# Patient Record
Sex: Female | Born: 1950 | Race: White | Hispanic: No | Marital: Married | Smoking: Never smoker
Health system: Southern US, Community
[De-identification: ages and names within clinical notes are randomized; demographics above are authoritative.]

## PROBLEM LIST (undated history)

## (undated) DIAGNOSIS — E7800 Pure hypercholesterolemia, unspecified: Secondary | ICD-10-CM

## (undated) DIAGNOSIS — I10 Essential (primary) hypertension: Secondary | ICD-10-CM

## (undated) DIAGNOSIS — Z9289 Personal history of other medical treatment: Secondary | ICD-10-CM

## (undated) DIAGNOSIS — E785 Hyperlipidemia, unspecified: Secondary | ICD-10-CM

## (undated) DIAGNOSIS — C801 Malignant (primary) neoplasm, unspecified: Secondary | ICD-10-CM

## (undated) DIAGNOSIS — I739 Peripheral vascular disease, unspecified: Secondary | ICD-10-CM

## (undated) HISTORY — PX: DILATION AND CURETTAGE OF UTERUS: SHX78

## (undated) HISTORY — DX: Essential (primary) hypertension: I10

## (undated) HISTORY — PX: COLONOSCOPY: SHX174

## (undated) HISTORY — DX: Hyperlipidemia, unspecified: E78.5

## (undated) HISTORY — PX: CATARACT EXTRACTION: SUR2

## (undated) HISTORY — DX: Pure hypercholesterolemia, unspecified: E78.00

---

## 2009-10-10 ENCOUNTER — Other Ambulatory Visit: Admission: RE | Admit: 2009-10-10 | Discharge: 2009-10-10 | Payer: Self-pay | Admitting: Family Medicine

## 2010-11-19 ENCOUNTER — Encounter (INDEPENDENT_AMBULATORY_CARE_PROVIDER_SITE_OTHER): Payer: Self-pay | Admitting: Surgery

## 2010-11-19 ENCOUNTER — Ambulatory Visit (INDEPENDENT_AMBULATORY_CARE_PROVIDER_SITE_OTHER): Payer: Medicare HMO | Admitting: Surgery

## 2010-11-19 DIAGNOSIS — R599 Enlarged lymph nodes, unspecified: Secondary | ICD-10-CM

## 2010-11-19 NOTE — Progress Notes (Signed)
Subjective:     Patient ID: Katie Hawkins, female   DOB: 1951/04/05, 60 y.o.   MRN: [id number]    BP 178/117  Pulse 120  Temp 97 F (36.1 C)  Ht 5\' 3"  (1.6 m)  Wt 193 lb 12.8 oz (87.907 kg)  BMI 34.33 kg/m2  HPI Patient is a 60 year old white female. She presents at the request of Dr. Elige Radon and [name] for left axillary lymphadenopathy. Patient had undergone a routine screening mammogram on June 18. Abnormal lymph nodes were identified. Patient returned for mammogram and ultrasound confirming lymphadenopathy in the left axilla. Patient underwent fine-needle aspiration and core needle biopsy on June 20. Pathology results showed atypical lymphoproliferative process suspicious for lymphoma. Excisional biopsy of lymph node is requested.  Patient does give a history of cats and frequent bites and scratches from these cats. She is concerned about possible cat scratch disease.  Review of Systems Patient denies any recent weight changes or night sweats. She has had no other recent health concerns. She has not noted any areas of swollen lymph nodes or discomfort.    Objective:   Physical Exam 60 year old moderately obese white female, anxious  HEENT: Normocephalic. Sclerae clear. Conjunctiva clear. Dentition fair. Mucous membranes moist. It was normal. Neck: Symmetric. No anterior or posterior cervical lymphadenopathy. No palpable thyroid nodules. Chest clear to auscultation bilaterally without rales rhonchi or wheeze Cardiac: Regular rate and rhythm without murmur. Peripheral pulses full. Extremities: Palpation in the left axilla shows at least one or 2 enlarged lymph nodes measuring approximately 3 cm in diameter. These were normal. There are nontender. Palpation in the right axilla shows one to 2 cm lymph node which are mobile and nontender. Neurologic: No focal neurologic deficits. No tremor.    Assessment:     Left axillary lymphadenopathy of undetermined etiology, biopsy  showing atypical lymphoproliferative process suspicious for lymphoma    Plan:     I discussed left axillary lymph node excisional biopsy with the patient and her husband at length. We discussed risk and benefits of the procedure including the possibility of lymph leak and seroma formation. We discussed doing this as an outpatient procedure in the near future. They are in agreement and we will proceed with surgery as soon as the operating room schedule will allow.

## 2010-11-22 ENCOUNTER — Other Ambulatory Visit (INDEPENDENT_AMBULATORY_CARE_PROVIDER_SITE_OTHER): Payer: Self-pay | Admitting: Surgery

## 2010-11-22 ENCOUNTER — Ambulatory Visit (HOSPITAL_COMMUNITY)
Admission: RE | Admit: 2010-11-22 | Discharge: 2010-11-22 | Disposition: A | Discharge status: Home / Self Care | Payer: Managed Care, Other (non HMO) | Source: Ambulatory Visit | Attending: Surgery | Admitting: Surgery

## 2010-11-22 ENCOUNTER — Encounter (HOSPITAL_COMMUNITY): Payer: Managed Care, Other (non HMO)

## 2010-11-22 DIAGNOSIS — Z01818 Encounter for other preprocedural examination: Secondary | ICD-10-CM

## 2010-11-22 DIAGNOSIS — R599 Enlarged lymph nodes, unspecified: Secondary | ICD-10-CM | POA: Insufficient documentation

## 2010-11-22 DIAGNOSIS — Z01812 Encounter for preprocedural laboratory examination: Secondary | ICD-10-CM | POA: Insufficient documentation

## 2010-11-22 DIAGNOSIS — R9431 Abnormal electrocardiogram [ECG] [EKG]: Secondary | ICD-10-CM | POA: Insufficient documentation

## 2010-11-22 DIAGNOSIS — Z7982 Long term (current) use of aspirin: Secondary | ICD-10-CM | POA: Insufficient documentation

## 2010-11-22 DIAGNOSIS — E119 Type 2 diabetes mellitus without complications: Secondary | ICD-10-CM | POA: Insufficient documentation

## 2010-11-22 DIAGNOSIS — Z79899 Other long term (current) drug therapy: Secondary | ICD-10-CM | POA: Insufficient documentation

## 2010-11-22 DIAGNOSIS — I10 Essential (primary) hypertension: Secondary | ICD-10-CM | POA: Insufficient documentation

## 2010-11-22 LAB — CBC
Hemoglobin: 14.2 g/dL (ref 12.0–15.0)
MCHC: 33.7 g/dL (ref 30.0–36.0)
RBC: 4.79 MIL/uL (ref 3.87–5.11)

## 2010-11-22 LAB — COMPREHENSIVE METABOLIC PANEL
ALT: 21 U/L (ref 0–35)
Alkaline Phosphatase: 78 U/L (ref 39–117)
GFR calc Af Amer: >60 mL/min (ref >60)
Glucose, Bld: 187 mg/dL — ABNORMAL HIGH (ref 70–99)
Potassium: 4.1 mEq/L (ref 3.5–5.1)
Sodium: 136 mEq/L (ref 135–145)
Total Protein: 7.3 g/dL (ref 6.0–8.3)

## 2010-11-26 ENCOUNTER — Ambulatory Visit (HOSPITAL_COMMUNITY)
Admission: RE | Admit: 2010-11-26 | Discharge: 2010-11-26 | Disposition: A | Discharge status: Home / Self Care | Payer: Managed Care, Other (non HMO) | Source: Ambulatory Visit | Attending: Surgery | Admitting: Surgery

## 2010-11-26 ENCOUNTER — Other Ambulatory Visit (INDEPENDENT_AMBULATORY_CARE_PROVIDER_SITE_OTHER): Payer: Self-pay | Admitting: Surgery

## 2010-11-26 DIAGNOSIS — R599 Enlarged lymph nodes, unspecified: Secondary | ICD-10-CM | POA: Insufficient documentation

## 2010-11-26 DIAGNOSIS — Z01810 Encounter for preprocedural cardiovascular examination: Secondary | ICD-10-CM | POA: Insufficient documentation

## 2010-11-26 DIAGNOSIS — C8190 Hodgkin lymphoma, unspecified, unspecified site: Secondary | ICD-10-CM | POA: Insufficient documentation

## 2010-11-26 DIAGNOSIS — Z01818 Encounter for other preprocedural examination: Secondary | ICD-10-CM | POA: Insufficient documentation

## 2010-11-26 DIAGNOSIS — C8194 Hodgkin lymphoma, unspecified, lymph nodes of axilla and upper limb: Secondary | ICD-10-CM

## 2010-11-26 DIAGNOSIS — C8144 Lymphocyte-rich classical Hodgkin lymphoma, lymph nodes of axilla and upper limb: Secondary | ICD-10-CM | POA: Insufficient documentation

## 2010-11-26 DIAGNOSIS — E669 Obesity, unspecified: Secondary | ICD-10-CM | POA: Insufficient documentation

## 2010-11-26 DIAGNOSIS — E119 Type 2 diabetes mellitus without complications: Secondary | ICD-10-CM | POA: Insufficient documentation

## 2010-11-26 DIAGNOSIS — Z01812 Encounter for preprocedural laboratory examination: Secondary | ICD-10-CM | POA: Insufficient documentation

## 2010-11-26 LAB — GLUCOSE, CAPILLARY
Glucose-Capillary: 209 mg/dL — ABNORMAL HIGH (ref 70–99)
Glucose-Capillary: 161 mg/dL — ABNORMAL HIGH (ref 70–99)

## 2010-12-02 ENCOUNTER — Ambulatory Visit (INDEPENDENT_AMBULATORY_CARE_PROVIDER_SITE_OTHER): Payer: Self-pay | Admitting: Surgery

## 2010-12-03 ENCOUNTER — Encounter (INDEPENDENT_AMBULATORY_CARE_PROVIDER_SITE_OTHER): Payer: Self-pay | Admitting: Surgery

## 2010-12-03 ENCOUNTER — Ambulatory Visit (INDEPENDENT_AMBULATORY_CARE_PROVIDER_SITE_OTHER): Payer: Managed Care, Other (non HMO) | Admitting: Surgery

## 2010-12-03 VITALS — Temp 96.7°F | Ht 63.0 in | Wt 191.4 lb

## 2010-12-03 DIAGNOSIS — E118 Type 2 diabetes mellitus with unspecified complications: Secondary | ICD-10-CM | POA: Insufficient documentation

## 2010-12-03 DIAGNOSIS — E1169 Type 2 diabetes mellitus with other specified complication: Secondary | ICD-10-CM | POA: Insufficient documentation

## 2010-12-03 DIAGNOSIS — R599 Enlarged lymph nodes, unspecified: Secondary | ICD-10-CM

## 2010-12-03 DIAGNOSIS — E119 Type 2 diabetes mellitus without complications: Secondary | ICD-10-CM | POA: Insufficient documentation

## 2010-12-03 DIAGNOSIS — E7800 Pure hypercholesterolemia, unspecified: Secondary | ICD-10-CM

## 2010-12-03 NOTE — Progress Notes (Signed)
HISTORY: Patient returns for followup having undergone left axillary lymph node excision for diagnosis. Final pathology results are still pending as of today. Slides have been sent to the [facility] for evaluation.   PERTINENT REVIEW OF SYSTEMS: Patient denies any significant pain. She's had no drainage. She's had no fever.   EXAM: Left axilla has a 7 cm surgical incision which is healing uneventfully. No sign of cellulitis. Palpation shows no significant seroma. There is minimal tenderness.   IMPRESSION: Left axillary lymphadenopathy, final pathologic results pending   PLAN: I will contact the patient as soon as the pathologic results are available for review. She will begin applying topical creams her incision. She will return for a final wound check in 3 weeks.

## 2010-12-08 NOTE — Op Note (Signed)
  Katie Hawkins, Katie Hawkins              ACCOUNT NO.:  [id number]  MEDICAL RECORD NO.:  [id number]  LOCATION:  DAYL                         FACILITY:  [facility]  PHYSICIAN:  Earnstine Regal, MD      DATE OF BIRTH:  1950-08-22  DATE OF PROCEDURE:  11/26/2010                               OPERATIVE REPORT   PREOPERATIVE DIAGNOSIS:  Left axillary lymphadenopathy.  POSTOPERATIVE DIAGNOSIS:  Left axillary lymphadenopathy.  PROCEDURE:  Left axillary lymph node excisional biopsy.  SURGEON:  Earnstine Regal, MD, FACS  ANESTHESIA:  General.  ESTIMATED BLOOD LOSS:  Minimal.  PREPARATION:  ChloraPrep.  COMPLICATIONS:  None.  INDICATIONS:  The patient is a 60 year old white female referred by Dr. Elige Radon and [name] with newly diagnosed left axillary lymphadenopathy.  The patient had a routine screening mammogram on June 18th.  Abnormal lymph nodes were identified.  Fine-needle aspiration and core needle biopsy on June 20th showed atypical lymphoproliferative process, suspicious for lymphoma.  The patient now comes to surgery for excisional lymph node biopsy for definitive diagnosis.  BODY OF REPORT:  Procedure was done in OR #11 at the [facility].  The patient was brought to the operating room, placed in supine position on the operating room table.  Following administration of general anesthesia, the patient was positioned and then prepped and draped in the usual strict aseptic fashion.  After ascertaining that an adequate level of anesthesia had been achieved, a high left axillary incision was made with a #15 blade.  Dissection was carried through subcutaneous tissues and hemostasis obtained with the electrocautery.  Gelpi retractor was placed for exposure.  The clavipectoral fascia was incised at the lateral edge of the pectoralis minor muscle.  Axilla was entered.  There were multiple large lymph nodes.  A low situated 3 cm lymph node was isolated.  It was  gently dissected out.  Vascular pedicle was divided between hemostats and the entire lymph node mass was excised.  It was submitted in its entirety in saline fresh to Pathology for review.  Vascular pedicle was ligated with a 3-0 Vicryl ligature.  Good hemostasis was noted.  Subcutaneous tissues were closed with interrupted 3-0 Vicryl sutures in layers.  Skin was anesthetized with local anesthetic.  Skin edges were closed with running 4-0 Vicryl subcuticular suture.  Wound was washed and dried and benzoin and Steri-Strips were applied.  Sterile dressings were applied.  The patient was awakened from anesthesia and brought to the recovery room.  The patient tolerated the procedure well.   Earnstine Regal, MD, FACS     TMG/MEDQ  D:  11/26/2010  T:  11/27/2010  Job:  [id number]  cc:   Dr. Elige Radon [facility], M.D. [id number]  Electronically Signed by Armandina Gemma MD on 12/08/2010 10:26:10 AM

## 2010-12-11 ENCOUNTER — Telehealth (INDEPENDENT_AMBULATORY_CARE_PROVIDER_SITE_OTHER): Payer: Self-pay | Admitting: Surgery

## 2010-12-11 NOTE — Telephone Encounter (Signed)
Informed patient that the path has not been released.  Katie Hawkins in pathology stated it still has not returned from [place] .

## 2010-12-12 ENCOUNTER — Telehealth (INDEPENDENT_AMBULATORY_CARE_PROVIDER_SITE_OTHER): Payer: Self-pay | Admitting: Surgery

## 2010-12-13 ENCOUNTER — Telehealth (INDEPENDENT_AMBULATORY_CARE_PROVIDER_SITE_OTHER): Payer: Self-pay | Admitting: Surgery

## 2010-12-16 ENCOUNTER — Telehealth (INDEPENDENT_AMBULATORY_CARE_PROVIDER_SITE_OTHER): Payer: Self-pay | Admitting: General Surgery

## 2010-12-16 NOTE — Telephone Encounter (Signed)
PT CALLED SEVERAL TIMES TODAY RE PATHOLOGY RESULTS/ ALSO RECEIVED VOICE MESSAGE/ SLIDES WERE SENT TO [place] FOR REVIEW/ DR. SMIR IS WAITING FOR PHONE CALL FROM PATHOLOGIST IN [place]/ NO PATHOLOGY HAS BEEN RECEIVED AS OF YET PER [facility]. I HAVE CONVEYED THIS TO PATIENT. I HAVE SPOKEN TO [facility] 4 TIMES TODAY.

## 2010-12-17 ENCOUNTER — Telehealth (INDEPENDENT_AMBULATORY_CARE_PROVIDER_SITE_OTHER): Payer: Self-pay | Admitting: General Surgery

## 2010-12-17 NOTE — Telephone Encounter (Signed)
Patient calling for path results. I did not see in system, but saw that they were sent to [place]. Please advise when able.

## 2010-12-18 ENCOUNTER — Telehealth (INDEPENDENT_AMBULATORY_CARE_PROVIDER_SITE_OTHER): Payer: Self-pay | Admitting: General Surgery

## 2010-12-18 NOTE — Telephone Encounter (Signed)
I HAVE RECEIVED SEVERAL CALLS FROM [name] TODAY, LAST CALL AT 12:15PM RE HER PATHOLOGY REPORT. [facility] HAS YET TO RECEIVE FAXED RESULTS FROM PATHOLOGY AT [facility] [place]. I INFORMED PATIENT THAT I WOULD CONTINUE TO KEEP IN TOUCH WITH [facility] TODAY.

## 2010-12-18 NOTE — Telephone Encounter (Signed)
PATHOLOGY REPORT FAXED OVER FROM [facility]/ REPORT REVIEWED WITH DR. Clent Demark PER TELEPHONE CONVERSATION.  PATIENT NOTIFIED  OF RESULTS PER OK DR. GERKIN. DR. Harlow Asa REQUESTED REFERRAL TO DR. Annye Rusk. GY

## 2010-12-19 ENCOUNTER — Telehealth (INDEPENDENT_AMBULATORY_CARE_PROVIDER_SITE_OTHER): Payer: Self-pay

## 2010-12-19 ENCOUNTER — Other Ambulatory Visit (INDEPENDENT_AMBULATORY_CARE_PROVIDER_SITE_OTHER): Payer: Self-pay | Admitting: Surgery

## 2010-12-19 DIAGNOSIS — C8190 Hodgkin lymphoma, unspecified, unspecified site: Secondary | ICD-10-CM

## 2010-12-19 NOTE — Telephone Encounter (Signed)
Pt informed of appointment information faxed this morning to Dr. Azucena Freed  office.

## 2010-12-23 ENCOUNTER — Encounter (INDEPENDENT_AMBULATORY_CARE_PROVIDER_SITE_OTHER): Payer: Self-pay | Admitting: Surgery

## 2010-12-23 ENCOUNTER — Ambulatory Visit (INDEPENDENT_AMBULATORY_CARE_PROVIDER_SITE_OTHER): Payer: Managed Care, Other (non HMO) | Admitting: Surgery

## 2010-12-23 ENCOUNTER — Other Ambulatory Visit: Payer: Self-pay | Admitting: Oncology

## 2010-12-23 DIAGNOSIS — C8190 Hodgkin lymphoma, unspecified, unspecified site: Secondary | ICD-10-CM

## 2010-12-23 DIAGNOSIS — R599 Enlarged lymph nodes, unspecified: Secondary | ICD-10-CM

## 2010-12-23 NOTE — Progress Notes (Signed)
HISTORY: Returns for wound check.  Final path shows Hodgkin's lymphoma.    PERTINENT REVIEW OF SYSTEMS: No issues with wound.   EXAM: Left axillary wound well healed with minimal soft tissue swelling.     IMPRESSION: Hodgkin's lymphoma    PLAN: Consult with Dr. Beryle Beams next week.  Return as needed.

## 2010-12-26 ENCOUNTER — Other Ambulatory Visit (HOSPITAL_COMMUNITY): Payer: Managed Care, Other (non HMO)

## 2010-12-26 ENCOUNTER — Other Ambulatory Visit: Payer: Self-pay | Admitting: Oncology

## 2010-12-26 ENCOUNTER — Inpatient Hospital Stay (HOSPITAL_COMMUNITY): Admission: RE | Admit: 2010-12-26 | Payer: Managed Care, Other (non HMO) | Source: Ambulatory Visit

## 2010-12-26 ENCOUNTER — Encounter (HOSPITAL_BASED_OUTPATIENT_CLINIC_OR_DEPARTMENT_OTHER): Payer: Managed Care, Other (non HMO) | Admitting: Oncology

## 2010-12-26 DIAGNOSIS — C8190 Hodgkin lymphoma, unspecified, unspecified site: Secondary | ICD-10-CM

## 2010-12-26 LAB — CBC & DIFF AND RETIC
Basophils Absolute: 0 10*3/uL (ref 0.0–0.1)
Eosinophils Absolute: 0.1 10*3/uL (ref 0.0–0.5)
HCT: 40.9 % (ref 34.8–46.6)
HGB: 13.7 g/dL (ref 11.6–15.9)
Immature Retic Fract: 8.5 % (ref 0.00–10.70)
MONO#: 0.7 10*3/uL (ref 0.1–0.9)
NEUT#: 6 10*3/uL (ref 1.5–6.5)
NEUT%: 65.7 % (ref 38.4–76.8)
RDW: 13 % (ref 11.2–14.5)
Retic %: 1.63 % — ABNORMAL HIGH (ref 0.50–1.50)
Retic Ct Abs: 75.47 10*3/uL — ABNORMAL HIGH (ref 18.30–72.70)
WBC: 9.2 10*3/uL (ref 3.9–10.3)
lymph#: 2.3 10*3/uL (ref 0.9–3.3)

## 2010-12-26 LAB — CMP (CANCER CENTER ONLY)
AST: 22 U/L (ref 11–38)
Albumin: 3.9 g/dL (ref 3.3–5.5)
Alkaline Phosphatase: 58 U/L (ref 26–84)
BUN, Bld: 12 mg/dL (ref 7–22)
Calcium: 9.3 mg/dL (ref 8.0–10.3)
Creat: 0.5 mg/dl — ABNORMAL LOW (ref 0.6–1.2)
Glucose, Bld: 189 mg/dL — ABNORMAL HIGH (ref 73–118)

## 2010-12-26 LAB — MORPHOLOGY: PLT EST: ADEQUATE

## 2010-12-27 ENCOUNTER — Encounter (HOSPITAL_COMMUNITY)
Admission: RE | Admit: 2010-12-27 | Discharge: 2010-12-27 | Disposition: A | Discharge status: Home / Self Care | Payer: Managed Care, Other (non HMO) | Source: Ambulatory Visit | Attending: Oncology | Admitting: Oncology

## 2010-12-27 ENCOUNTER — Encounter (HOSPITAL_COMMUNITY): Payer: Self-pay

## 2010-12-27 DIAGNOSIS — C8190 Hodgkin lymphoma, unspecified, unspecified site: Secondary | ICD-10-CM | POA: Insufficient documentation

## 2010-12-27 DIAGNOSIS — K8020 Calculus of gallbladder without cholecystitis without obstruction: Secondary | ICD-10-CM | POA: Insufficient documentation

## 2010-12-27 DIAGNOSIS — K449 Diaphragmatic hernia without obstruction or gangrene: Secondary | ICD-10-CM | POA: Insufficient documentation

## 2010-12-27 HISTORY — DX: Malignant (primary) neoplasm, unspecified: C80.1

## 2010-12-27 MED ORDER — IOHEXOL 300 MG/ML  SOLN: 125.0000 mL | Freq: Once | INTRAMUSCULAR | Status: AC | PRN

## 2010-12-27 MED ORDER — IOHEXOL 300 MG/ML  SOLN
125.0000 mL | Freq: Once | INTRAMUSCULAR | Status: AC | PRN
  Administered 2010-12-27: 125 mL via INTRAVENOUS

## 2010-12-27 MED ORDER — FLUDEOXYGLUCOSE F - 18 (FDG) INJECTION
16.3000 | Freq: Once | INTRAVENOUS | Status: AC | PRN
  Administered 2010-12-27: 16.3 via INTRAVENOUS

## 2010-12-30 ENCOUNTER — Encounter (HOSPITAL_BASED_OUTPATIENT_CLINIC_OR_DEPARTMENT_OTHER): Payer: Managed Care, Other (non HMO) | Admitting: Oncology

## 2011-01-01 LAB — CYTOMEGALOVIRUS ANTIBODY, IGG: Cytomegalovirus Ab-IgG: 0 (ref <0.90)

## 2011-01-01 LAB — BETA 2 MICROGLOBULIN, SERUM: Beta-2 Microglobulin: 1.68 mg/L (ref 1.01–1.73)

## 2011-01-01 LAB — URIC ACID: Uric Acid, Serum: 6.3 mg/dL (ref 2.4–7.0)

## 2011-01-01 LAB — LACTATE DEHYDROGENASE: LDH: 121 U/L (ref 94–250)

## 2011-01-01 LAB — SEDIMENTATION RATE: Sed Rate: 8 mm/hr (ref 0–22)

## 2011-01-01 LAB — HEPATITIS PANEL, ACUTE
Hep A IgM: NEGATIVE
Hep B C IgM: NEGATIVE
Hepatitis B Surface Ag: NEGATIVE

## 2011-01-01 LAB — EPSTEIN-BARR VIRUS EARLY D ANTIGEN ANTIBODY, IGG: EBV EA IgG: 0.34 {ISR}

## 2011-01-01 LAB — EPSTEIN-BARR VIRUS VCA, IGM: EBV VCA IgM: 0.05 {ISR}

## 2011-01-02 ENCOUNTER — Encounter (HOSPITAL_COMMUNITY): Payer: Managed Care, Other (non HMO)

## 2011-01-02 ENCOUNTER — Encounter (INDEPENDENT_AMBULATORY_CARE_PROVIDER_SITE_OTHER): Payer: Self-pay | Admitting: Surgery

## 2011-01-02 ENCOUNTER — Inpatient Hospital Stay (HOSPITAL_COMMUNITY): Admission: RE | Admit: 2011-01-02 | Payer: Managed Care, Other (non HMO) | Source: Ambulatory Visit

## 2011-01-08 ENCOUNTER — Ambulatory Visit
Admission: RE | Admit: 2011-01-08 | Discharge: 2011-01-08 | Disposition: A | Discharge status: Home / Self Care | Payer: Managed Care, Other (non HMO) | Source: Ambulatory Visit | Attending: Radiation Oncology | Admitting: Radiation Oncology

## 2011-01-08 ENCOUNTER — Encounter: Payer: Managed Care, Other (non HMO) | Admitting: Oncology

## 2011-01-08 DIAGNOSIS — I10 Essential (primary) hypertension: Secondary | ICD-10-CM | POA: Insufficient documentation

## 2011-01-08 DIAGNOSIS — Z8051 Family history of malignant neoplasm of kidney: Secondary | ICD-10-CM | POA: Insufficient documentation

## 2011-01-08 DIAGNOSIS — Z806 Family history of leukemia: Secondary | ICD-10-CM | POA: Insufficient documentation

## 2011-01-08 DIAGNOSIS — C8194 Hodgkin lymphoma, unspecified, lymph nodes of axilla and upper limb: Secondary | ICD-10-CM | POA: Insufficient documentation

## 2011-01-08 DIAGNOSIS — E119 Type 2 diabetes mellitus without complications: Secondary | ICD-10-CM | POA: Insufficient documentation

## 2011-01-08 DIAGNOSIS — Z8052 Family history of malignant neoplasm of bladder: Secondary | ICD-10-CM | POA: Insufficient documentation

## 2011-01-08 DIAGNOSIS — Z510 Encounter for antineoplastic radiation therapy: Secondary | ICD-10-CM | POA: Insufficient documentation

## 2011-01-08 DIAGNOSIS — K219 Gastro-esophageal reflux disease without esophagitis: Secondary | ICD-10-CM | POA: Insufficient documentation

## 2011-01-08 DIAGNOSIS — Z801 Family history of malignant neoplasm of trachea, bronchus and lung: Secondary | ICD-10-CM | POA: Insufficient documentation

## 2011-01-08 DIAGNOSIS — Z79899 Other long term (current) drug therapy: Secondary | ICD-10-CM | POA: Insufficient documentation

## 2011-01-08 DIAGNOSIS — Z803 Family history of malignant neoplasm of breast: Secondary | ICD-10-CM | POA: Insufficient documentation

## 2011-02-10 ENCOUNTER — Encounter (HOSPITAL_BASED_OUTPATIENT_CLINIC_OR_DEPARTMENT_OTHER): Payer: Managed Care, Other (non HMO) | Admitting: Oncology

## 2011-02-10 ENCOUNTER — Other Ambulatory Visit: Payer: Self-pay | Admitting: Oncology

## 2011-02-10 DIAGNOSIS — C8190 Hodgkin lymphoma, unspecified, unspecified site: Secondary | ICD-10-CM

## 2011-02-10 DIAGNOSIS — C773 Secondary and unspecified malignant neoplasm of axilla and upper limb lymph nodes: Secondary | ICD-10-CM

## 2011-02-10 DIAGNOSIS — C8590 Non-Hodgkin lymphoma, unspecified, unspecified site: Secondary | ICD-10-CM

## 2011-03-27 ENCOUNTER — Ambulatory Visit
Admission: RE | Admit: 2011-03-27 | Discharge: 2011-03-27 | Disposition: A | Discharge status: Home / Self Care | Payer: Managed Care, Other (non HMO) | Source: Ambulatory Visit | Attending: Radiation Oncology | Admitting: Radiation Oncology

## 2011-04-04 ENCOUNTER — Telehealth: Payer: Self-pay | Admitting: Oncology

## 2011-04-07 ENCOUNTER — Other Ambulatory Visit: Payer: Self-pay | Admitting: Oncology

## 2011-04-07 ENCOUNTER — Ambulatory Visit (HOSPITAL_COMMUNITY)
Admission: RE | Admit: 2011-04-07 | Discharge: 2011-04-07 | Disposition: A | Discharge status: Home / Self Care | Payer: Managed Care, Other (non HMO) | Source: Ambulatory Visit | Attending: Oncology | Admitting: Oncology

## 2011-04-07 ENCOUNTER — Other Ambulatory Visit (HOSPITAL_COMMUNITY): Payer: Managed Care, Other (non HMO)

## 2011-04-07 ENCOUNTER — Encounter (HOSPITAL_COMMUNITY): Payer: Self-pay

## 2011-04-07 ENCOUNTER — Other Ambulatory Visit (HOSPITAL_BASED_OUTPATIENT_CLINIC_OR_DEPARTMENT_OTHER): Payer: Managed Care, Other (non HMO) | Admitting: Lab

## 2011-04-07 ENCOUNTER — Telehealth: Payer: Self-pay | Admitting: Oncology

## 2011-04-07 DIAGNOSIS — C8190 Hodgkin lymphoma, unspecified, unspecified site: Secondary | ICD-10-CM

## 2011-04-07 DIAGNOSIS — I658 Occlusion and stenosis of other precerebral arteries: Secondary | ICD-10-CM | POA: Insufficient documentation

## 2011-04-07 DIAGNOSIS — I2510 Atherosclerotic heart disease of native coronary artery without angina pectoris: Secondary | ICD-10-CM | POA: Insufficient documentation

## 2011-04-07 DIAGNOSIS — I6529 Occlusion and stenosis of unspecified carotid artery: Secondary | ICD-10-CM | POA: Insufficient documentation

## 2011-04-07 DIAGNOSIS — K8020 Calculus of gallbladder without cholecystitis without obstruction: Secondary | ICD-10-CM | POA: Insufficient documentation

## 2011-04-07 DIAGNOSIS — C8590 Non-Hodgkin lymphoma, unspecified, unspecified site: Secondary | ICD-10-CM

## 2011-04-07 DIAGNOSIS — Z923 Personal history of irradiation: Secondary | ICD-10-CM | POA: Insufficient documentation

## 2011-04-07 LAB — CMP (CANCER CENTER ONLY)
AST: 19 U/L (ref 11–38)
Albumin: 3.7 g/dL (ref 3.3–5.5)
Alkaline Phosphatase: 54 U/L (ref 26–84)
BUN, Bld: 12 mg/dL (ref 7–22)
Calcium: 9.3 mg/dL (ref 8.0–10.3)
Chloride: 93 mEq/L — ABNORMAL LOW (ref 98–108)
Glucose, Bld: 218 mg/dL — ABNORMAL HIGH (ref 73–118)
Potassium: 3.9 mEq/L (ref 3.3–4.7)
Sodium: 142 mEq/L (ref 128–145)
Total Protein: 6.9 g/dL (ref 6.4–8.1)

## 2011-04-07 LAB — CBC WITH DIFFERENTIAL/PLATELET
Basophils Absolute: 0 10*3/uL (ref 0.0–0.1)
EOS%: 1.7 % (ref 0.0–7.0)
Eosinophils Absolute: 0.1 10*3/uL (ref 0.0–0.5)
HGB: 13.4 g/dL (ref 11.6–15.9)
MONO%: 9.8 % (ref 0.0–14.0)
NEUT#: 4.5 10*3/uL (ref 1.5–6.5)
RBC: 4.31 10*6/uL (ref 3.70–5.45)
RDW: 12.7 % (ref 11.2–14.5)
lymph#: 0.9 10*3/uL (ref 0.9–3.3)

## 2011-04-07 LAB — SEDIMENTATION RATE: Sed Rate: 11 mm/hr (ref 0–22)

## 2011-04-07 MED ORDER — IOHEXOL 300 MG/ML  SOLN
80.0000 mL | Freq: Once | INTRAMUSCULAR | Status: AC | PRN
  Administered 2011-04-07: 80 mL via INTRAVENOUS

## 2011-04-07 NOTE — Telephone Encounter (Signed)
Called pt to r/s appt cancelled due to [facility]

## 2011-04-08 ENCOUNTER — Telehealth: Payer: Self-pay

## 2011-04-08 NOTE — Telephone Encounter (Signed)
Message left on personalized VM for pt to contact our office for lab/CT results.    Per Dr Beryle Beams - "lab & CT OK, except glucose 218."  dph

## 2011-04-08 NOTE — Telephone Encounter (Signed)
Pt returned call for lab & CT results.  Pt verbalizes understanding & appreciation.  Results forwarded to Dr Carol Ada per Pt request. dph

## 2011-05-06 ENCOUNTER — Other Ambulatory Visit: Payer: Self-pay | Admitting: Dermatology

## 2011-06-03 ENCOUNTER — Telehealth: Payer: Self-pay | Admitting: Oncology

## 2011-06-03 ENCOUNTER — Ambulatory Visit (HOSPITAL_BASED_OUTPATIENT_CLINIC_OR_DEPARTMENT_OTHER): Payer: Managed Care, Other (non HMO) | Admitting: Oncology

## 2011-06-03 VITALS — BP 130/91 | HR 103 | Temp 97.5°F | Ht 63.0 in | Wt 192.7 lb

## 2011-06-03 DIAGNOSIS — C8190 Hodgkin lymphoma, unspecified, unspecified site: Secondary | ICD-10-CM

## 2011-06-03 DIAGNOSIS — D649 Anemia, unspecified: Secondary | ICD-10-CM

## 2011-06-03 NOTE — Progress Notes (Signed)
Hematology and Oncology Follow Up Visit  Katie Hawkins [id number] [birth year] 61 y.o. 06/03/2011 8:35 PM   Principle Diagnosis: Encounter Diagnosis  Name Primary?  . Hodgkin's disease, stage IA Yes     Interim History:   Followup visit for this 61 year old woman incidentally found to have asymptomatic Lymphadenopathy in the left axilla on a routine mammogram done in August 2012. She had a number of nondiagnostic needle biopsy and ultimately an incisional biopsy on 11/26/10. 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 Staging evaluation was unremarkable. PET scan showed some minimal activity in the left axilla consistent with postsurgical inflammation. I reviewed the literature and it appears that for such limited disease with his histologic subtype, long-term results are seen with radiation alone. She was referred to radiation oncology and received 36 gray in 20 fractions between 8/29 and 02/21/11 by Dr. Thea Silversmith. She tolerated treatment very well. She's had no interim medical problems. She never had any constitutional symptoms and has none at this time either.  Medications: reviewed  Allergies: No Known Allergies  Review of Systems: Constitutional:   None Respiratory: No dyspnea or cough Cardiovascular:  No chest pain chest pressure or palpitations Gastrointestinal: No abdominal pain or change in bowel habit Genito-Urinary: Not questioned Musculoskeletal: No bone Neurologic: No headache or change in vision Skin: Remaining ROS negative.  Physical Exam: Blood pressure 130/91, pulse 103, temperature 97.5 F (36.4 C), temperature source Oral, height 5\' 3"  (1.6 m), weight 192 lb 11.2 oz (87.408 kg). Wt Readings from Last 3 Encounters:  06/03/11 192 lb 11.2 oz (87.408 kg)  12/03/10 191 lb 6.4 oz (86.818 kg)  11/19/10 193 lb 12.8 oz (87.907 kg)     General  appearance: Overweight Caucasian woman Head: Normal Neck: Normal Lymph nodes: No cervical supraclavicular or axillary adenopathy Breasts: Not examined Lungs: Clear to auscultation resonant to percussion Heart: Your cardiac rhythm no murmur Abdomen: Soft obese nontender no mass no obvious organomegaly Extremities: No edema Vascular: No cyanosis Neurologic: Grossly normal Skin: No rash or ecchymosis  Lab Results: Lab Results  Component Value Date   WBC 6.2 04/07/2011   HGB 13.4 04/07/2011   HCT 39.5 04/07/2011   MCV 91.5 04/07/2011   PLT 254 04/07/2011     Chemistry      Component Value Date/Time   NA 142 04/07/2011 0835   NA 136 11/22/2010 1515   K 3.9 04/07/2011 0835   K 4.1 11/22/2010 1515   CL 93* 04/07/2011 0835   CL 99 11/22/2010 1515   CO2 29 04/07/2011 0835   CO2 27 11/22/2010 1515   BUN 12 04/07/2011 0835   BUN 11 11/22/2010 1515   CREATININE 0.5* 04/07/2011 0835   CREATININE 0.66 11/22/2010 1515      Component Value Date/Time   CALCIUM 9.3 04/07/2011 0835   CALCIUM 9.9 11/22/2010 1515   ALKPHOS 54 04/07/2011 0835   ALKPHOS 78 11/22/2010 1515   AST 19 04/07/2011 0835   AST 20 11/22/2010 1515   ALT 21 11/22/2010 1515   BILITOT 0.50 04/07/2011 0835   BILITOT 0.2* 11/22/2010 1515       Radiological Studies: CT scan of the chest which I personally reviewed with her today show persistent changes in the soft tissues of the left axilla is stable compared with the postsurgical CT and PET of August 2012 "favored to represent postoperative or biopsy related scarring. No underlying adenopathy".  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 Treated with primary radiation as outlined above. No evidence for new disease at this time. Plan: I will get a followup CT scan at a six-month interval. If this is stable then I think we can just follow her with the clinical exam and chest radiographs. She is encouraged to stay on schedule for her  mammogram in this June.   Cc Dr Carol Ada; Lance Morin; Katie Bounds, MD 1/8/20138:35 PM

## 2011-06-03 NOTE — Telephone Encounter (Signed)
Lab and ct made for 5/8 and f/u with lisa on 5/15,printed for pt   aom

## 2011-08-05 ENCOUNTER — Telehealth: Payer: Self-pay | Admitting: Oncology

## 2011-08-05 NOTE — Telephone Encounter (Signed)
S/w the pt regarding her r/s appt from 10/08/2011 to 10/13/2011 due to the pa is out of the office that week

## 2011-10-01 ENCOUNTER — Ambulatory Visit (HOSPITAL_COMMUNITY)
Admission: RE | Admit: 2011-10-01 | Discharge: 2011-10-01 | Disposition: A | Discharge status: Home / Self Care | Payer: Managed Care, Other (non HMO) | Source: Ambulatory Visit | Attending: Oncology | Admitting: Oncology

## 2011-10-01 ENCOUNTER — Telehealth: Payer: Self-pay | Admitting: *Deleted

## 2011-10-01 ENCOUNTER — Inpatient Hospital Stay (HOSPITAL_COMMUNITY): Admission: RE | Admit: 2011-10-01 | Payer: Managed Care, Other (non HMO) | Source: Ambulatory Visit

## 2011-10-01 ENCOUNTER — Other Ambulatory Visit: Payer: Self-pay | Admitting: Oncology

## 2011-10-01 ENCOUNTER — Encounter (HOSPITAL_COMMUNITY): Payer: Self-pay

## 2011-10-01 ENCOUNTER — Other Ambulatory Visit: Payer: Self-pay | Admitting: *Deleted

## 2011-10-01 ENCOUNTER — Other Ambulatory Visit (HOSPITAL_BASED_OUTPATIENT_CLINIC_OR_DEPARTMENT_OTHER): Payer: Managed Care, Other (non HMO)

## 2011-10-01 DIAGNOSIS — C8190 Hodgkin lymphoma, unspecified, unspecified site: Secondary | ICD-10-CM | POA: Insufficient documentation

## 2011-10-01 DIAGNOSIS — Z923 Personal history of irradiation: Secondary | ICD-10-CM | POA: Insufficient documentation

## 2011-10-01 DIAGNOSIS — K8020 Calculus of gallbladder without cholecystitis without obstruction: Secondary | ICD-10-CM | POA: Insufficient documentation

## 2011-10-01 DIAGNOSIS — I2510 Atherosclerotic heart disease of native coronary artery without angina pectoris: Secondary | ICD-10-CM | POA: Insufficient documentation

## 2011-10-01 LAB — CBC WITH DIFFERENTIAL/PLATELET
BASO%: 0.6 % (ref 0.0–2.0)
Basophils Absolute: 0 10*3/uL (ref 0.0–0.1)
EOS%: 1.1 % (ref 0.0–7.0)
HCT: 38.9 % (ref 34.8–46.6)
LYMPH%: 16.7 % (ref 14.0–49.7)
MCH: 30.4 pg (ref 25.1–34.0)
MCHC: 34.4 g/dL (ref 31.5–36.0)
MCV: 88.2 fL (ref 79.5–101.0)
MONO%: 11 % (ref 0.0–14.0)
NEUT%: 70.6 % (ref 38.4–76.8)
lymph#: 1.2 10*3/uL (ref 0.9–3.3)

## 2011-10-01 LAB — BASIC METABOLIC PANEL - CANCER CENTER ONLY
BUN, Bld: 16 mg/dL (ref 7–22)
CO2: 28 mEq/L (ref 18–33)
Chloride: 95 mEq/L — ABNORMAL LOW (ref 98–108)
Potassium: 4.1 mEq/L (ref 3.3–4.7)

## 2011-10-01 LAB — MORPHOLOGY: PLT EST: ADEQUATE

## 2011-10-01 LAB — SEDIMENTATION RATE: Sed Rate: 6 mm/hr (ref 0–22)

## 2011-10-01 MED ORDER — IOHEXOL 300 MG/ML  SOLN
100.0000 mL | Freq: Once | INTRAMUSCULAR | Status: AC | PRN
  Administered 2011-10-01: 100 mL via INTRAVENOUS

## 2011-10-01 NOTE — Telephone Encounter (Signed)
Message copied by Jesse Fall on Wed Oct 01, 2011  2:37 PM ------      Message from: Annia Belt      Created: Wed Oct 01, 2011  2:16 PM       Call pt CT negative

## 2011-10-01 NOTE — Telephone Encounter (Signed)
Message left on pt's vm regarding CT results per Dr. Beryle Beams.

## 2011-10-08 ENCOUNTER — Ambulatory Visit: Payer: Managed Care, Other (non HMO) | Admitting: Nurse Practitioner

## 2011-10-13 ENCOUNTER — Ambulatory Visit (HOSPITAL_BASED_OUTPATIENT_CLINIC_OR_DEPARTMENT_OTHER): Payer: Managed Care, Other (non HMO) | Admitting: Nurse Practitioner

## 2011-10-13 ENCOUNTER — Telehealth: Payer: Self-pay | Admitting: Oncology

## 2011-10-13 VITALS — BP 160/92 | HR 107 | Temp 97.0°F | Ht 63.0 in | Wt 198.1 lb

## 2011-10-13 DIAGNOSIS — C8190 Hodgkin lymphoma, unspecified, unspecified site: Secondary | ICD-10-CM

## 2011-10-13 NOTE — Telephone Encounter (Signed)
Gave pt appt for September 2013 , lab before MD

## 2011-10-13 NOTE — Progress Notes (Signed)
OFFICE PROGRESS NOTE  Interval history:  Katie Hawkins is a 61 year old woman diagnosed with nodular lymphocyte predominant Hodgkin's lymphoma July 2012 after an incidental finding of lymphadenopathy on a routine mammogram. Staging evaluation was unremarkable. PET scan showed minimal activity in the left axilla consistent with postsurgical inflammation. She completed radiation between 01/22/2011 and 02/21/2011.  Restaging CT scans of the neck and chest 10/01/2011 showed no evidence for residual or recurrent disease.  Katie Hawkins reports that she feels well. No fevers or sweats. She has a good appetite. No weight loss. She denies pain. No shortness of breath or cough. No bowel or bladder problems.  Objective: Blood pressure 160/92, pulse 107, temperature 97 F (36.1 C), temperature source Oral, height 5\' 3"  (1.6 m), weight 198 lb 1.6 oz (89.858 kg).  Oropharynx is without thrush or ulceration. No palpable cervical, supra clavicular, axillary or inguinal lymph nodes. Lungs are clear. No wheezes or rales. Regular cardiac rhythm. Abdomen is soft and nontender. Obese. No obvious organomegaly. Extremities are without edema. Calves are soft and nontender.  Lab Results: Lab Results  Component Value Date   WBC 7.1 10/01/2011   HGB 13.4 10/01/2011   HCT 38.9 10/01/2011   MCV 88.2 10/01/2011   PLT 267 10/01/2011    Chemistry:    Chemistry      Component Value Date/Time   NA 139 10/01/2011 0934   NA 136 11/22/2010 1515   K 4.1 10/01/2011 0934   K 4.1 11/22/2010 1515   CL 95* 10/01/2011 0934   CL 99 11/22/2010 1515   CO2 28 10/01/2011 0934   CO2 27 11/22/2010 1515   BUN 16 10/01/2011 0934   BUN 11 11/22/2010 1515   CREATININE 0.6 10/01/2011 0934   CREATININE 0.66 11/22/2010 1515      Component Value Date/Time   CALCIUM 9.2 10/01/2011 0934   CALCIUM 9.9 11/22/2010 1515   ALKPHOS 54 04/07/2011 0835   ALKPHOS 78 11/22/2010 1515   AST 19 04/07/2011 0835   AST 20 11/22/2010 1515   ALT 21 11/22/2010 1515   BILITOT 0.50  04/07/2011 0835   BILITOT 0.2* 11/22/2010 1515     Sedimentation rate 6 on 10/01/2011.  Studies/Results: Ct Soft Tissue Neck W Contrast  10/01/2011  *RADIOLOGY REPORT*  Clinical Data: Non-Hodgkins lymphoma.  Status post radiation therapy.  CT NECK WITH CONTRAST  Technique:  Multidetector CT imaging of the neck was performed with intravenous contrast.  Contrast: 158mL OMNIPAQUE IOHEXOL 300 MG/ML  SOLN  Comparison: CT of the neck 12/27/2010.  Findings: Small level II lymph nodes immediately posterior to the parotid gland bilaterally are stable relative to the prior exam. No new or enlarging lymph nodes are present. No focal mucosal or submucosal lesions are present.  The vocal cords are midline.  The thyroid is unremarkable.  The salivary glands are symmetric in size and density.  Mild narrowing of the left common carotid artery is evident.  No other definite stenosis is seen.  The bone windows demonstrate minimal endplate degenerative changes at C4-5, C5-6, and C6-7.  IMPRESSION:  1.  No evidence for residual or recurrent disease. 2.  Bilateral level II lymph nodes, immediately posterior to the parotid, are stable. 3.  Left common carotid artery atherosclerotic changes without significant stenosis. 4.  Borderline bilateral jugulodigastric lymph nodes.  Original Report Authenticated By: Resa Miner. MATTERN, M.D.   Ct Chest W Contrast  10/01/2011  *RADIOLOGY REPORT*  Clinical Data: Hodgkin's lymphoma.  Prior radiation.  Followup.  CT CHEST WITH  CONTRAST  Technique:  Multidetector CT imaging of the chest was performed following the standard protocol during bolus administration of intravenous contrast.  Contrast: 115mL OMNIPAQUE IOHEXOL 300 MG/ML  SOLN  Comparison: 04/07/2011  Findings: Lungs are clear.  No focal airspace opacities or suspicious nodules.  No effusions. Heart is normal size. Aorta is normal caliber. Age advanced coronary artery and aortic calcifications. No mediastinal, hilar, or axillary  adenopathy. Visualized thyroid and chest wall soft tissues unremarkable. Imaging into the upper abdomen shows no acute findings.  Gallstones are visualized within the gallbladder.  No acute bony abnormality.  IMPRESSION: No evidence of adenopathy within the chest.  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  Cholelithiasis.  Original Report Authenticated By: Raelyn Number, M.D.    Medications: I have reviewed the patient's current medications.  Assessment/Plan:  1. 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 2. Diabetes mellitus.  Disposition-Katie Hawkins appears stable. She remains in remission. She will return for a followup visit in 4 months. She is aware that she is due for her annual mammogram in June of this year and will contact the facility to schedule. She will contact our office prior to her next visit with any problems.  Ned Card ANP/GNP-BC

## 2011-10-16 ENCOUNTER — Ambulatory Visit: Payer: Managed Care, Other (non HMO) | Admitting: Nurse Practitioner

## 2012-02-11 ENCOUNTER — Other Ambulatory Visit (HOSPITAL_BASED_OUTPATIENT_CLINIC_OR_DEPARTMENT_OTHER): Payer: Managed Care, Other (non HMO) | Admitting: Lab

## 2012-02-11 DIAGNOSIS — C8190 Hodgkin lymphoma, unspecified, unspecified site: Secondary | ICD-10-CM

## 2012-02-11 LAB — SEDIMENTATION RATE: Sed Rate: 1 mm/hr (ref 0–22)

## 2012-02-11 LAB — COMPREHENSIVE METABOLIC PANEL (CC13)
AST: 17 U/L (ref 5–34)
Albumin: 4.1 g/dL (ref 3.5–5.0)
BUN: 13 mg/dL (ref 7.0–26.0)
Calcium: 9.9 mg/dL (ref 8.4–10.4)
Chloride: 103 mEq/L (ref 98–107)
Glucose: 157 mg/dl — ABNORMAL HIGH (ref 70–99)
Potassium: 4.1 mEq/L (ref 3.5–5.1)

## 2012-02-11 LAB — CBC WITH DIFFERENTIAL/PLATELET
Eosinophils Absolute: 0.1 10*3/uL (ref 0.0–0.5)
MONO#: 0.7 10*3/uL (ref 0.1–0.9)
MONO%: 10.9 % (ref 0.0–14.0)
NEUT#: 4.7 10*3/uL (ref 1.5–6.5)
RBC: 4.36 10*6/uL (ref 3.70–5.45)
RDW: 13.3 % (ref 11.2–14.5)
WBC: 6.8 10*3/uL (ref 3.9–10.3)
lymph#: 1.2 10*3/uL (ref 0.9–3.3)

## 2012-02-13 ENCOUNTER — Telehealth: Payer: Self-pay | Admitting: Oncology

## 2012-02-13 ENCOUNTER — Encounter: Payer: Self-pay | Admitting: Oncology

## 2012-02-13 ENCOUNTER — Ambulatory Visit (HOSPITAL_BASED_OUTPATIENT_CLINIC_OR_DEPARTMENT_OTHER): Payer: Managed Care, Other (non HMO) | Admitting: Oncology

## 2012-02-13 VITALS — BP 157/94 | HR 114 | Temp 99.1°F | Resp 20 | Ht 63.0 in | Wt 196.5 lb

## 2012-02-13 DIAGNOSIS — I10 Essential (primary) hypertension: Secondary | ICD-10-CM

## 2012-02-13 DIAGNOSIS — C8100 Nodular lymphocyte predominant Hodgkin lymphoma, unspecified site: Secondary | ICD-10-CM

## 2012-02-13 DIAGNOSIS — C8144 Lymphocyte-rich classical Hodgkin lymphoma, lymph nodes of axilla and upper limb: Secondary | ICD-10-CM

## 2012-02-13 DIAGNOSIS — I152 Hypertension secondary to endocrine disorders: Secondary | ICD-10-CM | POA: Insufficient documentation

## 2012-02-13 HISTORY — DX: Essential (primary) hypertension: I10

## 2012-02-13 NOTE — Telephone Encounter (Signed)
Gave pt appt for March 2014 lab, Ct then MD visit

## 2012-02-13 NOTE — Patient Instructions (Signed)
Continue routine follow up with Dr Darnell Level  Next CT scan 1 week before visit in March, 2014

## 2012-02-13 NOTE — Progress Notes (Signed)
Hematology and Oncology Follow Up Visit  Katie Hawkins [id number] 1950/12/29 61 y.o. 02/13/2012 6:02 PM   Principle Diagnosis: Encounter Diagnosis  Name Primary?  . Hodgkin's lymphoma, lymphocyte-predominant, stage IA Yes     Interim History:   Followup visit for this 61 year old woman referred here in August.  She was incidentally found to have left axillary adenopathy on a routine mammogram.  This led to a number of biopsies and ultimately a surgical excisional biopsy done by Dr. Armandina Gemma on 11/26/2010.  Pathology did not appear straightforward.  Second opinion was obtained at the [facility], and this was felt to be an area of nodular lymphocyte-predominant Hodgkin disease.  She underwent a staging evaluation including a PET scan on 12/27/2010 which was unremarkable except for some residual inflammatory change in the left axilla.  She was totally asymptomatic and therefore stage IA disease.  Bone marrow biopsy not indicated.   I reviewed the literature and found that nodular lymphocyte-predominant Hodgkin's only comprises 5% of all Hodgkin's.  It expresses CD20.  It behaves more like a low-grade B-cell non-Hodgkin lymphoma in that it is very indolent.  It can be treated with a local therapy with good outcome.  I referred her to Dr. Thea Silversmith, Radiation Oncology. She received involved field radiation with 36 gray in 1.8 grays fractions for 20 fractions to the left axilla and supraclavicular area between August 29 and 02/21/2011. She had a complete radiographic response.  She is doing well at this time. She has had no interim medical problems. She has not had any infections.  Medications: reviewed  Allergies: No Known Allergies  Review of Systems: Constitutional:   No constitutional symptoms Respiratory: No cough or dyspnea Cardiovascular:  No chest pain or palpitations Gastrointestinal: No abdominal pain or change in bowel habit Genito-Urinary: No  urinary tract symptoms Musculoskeletal: No muscle or bone pain Neurologic: No headache or change in vision Skin: No rash Remaining ROS negative.  Physical Exam: Blood pressure 157/94, pulse 114, temperature 99.1 F (37.3 C), temperature source Oral, resp. rate 20, height 5\' 3"  (1.6 m), weight 196 lb 8 oz (89.132 kg). Wt Readings from Last 3 Encounters:  02/13/12 196 lb 8 oz (89.132 kg)  10/13/11 198 lb 1.6 oz (89.858 kg)  06/03/11 192 lb 11.2 oz (87.408 kg)     General appearance: Well-nourished Caucasian woman HENNT: Pharynx no erythema or exudate Lymph nodes: No cervical, supraclavicular, axillary, or inguinal adenopathy Breasts: Not examined Lungs: Clear to auscultation resonant to percussion Heart: Regular rhythm no murmur Abdomen: Soft nontender no mass no organomegaly Extremities: No edema no calf tenderness Vascular: No cyanosis Neurologic: No focal deficit Skin: No rash or ecchymosis  Lab Results: Lab Results  Component Value Date   WBC 6.8 02/11/2012   HGB 13.6 02/11/2012   HCT 39.7 02/11/2012   MCV 91.2 02/11/2012   PLT 248 02/11/2012     Chemistry      Component Value Date/Time   NA 140 02/11/2012 0827   NA 139 10/01/2011 0934   NA 136 11/22/2010 1515   K 4.1 02/11/2012 0827   K 4.1 10/01/2011 0934   K 4.1 11/22/2010 1515   CL 103 02/11/2012 0827   CL 95* 10/01/2011 0934   CL 99 11/22/2010 1515   CO2 23 02/11/2012 0827   CO2 28 10/01/2011 0934   CO2 27 11/22/2010 1515   BUN 13.0 02/11/2012 0827   BUN 16 10/01/2011 0934   BUN 11 11/22/2010 1515  CREATININE 0.7 02/11/2012 0827   CREATININE 0.6 10/01/2011 0934   CREATININE 0.66 11/22/2010 1515      Component Value Date/Time   CALCIUM 9.9 02/11/2012 0827   CALCIUM 9.2 10/01/2011 0934   CALCIUM 9.9 11/22/2010 1515   ALKPHOS 64 02/11/2012 0827   ALKPHOS 54 04/07/2011 0835   ALKPHOS 78 11/22/2010 1515   AST 17 02/11/2012 0827   AST 19 04/07/2011 0835   AST 20 11/22/2010 1515   ALT 21 02/11/2012 0827   ALT 21 11/22/2010 1515    BILITOT 0.50 02/11/2012 0827   BILITOT 0.50 04/07/2011 0835   BILITOT 0.2* 11/22/2010 1515       Radiological Studies: Most recent CT scan of neck and chest on 10/01/2011 showed no active disease  Impression and Plan: #1. Stage IA lymphocyte predominant Hodgkin's lymphoma treated with primary radiation. No evidence for recurrent disease now one year from completion of radiation. I'll see her again in 6 months and we will get a followup CT at that time. I think a PET scan looks good that we can just follow her clinically.  #2. Essential hypertension  #3. Type 2 diabetes   CC:. [name]; Dr. Armandina Gemma; Dr. Lance Morin   Annia Belt, MD 9/20/20136:02 PM

## 2012-03-08 IMAGING — CR DG CHEST 2V
2 series · 2 of 2 positions shown · non-contrast
Comparison: None.

CLINICAL DATA: Preop for left axillary lymph node biopsy.  No chest
complaints. Hypertension.  Diabetic.  Nonsmoker.

CHEST - 2 VIEW

[w chest pa]
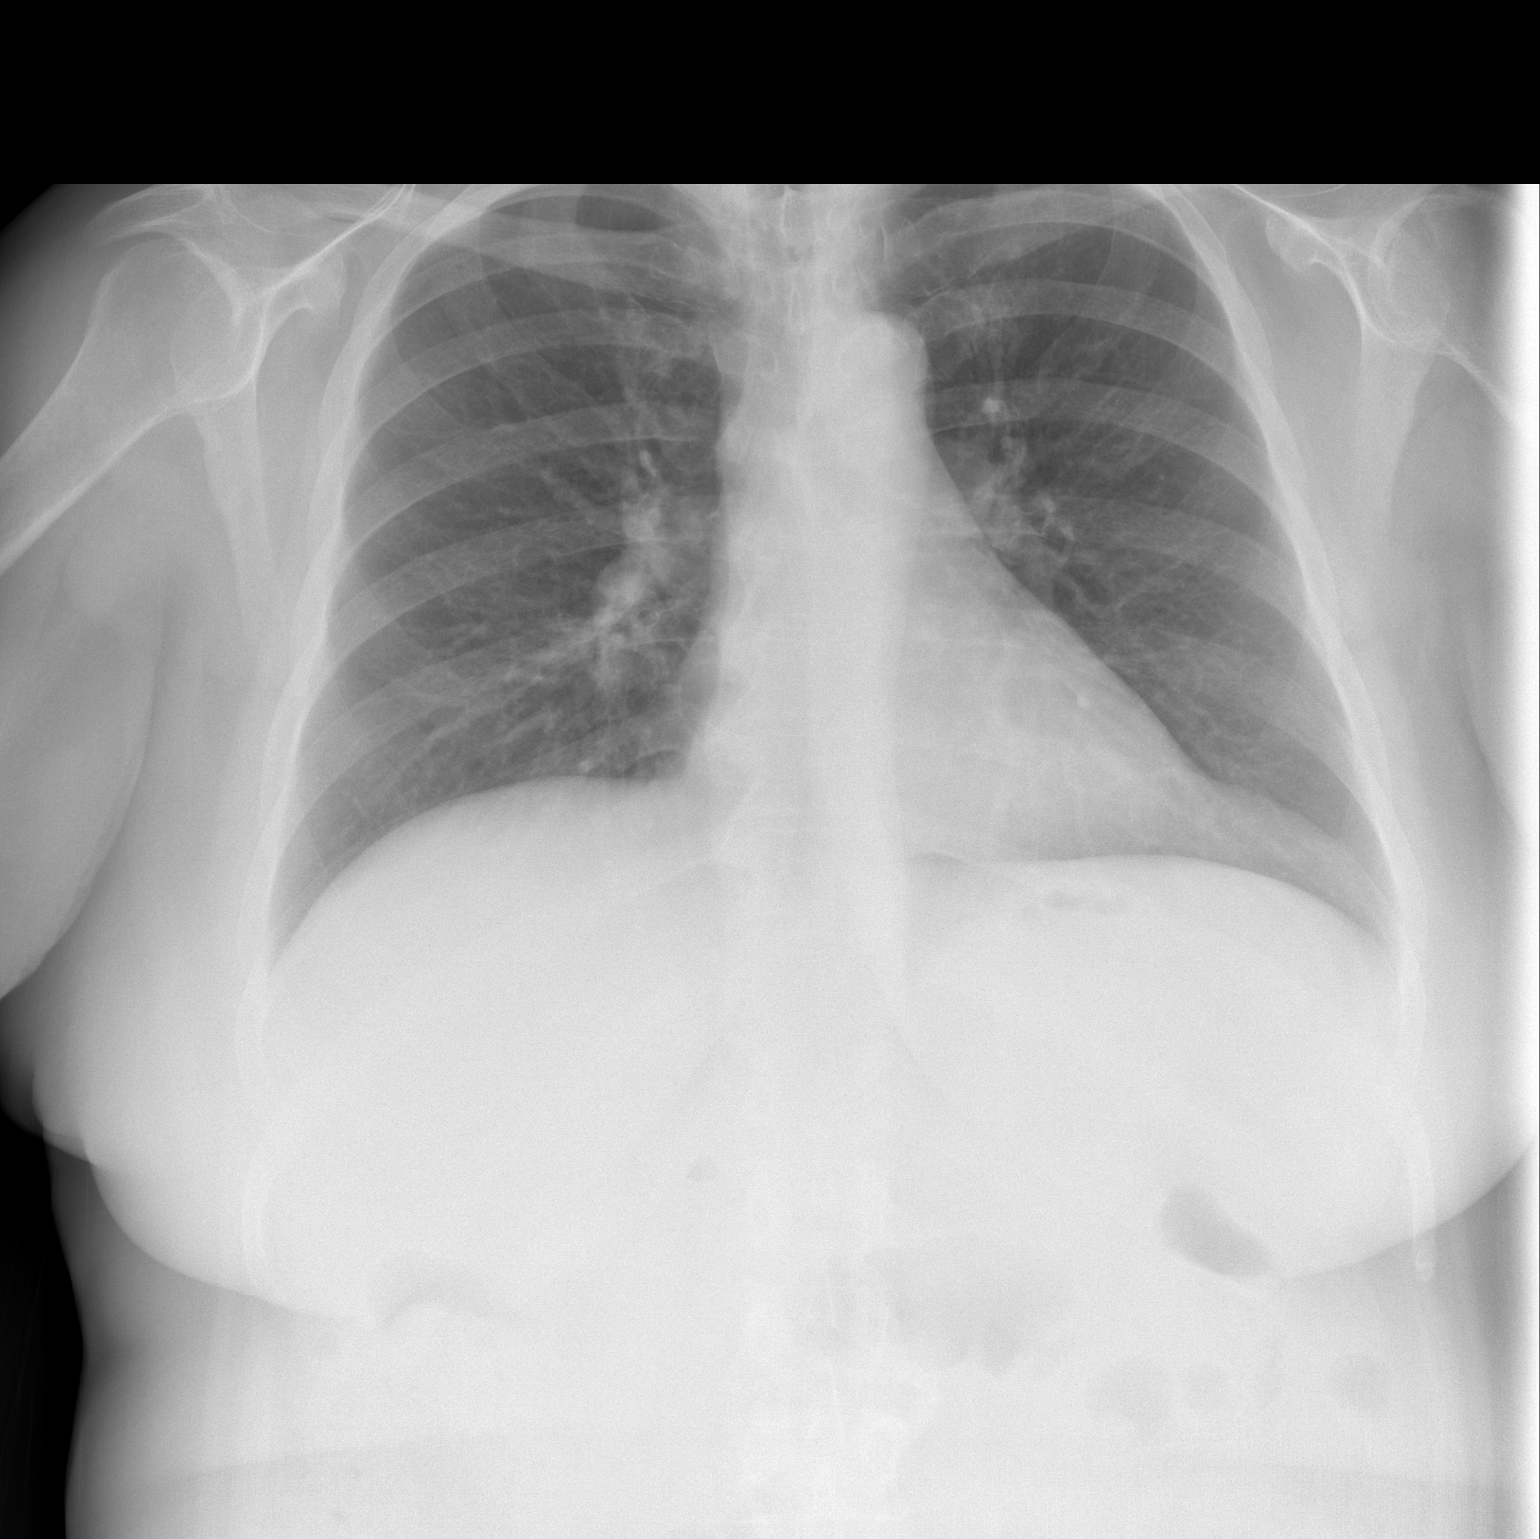

[w chest lat]
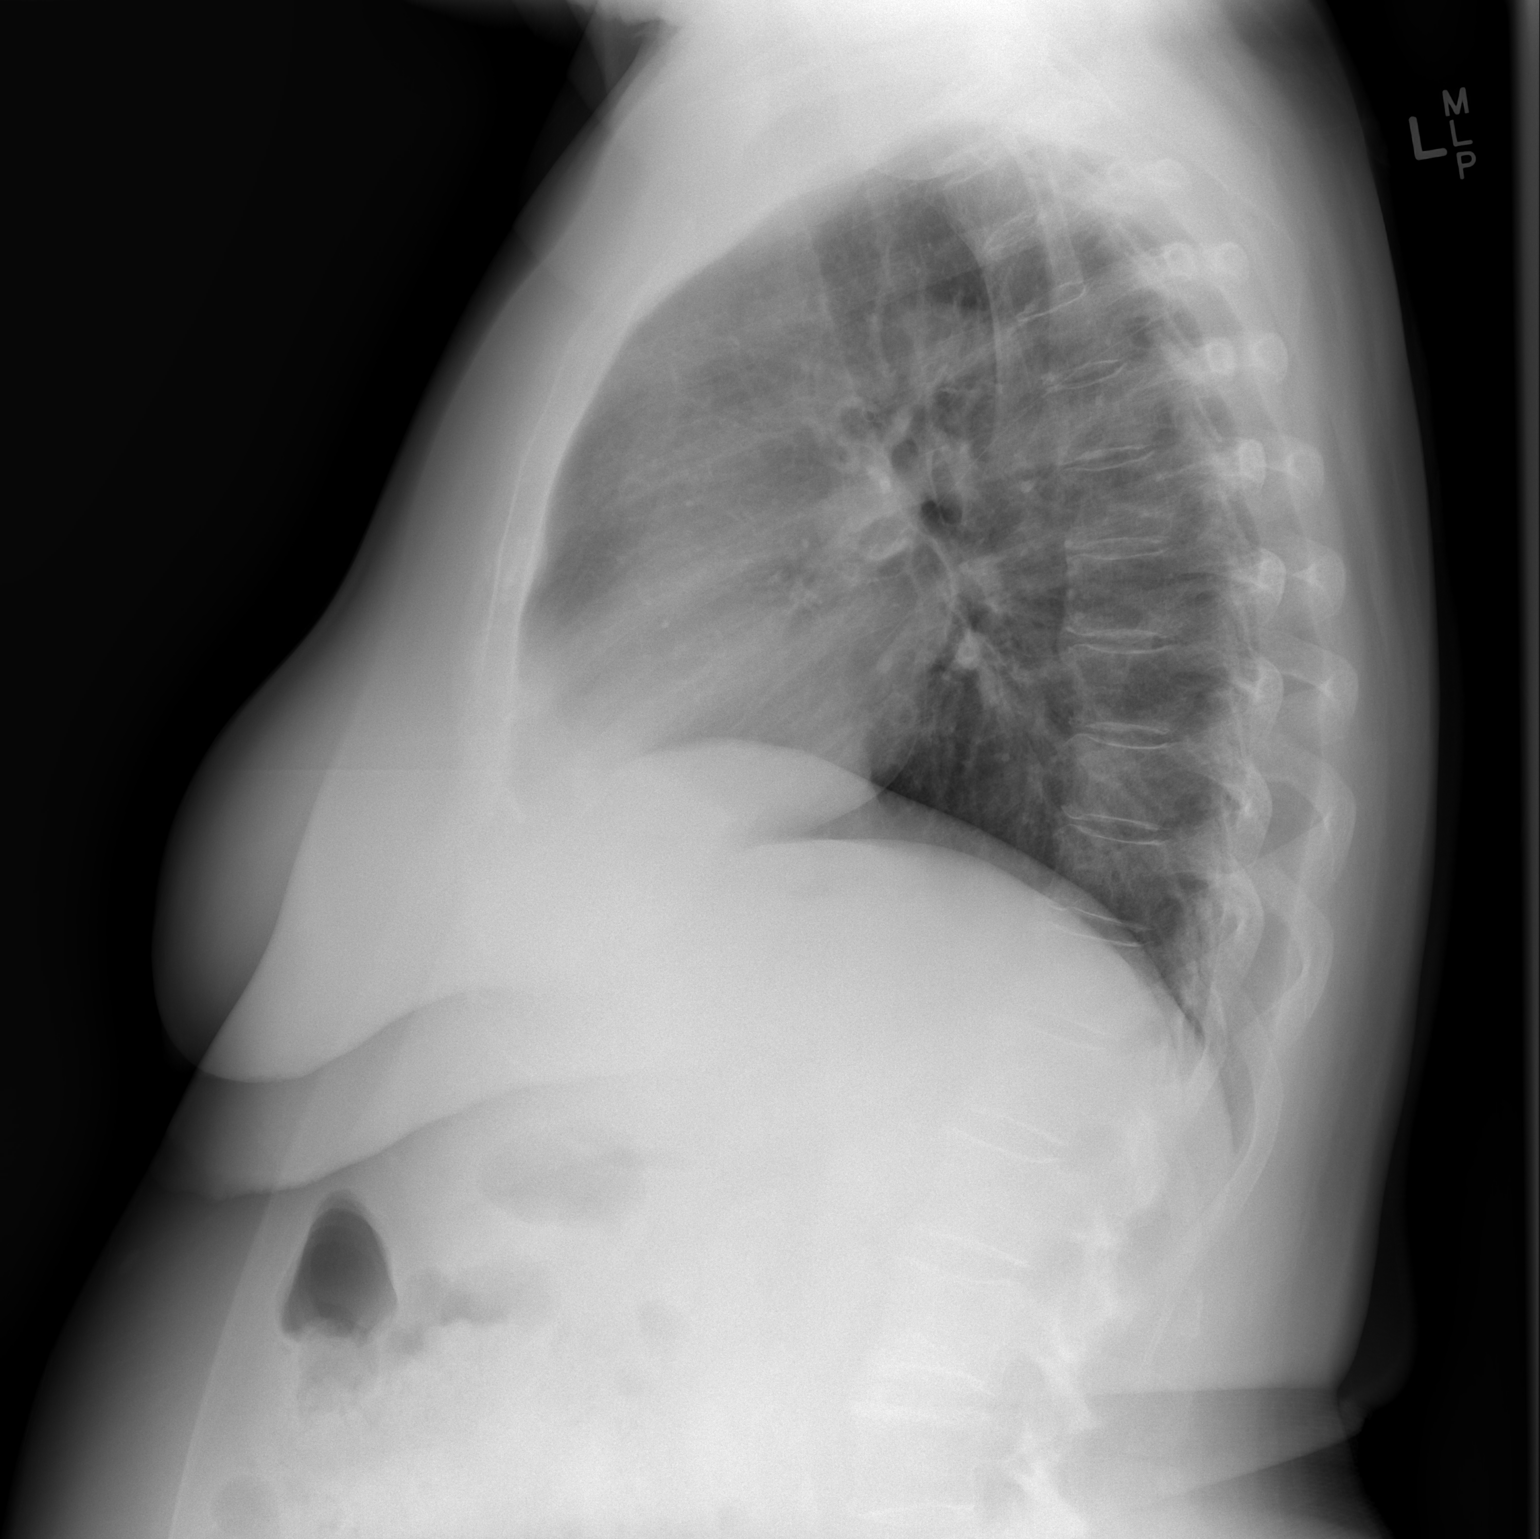

[2 of 2 positions shown; findings below may reference images not displayed]

FINDINGS: Midline trachea.  Normal heart size and mediastinal
contours for age.  No pleural effusion or pneumothorax.  Clear
lungs.
IMPRESSION: No acute cardiopulmonary disease.

## 2012-07-10 ENCOUNTER — Other Ambulatory Visit: Payer: Self-pay

## 2012-08-11 ENCOUNTER — Other Ambulatory Visit (HOSPITAL_BASED_OUTPATIENT_CLINIC_OR_DEPARTMENT_OTHER): Payer: Managed Care, Other (non HMO) | Admitting: Lab

## 2012-08-11 ENCOUNTER — Ambulatory Visit (HOSPITAL_COMMUNITY)
Admission: RE | Admit: 2012-08-11 | Discharge: 2012-08-11 | Disposition: A | Discharge status: Home / Self Care | Payer: Managed Care, Other (non HMO) | Source: Ambulatory Visit | Attending: Oncology | Admitting: Oncology

## 2012-08-11 ENCOUNTER — Ambulatory Visit (HOSPITAL_COMMUNITY): Payer: Managed Care, Other (non HMO)

## 2012-08-11 ENCOUNTER — Other Ambulatory Visit: Payer: Self-pay | Admitting: Oncology

## 2012-08-11 DIAGNOSIS — C8149 Lymphocyte-rich classical Hodgkin lymphoma, extranodal and solid organ sites: Secondary | ICD-10-CM | POA: Insufficient documentation

## 2012-08-11 DIAGNOSIS — I700 Atherosclerosis of aorta: Secondary | ICD-10-CM | POA: Insufficient documentation

## 2012-08-11 DIAGNOSIS — C8100 Nodular lymphocyte predominant Hodgkin lymphoma, unspecified site: Secondary | ICD-10-CM

## 2012-08-11 DIAGNOSIS — K8020 Calculus of gallbladder without cholecystitis without obstruction: Secondary | ICD-10-CM | POA: Insufficient documentation

## 2012-08-11 LAB — CBC WITH DIFFERENTIAL/PLATELET
BASO%: 0.8 % (ref 0.0–2.0)
EOS%: 1.6 % (ref 0.0–7.0)
Eosinophils Absolute: 0.1 10*3/uL (ref 0.0–0.5)
MCHC: 34 g/dL (ref 31.5–36.0)
MCV: 88.7 fL (ref 79.5–101.0)
MONO%: 8.6 % (ref 0.0–14.0)
NEUT#: 5.2 10*3/uL (ref 1.5–6.5)
RBC: 4.41 10*6/uL (ref 3.70–5.45)
RDW: 13.6 % (ref 11.2–14.5)

## 2012-08-11 LAB — BASIC METABOLIC PANEL (CC13)
Calcium: 10 mg/dL (ref 8.4–10.4)
Sodium: 142 mEq/L (ref 136–145)

## 2012-08-11 LAB — SEDIMENTATION RATE: Sed Rate: 11 mm/hr (ref 0–22)

## 2012-08-11 MED ORDER — IOHEXOL 300 MG/ML  SOLN
100.0000 mL | Freq: Once | INTRAMUSCULAR | Status: AC | PRN
  Administered 2012-08-11: 100 mL via INTRAVENOUS

## 2012-08-12 ENCOUNTER — Telehealth: Payer: Self-pay | Admitting: *Deleted

## 2012-08-12 NOTE — Telephone Encounter (Signed)
Message copied by Ignacia Felling on Thu Aug 12, 2012  2:44 PM ------      Message from: Annia Belt      Created: Wed Aug 11, 2012  5:47 PM       Please call patient's CT negative for recurrent lymphoma ------

## 2012-08-12 NOTE — Telephone Encounter (Signed)
Spoke with patient and let her know CT was negative for recurrent lymphoma.  Also reviewed her labs with her and let her know they will be available on My Chart in 4 days.  She requested a copy of the lab be sent to [name] - which I did.

## 2012-08-18 ENCOUNTER — Ambulatory Visit (HOSPITAL_BASED_OUTPATIENT_CLINIC_OR_DEPARTMENT_OTHER): Payer: Managed Care, Other (non HMO) | Admitting: Nurse Practitioner

## 2012-08-18 ENCOUNTER — Telehealth: Payer: Self-pay | Admitting: Oncology

## 2012-08-18 ENCOUNTER — Other Ambulatory Visit: Payer: Self-pay | Admitting: Dermatology

## 2012-08-18 VITALS — BP 184/89 | HR 107 | Temp 97.0°F | Resp 18 | Ht 63.0 in | Wt 198.1 lb

## 2012-08-18 DIAGNOSIS — E119 Type 2 diabetes mellitus without complications: Secondary | ICD-10-CM

## 2012-08-18 DIAGNOSIS — C8190 Hodgkin lymphoma, unspecified, unspecified site: Secondary | ICD-10-CM

## 2012-08-18 DIAGNOSIS — I10 Essential (primary) hypertension: Secondary | ICD-10-CM

## 2012-08-18 DIAGNOSIS — C8144 Lymphocyte-rich classical Hodgkin lymphoma, lymph nodes of axilla and upper limb: Secondary | ICD-10-CM

## 2012-08-18 NOTE — Progress Notes (Signed)
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 Staging evaluation was unremarkable. PET scan showed minimal activity in the left axilla consistent with postsurgical inflammation. She completed radiation between 01/22/2011 and 02/21/2011.  Most recent restaging CT scans of the neck and chest 08/11/2012 showed scattered non-pathologically enlarged lymph nodes in the neck bilaterally which were stable; a stable small triangular subpleural nodule along the right major fissure; no suspicious mediastinal, hilar or axillary lymphadenopathy.  She feels well. She underwent surgery on the left Achilles tendon in February of this year. She is wearing a boot. No fevers or sweats. She has a good appetite. No weight loss. She has not noticed any enlarged lymph nodes. She denies pain. No shortness of breath or cough. No bowel or bladder problems. She recently received the shingles vaccine.   Objective: Blood pressure 184/89, pulse 107, temperature 97 F (36.1 C), temperature source Oral, resp. rate 18, height 5\' 3"  (1.6 m), weight 198 lb 1.6 oz (89.858 kg).  Oropharynx is without thrush or ulceration. No palpable cervical, supraclavicular, axillary or inguinal lymph nodes. Lungs are clear. No wheezes or rales. Regular cardiac rhythm. Abdomen is soft and nontender. No organomegaly. Right leg is without edema. She is wearing a boot on the left leg that extends to just below the knee.  Lab Results: Lab Results  Component Value Date   WBC 7.3 08/11/2012   HGB 13.3 08/11/2012   HCT 39.2 08/11/2012   MCV 88.7 08/11/2012   PLT 276 08/11/2012    Chemistry:    Chemistry      Component Value Date/Time   NA 142 08/11/2012 0838   NA 139 10/01/2011 0934   NA 136 11/22/2010 1515   K 3.9 08/11/2012 0838   K 4.1 10/01/2011 0934   K 4.1 11/22/2010 1515    CL 103 08/11/2012 0838   CL 95* 10/01/2011 0934   CL 99 11/22/2010 1515   CO2 28 08/11/2012 0838   CO2 28 10/01/2011 0934   CO2 27 11/22/2010 1515   BUN 13.5 08/11/2012 0838   BUN 16 10/01/2011 0934   BUN 11 11/22/2010 1515   CREATININE 0.7 08/11/2012 0838   CREATININE 0.6 10/01/2011 0934   CREATININE 0.66 11/22/2010 1515      Component Value Date/Time   CALCIUM 10.0 08/11/2012 0838   CALCIUM 9.2 10/01/2011 0934   CALCIUM 9.9 11/22/2010 1515   ALKPHOS 64 02/11/2012 0827   ALKPHOS 54 04/07/2011 0835   ALKPHOS 78 11/22/2010 1515   AST 17 02/11/2012 0827   AST 19 04/07/2011 0835   AST 20 11/22/2010 1515   ALT 21 02/11/2012 0827   ALT 21 11/22/2010 1515   BILITOT 0.50 02/11/2012 0827   BILITOT 0.50 04/07/2011 0835   BILITOT 0.2* 11/22/2010 1515       Studies/Results: Ct Soft Tissue Neck W Contrast  08/11/2012  *RADIOLOGY REPORT*  Clinical Data: Follow-up Hodgkin's lymphoma.  CT NECK WITH CONTRAST  Technique:  Multidetector CT imaging of the neck was performed with intravenous contrast.  Contrast: 119mL OMNIPAQUE IOHEXOL 300 MG/ML  SOLN  Comparison: CT neck 10/01/2011  Findings: Negative for pathologic adenopathy in the neck. Scattered nonpathologically enlarged lymph nodes in the submandibular and level II stations.  These are unchanged.  Negative for mass lesion.  Visualized intracranial contents are negative.  There is arterial calcification in the right vertebral  artery.  Paranasal sinuses are clear.  Para pharyngeal soft tissues are normal.  There is calcification in the tonsil bilaterally due to chronic infection.  Submandibular and parotid glands are normal bilaterally.  Thyroid is normal.  Atherosclerotic disease with calcific plaque at the right carotid bifurcation.  There is moderate to severe stenosis of the right external carotid artery.  There is atherosclerotic plaque of the left carotid bifurcation.  There is a lenticular low density anterior to the left common carotid artery lumen.  The lumen is  narrowed by 50% diameter stenosis.  This extends over a 3 cm segment and has the appearance of a chronic dissection.  This is slightly larger compared with the study of 10/01/2011.  This has enlarged since the study of 12/27/2010 when it was also present.  IMPRESSION: Negative for cervical adenopathy.  Scattered shotty lymph nodes in the neck bilaterally are stable.  Bilateral carotid atherosclerotic disease.  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   Original Report Authenticated By: Carl Best, M.D.    Ct Chest W Contrast  08/11/2012  *RADIOLOGY REPORT*  Clinical Data: Follow up Hodgkin's lymphoma  CT CHEST WITH CONTRAST  Technique:  Multidetector CT imaging of the chest was performed following the standard protocol during bolus administration of intravenous contrast.  Contrast: 162mL OMNIPAQUE IOHEXOL 300 MG/ML  SOLN  Comparison: 10/01/2011  Findings: Stable small triangular subpleural node along the right major fissure (series 5/image 23).  No suspicious pulmonary nodules.  No pleural effusion or pneumothorax.  Visualized thyroid is unremarkable.  The heart is normal in size.  No pericardial effusion.  Coronary atherosclerosis.  Atherosclerotic calcifications of the aortic arch.  No suspicious mediastinal, hilar, or axillary lymphadenopathy.  Visualized upper abdomen is notable for cholelithiasis.  Degenerative changes of the visualized thoracolumbar spine.  IMPRESSION: No evidence of lymphomatous involvement in the chest.  Cholelithiasis.   Original Report Authenticated By: Julian Hy, M.D.     Medications: I have reviewed the patient's current medications.  Assessment/Plan:  1. 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 2. Diabetes mellitus. 3. Hypertension. 4. Status post surgery left Achilles tendon February  2014.  Disposition-Ms. Katie Hawkins remains in remission from Hodgkin's lymphoma. She will return for a followup visit in 6 months. She will contact the office in the interim with any problems.  Ned Card ANP/GNP-BC    CC [name], Dr. Armandina Gemma, Dr. Thea Silversmith.

## 2012-08-18 NOTE — Telephone Encounter (Signed)
Gave pt appt for lab before MD on September 2014

## 2013-02-18 ENCOUNTER — Other Ambulatory Visit (HOSPITAL_BASED_OUTPATIENT_CLINIC_OR_DEPARTMENT_OTHER): Payer: Managed Care, Other (non HMO) | Admitting: Lab

## 2013-02-18 DIAGNOSIS — C8190 Hodgkin lymphoma, unspecified, unspecified site: Secondary | ICD-10-CM

## 2013-02-18 DIAGNOSIS — C8144 Lymphocyte-rich classical Hodgkin lymphoma, lymph nodes of axilla and upper limb: Secondary | ICD-10-CM

## 2013-02-18 LAB — BASIC METABOLIC PANEL (CC13)
BUN: 14.1 mg/dL (ref 7.0–26.0)
Calcium: 9.9 mg/dL (ref 8.4–10.4)
Potassium: 4.2 mEq/L (ref 3.5–5.1)

## 2013-02-18 LAB — CBC WITH DIFFERENTIAL/PLATELET
BASO%: 0.5 % (ref 0.0–2.0)
EOS%: 1.6 % (ref 0.0–7.0)
MCH: 29.5 pg (ref 25.1–34.0)
MCHC: 32.8 g/dL (ref 31.5–36.0)
NEUT%: 64.8 % (ref 38.4–76.8)
RDW: 13 % (ref 11.2–14.5)
lymph#: 1.7 10*3/uL (ref 0.9–3.3)

## 2013-02-21 ENCOUNTER — Telehealth: Payer: Self-pay | Admitting: Oncology

## 2013-02-21 ENCOUNTER — Ambulatory Visit (HOSPITAL_BASED_OUTPATIENT_CLINIC_OR_DEPARTMENT_OTHER): Payer: Managed Care, Other (non HMO) | Admitting: Oncology

## 2013-02-21 VITALS — BP 146/92 | HR 103 | Temp 98.2°F | Resp 20 | Ht 63.0 in | Wt 196.5 lb

## 2013-02-21 DIAGNOSIS — R599 Enlarged lymph nodes, unspecified: Secondary | ICD-10-CM

## 2013-02-21 DIAGNOSIS — C8190 Hodgkin lymphoma, unspecified, unspecified site: Secondary | ICD-10-CM

## 2013-02-21 DIAGNOSIS — C8144 Lymphocyte-rich classical Hodgkin lymphoma, lymph nodes of axilla and upper limb: Secondary | ICD-10-CM

## 2013-02-21 DIAGNOSIS — Z23 Encounter for immunization: Secondary | ICD-10-CM

## 2013-02-21 MED ORDER — INFLUENZA VAC SPLIT QUAD 0.5 ML IM SUSP
0.5000 mL | Freq: Once | INTRAMUSCULAR | Status: AC
  Administered 2013-02-21: 0.5 mL via INTRAMUSCULAR
  Filled 2013-02-21: qty 0.5

## 2013-02-21 NOTE — Telephone Encounter (Signed)
gv adn printed appt sched and avs for pt for Sept 2015

## 2013-02-21 NOTE — Progress Notes (Signed)
Hematology and Oncology Follow Up Visit  Katie Hawkins [id number] 10/28/1950 62 y.o. 02/21/2013 1:29 PM   Principle Diagnosis: Encounter Diagnoses  Name Primary?  . Hodgkin's disease, stage IA Yes  . left axillary ymph node enlargement      Interim History:   Followup visit for this 62 year old woman referred here in August, 2012. She was incidentally found to have left axillary adenopathy on a routine mammogram. This led to a number of biopsies and ultimately a surgical excisional biopsy done by Dr. Armandina Gemma on 11/26/2010. Pathology did not appear straightforward. Second opinion was obtained at the [facility], and this was felt to be an area of nodular lymphocyte-predominant Hodgkin disease. She underwent a staging evaluation including a PET scan on 12/27/2010 which was unremarkable except for some residual inflammatory change in the left axilla. She was asymptomatic and therefore stage IA disease. Bone marrow biopsy not indicated.  I reviewed the literature and found that nodular lymphocyte-predominant Hodgkin's only comprises 5% of all Hodgkin's. It expresses CD20. It behaves more like a low-grade B-cell non-Hodgkin lymphoma in that it is very indolent. It can be treated with a local therapy with good outcome. I referred her to Dr. Thea Silversmith, Radiation Oncology. She received involved field radiation with 36 gray in 1.8 grays fractions for 20 fractions to the left axilla and supraclavicular area between August 29 and 02/21/2011. She had a complete radiographic response. She has remained in remission clinically and on followup CT scans. She had a PET scan at the conclusion of her radiation and also had a complete response.  She's had no interim problems.   Medications: reviewed  Allergies: No Known Allergies  Review of Systems: Constitutional:   No constitutional symptoms HEENT no sore throat Respiratory: No cough or dyspnea Cardiovascular:  No chest  pain or palpitations Gastrointestinal: No change in bowel habit Genito-Urinary: Not questioned Musculoskeletal: Still recovering from Achilles tendon surgery Neurologic: No headache or change in vision Skin: No rash or ecchymosis  Remaining ROS negative.     Physical Exam: Blood pressure 146/92, pulse 103, temperature 98.2 F (36.8 C), temperature source Oral, resp. rate 20, height 5\' 3"  (1.6 m), weight 196 lb 8 oz (89.132 kg). Wt Readings from Last 3 Encounters:  02/21/13 196 lb 8 oz (89.132 kg)  08/18/12 198 lb 1.6 oz (89.858 kg)  02/13/12 196 lb 8 oz (89.132 kg)     General appearance: Well-nourished Caucasian woman HENNT: Pharynx no erythema exudate or ulcer Lymph nodes: No cervical, supraclavicular, or axillary adenopathy Breasts: Lungs: Clear to auscultation resonant to percussion Heart: Regular rhythm no murmur Abdomen: Soft, nontender, no mass, no organomegaly Extremities: No edema, no calf tenderness Musculoskeletal: No joint deformities GU: Vascular: No carotid bruits. No cyanosis Neurologic: Grossly normal Skin: No rash or ecchymosis  Lab Results: Lab Results  Component Value Date   WBC 7.5 02/18/2013   HGB 13.0 02/18/2013   HCT 39.6 02/18/2013   MCV 90.0 02/18/2013   PLT 282 02/18/2013     Chemistry      Component Value Date/Time   NA 142 02/18/2013 0807   NA 139 10/01/2011 0934   NA 136 11/22/2010 1515   K 4.2 02/18/2013 0807   K 4.1 10/01/2011 0934   K 4.1 11/22/2010 1515   CL 103 08/11/2012 0838   CL 95* 10/01/2011 0934   CL 99 11/22/2010 1515   CO2 27 02/18/2013 0807   CO2 28 10/01/2011 0934   CO2 27 11/22/2010 1515  BUN 14.1 02/18/2013 0807   BUN 16 10/01/2011 0934   BUN 11 11/22/2010 1515   CREATININE 0.7 02/18/2013 0807   CREATININE 0.6 10/01/2011 0934   CREATININE 0.66 11/22/2010 1515      Component Value Date/Time   CALCIUM 9.9 02/18/2013 0807   CALCIUM 9.2 10/01/2011 0934   CALCIUM 9.9 11/22/2010 1515   ALKPHOS 64 02/11/2012 0827   ALKPHOS 54 04/07/2011  0835   ALKPHOS 78 11/22/2010 1515   AST 17 02/11/2012 0827   AST 19 04/07/2011 0835   AST 20 11/22/2010 1515   ALT 21 02/11/2012 0827   ALT 26 04/07/2011 0835   ALT 21 11/22/2010 1515   BILITOT 0.50 02/11/2012 0827   BILITOT 0.50 04/07/2011 0835   BILITOT 0.2* 11/22/2010 1515        Impression: 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 No evidence for recurrence now 2 years from diagnosis. Plan: He'll repeat CT scans at this time. They're normal and we do not need to get any subsequent CT scans unless otherwise indicated by change in physical findings or symptoms.  We will go ahead and give her a flu shot today.   Annia Belt, MD 9/29/20141:29 PM

## 2013-02-23 ENCOUNTER — Ambulatory Visit (HOSPITAL_COMMUNITY)
Admission: RE | Admit: 2013-02-23 | Discharge: 2013-02-23 | Disposition: A | Discharge status: Home / Self Care | Payer: Managed Care, Other (non HMO) | Source: Ambulatory Visit | Attending: Oncology | Admitting: Oncology

## 2013-02-23 ENCOUNTER — Encounter (HOSPITAL_COMMUNITY): Payer: Self-pay

## 2013-02-23 DIAGNOSIS — Z87898 Personal history of other specified conditions: Secondary | ICD-10-CM | POA: Insufficient documentation

## 2013-02-23 DIAGNOSIS — I2510 Atherosclerotic heart disease of native coronary artery without angina pectoris: Secondary | ICD-10-CM | POA: Insufficient documentation

## 2013-02-23 DIAGNOSIS — K8020 Calculus of gallbladder without cholecystitis without obstruction: Secondary | ICD-10-CM | POA: Insufficient documentation

## 2013-02-23 DIAGNOSIS — E119 Type 2 diabetes mellitus without complications: Secondary | ICD-10-CM | POA: Insufficient documentation

## 2013-02-23 DIAGNOSIS — I10 Essential (primary) hypertension: Secondary | ICD-10-CM | POA: Insufficient documentation

## 2013-02-23 DIAGNOSIS — Z09 Encounter for follow-up examination after completed treatment for conditions other than malignant neoplasm: Secondary | ICD-10-CM | POA: Insufficient documentation

## 2013-02-23 DIAGNOSIS — Z8571 Personal history of Hodgkin lymphoma: Secondary | ICD-10-CM | POA: Insufficient documentation

## 2013-02-23 DIAGNOSIS — C8190 Hodgkin lymphoma, unspecified, unspecified site: Secondary | ICD-10-CM

## 2013-02-23 DIAGNOSIS — E785 Hyperlipidemia, unspecified: Secondary | ICD-10-CM | POA: Insufficient documentation

## 2013-02-23 MED ORDER — IOHEXOL 300 MG/ML  SOLN
100.0000 mL | Freq: Once | INTRAMUSCULAR | Status: AC | PRN
  Administered 2013-02-23: 100 mL via INTRAVENOUS

## 2013-02-24 ENCOUNTER — Telehealth: Payer: Self-pay | Admitting: *Deleted

## 2013-02-24 NOTE — Telephone Encounter (Signed)
Message copied by Jesse Fall on Thu Feb 24, 2013  2:52 PM ------      Message from: Annia Belt      Created: Wed Feb 23, 2013  7:52 PM       call pt CT negative for recurrent Hodgkin's ------

## 2013-02-24 NOTE — Telephone Encounter (Signed)
Pt called for results of CT.  Informed per Dr Beryle Beams that CT negative for recurrent hodgkin's.  She expressed appreciation.

## 2013-07-25 ENCOUNTER — Other Ambulatory Visit (HOSPITAL_COMMUNITY)
Admission: RE | Admit: 2013-07-25 | Discharge: 2013-07-25 | Disposition: A | Discharge status: Home / Self Care | Payer: Managed Care, Other (non HMO) | Source: Ambulatory Visit | Attending: Family Medicine | Admitting: Family Medicine

## 2013-07-25 ENCOUNTER — Other Ambulatory Visit: Payer: Self-pay | Admitting: Family Medicine

## 2013-07-25 DIAGNOSIS — Z124 Encounter for screening for malignant neoplasm of cervix: Secondary | ICD-10-CM | POA: Insufficient documentation

## 2013-11-22 ENCOUNTER — Other Ambulatory Visit: Payer: Self-pay | Admitting: Radiology

## 2014-02-20 ENCOUNTER — Other Ambulatory Visit (HOSPITAL_BASED_OUTPATIENT_CLINIC_OR_DEPARTMENT_OTHER): Payer: Managed Care, Other (non HMO)

## 2014-02-20 ENCOUNTER — Ambulatory Visit (HOSPITAL_BASED_OUTPATIENT_CLINIC_OR_DEPARTMENT_OTHER): Payer: Managed Care, Other (non HMO) | Admitting: Nurse Practitioner

## 2014-02-20 VITALS — BP 137/88 | HR 101 | Temp 99.3°F | Resp 18 | Ht 63.0 in | Wt 194.5 lb

## 2014-02-20 DIAGNOSIS — C8190 Hodgkin lymphoma, unspecified, unspecified site: Secondary | ICD-10-CM

## 2014-02-20 DIAGNOSIS — C8144 Lymphocyte-rich classical Hodgkin lymphoma, lymph nodes of axilla and upper limb: Secondary | ICD-10-CM

## 2014-02-20 DIAGNOSIS — Z23 Encounter for immunization: Secondary | ICD-10-CM

## 2014-02-20 LAB — COMPREHENSIVE METABOLIC PANEL (CC13)
ALK PHOS: 94 U/L (ref 40–150)
ALT: 14 U/L (ref 0–55)
AST: 17 U/L (ref 5–34)
Albumin: 4.1 g/dL (ref 3.5–5.0)
Anion Gap: 10 mEq/L (ref 3–11)
BILIRUBIN TOTAL: 0.4 mg/dL (ref 0.20–1.20)
BUN: 16.6 mg/dL (ref 7.0–26.0)
CO2: 27 mEq/L (ref 22–29)
CREATININE: 0.7 mg/dL (ref 0.6–1.1)
Calcium: 10 mg/dL (ref 8.4–10.4)
Chloride: 103 mEq/L (ref 98–109)
Glucose: 176 mg/dl — ABNORMAL HIGH (ref 70–140)
Potassium: 3.7 mEq/L (ref 3.5–5.1)
Sodium: 140 mEq/L (ref 136–145)
Total Protein: 7.6 g/dL (ref 6.4–8.3)

## 2014-02-20 LAB — CBC WITH DIFFERENTIAL/PLATELET
BASO%: 0.8 % (ref 0.0–2.0)
BASOS ABS: 0.1 10*3/uL (ref 0.0–0.1)
EOS%: 1.5 % (ref 0.0–7.0)
Eosinophils Absolute: 0.1 10*3/uL (ref 0.0–0.5)
HEMATOCRIT: 41 % (ref 34.8–46.6)
HEMOGLOBIN: 13.4 g/dL (ref 11.6–15.9)
LYMPH%: 21.2 % (ref 14.0–49.7)
MCH: 29.2 pg (ref 25.1–34.0)
MCHC: 32.6 g/dL (ref 31.5–36.0)
MCV: 89.8 fL (ref 79.5–101.0)
MONO#: 0.8 10*3/uL (ref 0.1–0.9)
MONO%: 10.1 % (ref 0.0–14.0)
NEUT#: 5.5 10*3/uL (ref 1.5–6.5)
NEUT%: 66.4 % (ref 38.4–76.8)
Platelets: 318 10*3/uL (ref 145–400)
RBC: 4.57 10*6/uL (ref 3.70–5.45)
RDW: 13.6 % (ref 11.2–14.5)
WBC: 8.2 10*3/uL (ref 3.9–10.3)
lymph#: 1.7 10*3/uL (ref 0.9–3.3)

## 2014-02-20 LAB — LACTATE DEHYDROGENASE (CC13): LDH: 168 U/L (ref 125–245)

## 2014-02-20 MED ORDER — INFLUENZA VAC SPLIT QUAD 0.5 ML IM SUSY
0.5000 mL | PREFILLED_SYRINGE | Freq: Once | INTRAMUSCULAR | Status: AC
  Administered 2014-02-20: 0.5 mL via INTRAMUSCULAR
  Filled 2014-02-20: qty 0.5

## 2014-02-20 NOTE — Progress Notes (Signed)
  [place] OFFICE PROGRESS NOTE   Diagnosis:  Hodgkin's lymphoma  INTERVAL HISTORY:   Ms. Katie Hawkins returns as scheduled. To review, she is a 63 year old woman diagnosed stage IA nodular lymphocyte predominant Hodgkin's lymphoma involving left axillary adenopathy August 2012, initially identified on a routine mammogram. She completed involved field radiation.  She feels well. No fevers or sweats. She has a good appetite. Her weight is stable. She denies shortness of breath. No cough. She denies chest pain. No bowel or bladder problems.   Objective:  Vital signs in last 24 hours:  Blood pressure 137/88, pulse 101, temperature 99.3 F (37.4 C), temperature source Oral, resp. rate 18, height 5\' 3"  (1.6 m), weight 194 lb 8 oz (88.225 kg), SpO2 100.00%.    HEENT: No thrush or ulcers. Lymphatics: No palpable cervical, supraclavicular, axillary or inguinal lymph nodes. Resp: Lungs clear bilaterally. Cardio: Regular rate and rhythm. GI: Abdomen soft and nontender. No organomegaly. Vascular: No leg edema.   Breasts: No mass palpated in either breast.  Lab Results:  Lab Results  Component Value Date   WBC 8.2 02/20/2014   HGB 13.4 02/20/2014   HCT 41.0 02/20/2014   MCV 89.8 02/20/2014   PLT 318 02/20/2014   NEUTROABS 5.5 02/20/2014    Imaging:  No results found.  Medications: I have reviewed the patient's current medications.  Assessment/Plan: 1. 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 2. Abnormal mammogram 11/17/2013 with a 3 mm indeterminate density in the left breast. Followup ultrasound showed a 4 mm irregular nodule in the left breast. Needle biopsy on 11/22/2013 showed benign breast tissue. Negative for atypia or malignancy.   Disposition: She remains in clinical remission from Hodgkin's lymphoma. We scheduled a return visit in one year. She will contact the office in the interim with any  problems.  Plan reviewed with Dr. Benay Spice.    Ned Card ANP/GNP-BC   02/20/2014  12:35 PM

## 2014-02-21 ENCOUNTER — Telehealth: Payer: Self-pay | Admitting: *Deleted

## 2014-02-21 LAB — SEDIMENTATION RATE: Sed Rate: 8 mm/hr (ref 0–22)

## 2014-02-21 NOTE — Telephone Encounter (Signed)
Call from pt requesting lab results. Labs are normal, will be available on MyChart in 4 days. She voiced understanding.

## 2014-02-25 ENCOUNTER — Telehealth: Payer: Self-pay | Admitting: Oncology

## 2014-02-25 NOTE — Telephone Encounter (Signed)
returned pt call and confrimed that she did not have a MRI and she recieved the letter by mistake.Marland KitchenMarland Kitchen

## 2014-05-11 ENCOUNTER — Other Ambulatory Visit: Payer: Self-pay | Admitting: Gastroenterology

## 2014-06-15 ENCOUNTER — Other Ambulatory Visit: Payer: Self-pay | Admitting: Dermatology

## 2014-07-19 DIAGNOSIS — L905 Scar conditions and fibrosis of skin: Secondary | ICD-10-CM | POA: Diagnosis not present

## 2014-07-19 DIAGNOSIS — D485 Neoplasm of uncertain behavior of skin: Secondary | ICD-10-CM | POA: Diagnosis not present

## 2014-08-31 DIAGNOSIS — Z23 Encounter for immunization: Secondary | ICD-10-CM | POA: Diagnosis not present

## 2014-08-31 DIAGNOSIS — L089 Local infection of the skin and subcutaneous tissue, unspecified: Secondary | ICD-10-CM | POA: Diagnosis not present

## 2014-08-31 DIAGNOSIS — E1165 Type 2 diabetes mellitus with hyperglycemia: Secondary | ICD-10-CM | POA: Diagnosis not present

## 2014-12-14 DIAGNOSIS — Z1231 Encounter for screening mammogram for malignant neoplasm of breast: Secondary | ICD-10-CM | POA: Diagnosis not present

## 2015-01-10 DIAGNOSIS — E780 Pure hypercholesterolemia: Secondary | ICD-10-CM | POA: Diagnosis not present

## 2015-01-10 DIAGNOSIS — Z23 Encounter for immunization: Secondary | ICD-10-CM | POA: Diagnosis not present

## 2015-01-10 DIAGNOSIS — E1165 Type 2 diabetes mellitus with hyperglycemia: Secondary | ICD-10-CM | POA: Diagnosis not present

## 2015-01-10 DIAGNOSIS — R9431 Abnormal electrocardiogram [ECG] [EKG]: Secondary | ICD-10-CM | POA: Diagnosis not present

## 2015-01-10 DIAGNOSIS — Z1389 Encounter for screening for other disorder: Secondary | ICD-10-CM | POA: Diagnosis not present

## 2015-01-10 DIAGNOSIS — I10 Essential (primary) hypertension: Secondary | ICD-10-CM | POA: Diagnosis not present

## 2015-01-10 DIAGNOSIS — Z0001 Encounter for general adult medical examination with abnormal findings: Secondary | ICD-10-CM | POA: Diagnosis not present

## 2015-02-22 ENCOUNTER — Ambulatory Visit (HOSPITAL_BASED_OUTPATIENT_CLINIC_OR_DEPARTMENT_OTHER): Payer: Commercial Managed Care - HMO | Admitting: Oncology

## 2015-02-22 VITALS — BP 141/91 | HR 108 | Temp 98.6°F | Resp 18 | Ht 63.0 in | Wt 179.1 lb

## 2015-02-22 DIAGNOSIS — C8190 Hodgkin lymphoma, unspecified, unspecified site: Secondary | ICD-10-CM

## 2015-02-22 NOTE — Progress Notes (Signed)
  [place] OFFICE PROGRESS NOTE   Diagnosis: Hodgkin's lymphoma  INTERVAL HISTORY:   Katie Hawkins returns as scheduled. She feels well. No fever, night sweats, anorexia, or palpable lymph nodes. She reports intentional weight loss with dieting.  Objective:  Vital signs in last 24 hours:  Blood pressure 141/91, pulse 108, temperature 98.6 F (37 C), temperature source Oral, resp. rate 18, height 5\' 3"  (1.6 m), weight 179 lb 1.6 oz (81.239 kg), SpO2 99 %.    HEENT: Neck without mass Lymphatics: No cervical, supraclavicular, axillary, or inguinal nodes. Left axillary scar without evidence of recurrent tumor. Resp: Lungs clear bilaterally Cardio: Regular rate and rhythm GI: No hepatosplenomegaly, no mass Vascular: No leg edema a  Lab Results:  Lab Results  Component Value Date   WBC 8.2 02/20/2014   HGB 13.4 02/20/2014   HCT 41.0 02/20/2014   MCV 89.8 02/20/2014   PLT 318 02/20/2014   NEUTROABS 5.5 02/20/2014     Medications: I have reviewed the patient's current medications.  Assessment/Plan: 1. 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 Abnormal mammogram 11/17/2013 with a 3 mm indeterminate density in the left breast. Followup ultrasound showed a 4 mm irregular nodule in the left breast. Needle biopsy on 11/22/2013 showed benign breast tissue. Negative for atypia or malignancy.     Disposition:  Katie Hawkins remains in clinical remission from Hodgkin's lymphoma. She has a good prognosis for a long-term disease-free survival. She plans to continue clinical follow-up with Dr. Tamala Julian. She is not scheduled for a follow-up appointment at the [facility] We are available see her in the future as needed. She will continue yearly mammography.  Betsy Coder, MD  02/22/2015  11:31 AM

## 2015-03-08 ENCOUNTER — Ambulatory Visit (INDEPENDENT_AMBULATORY_CARE_PROVIDER_SITE_OTHER): Payer: Commercial Managed Care - HMO | Admitting: Interventional Cardiology

## 2015-03-08 ENCOUNTER — Encounter: Payer: Self-pay | Admitting: Interventional Cardiology

## 2015-03-08 VITALS — BP 176/90 | HR 86 | Ht 63.0 in | Wt 183.8 lb

## 2015-03-08 DIAGNOSIS — I10 Essential (primary) hypertension: Secondary | ICD-10-CM | POA: Diagnosis not present

## 2015-03-08 DIAGNOSIS — L539 Erythematous condition, unspecified: Secondary | ICD-10-CM

## 2015-03-08 DIAGNOSIS — R9431 Abnormal electrocardiogram [ECG] [EKG]: Secondary | ICD-10-CM

## 2015-03-08 DIAGNOSIS — E119 Type 2 diabetes mellitus without complications: Secondary | ICD-10-CM

## 2015-03-08 NOTE — Progress Notes (Signed)
Patient ID: Katie Hawkins, female   DOB: March 13, 1951, 64 y.o.   MRN: [id number]     Cardiology Office Note   Date:  03/08/2015   ID:  Katie Hawkins, DOB [birth year], MRN [id number]  PCP:  Katie Naas, MD    Chief Complaint  Patient presents with  . NEW PATIENT    ABNORMAL EKG AT PRIMARY  abnormal ECG   Wt Readings from Last 3 Encounters:  03/08/15 183 lb 12.8 oz (83.371 kg)  02/22/15 179 lb 1.6 oz (81.239 kg)  02/20/14 194 lb 8 oz (88.225 kg)       History of Present Illness: Katie Hawkins is a 64 y.o. female  Who was at her primary care MD.  A routine ECG was done for a medicare visit.  It was abnormal and she is sent here for further evaluation.  SHe had some redness of the toes of her left foot. She denied any cramping in her calves. No ulcers on the tips of her toes or on her feet anywhere. After applying lotion to left foot, the redness has improved. She does note that it is worse if she has her leg hanging down. It resolves quickly when she lifts her leg up.  Of note, she had an ECG in 2012 and 2014. She denies ever having any chest discomfort or dyspnea on exertion. She walks 20-30 minutes 3-4 times a week. She watches her 68-month-old grandson 2 days a week and does a lot of walking with him. She denies any cardiac symptoms with this activity. She has never had any prolonged chest discomfort for which she did not seek medical attention. She has never had any cardiac testing that she recalls other than an ECG.  She was treated for Hodgkin's disease in 2012. Over the past 10 years, she has lost about 70 pounds intentionally due to her diagnosis of diabetes.   BP at home is better controlled.  Yesterday was 108/70.      Past Medical History  Diagnosis Date  . Diabetes mellitus   . Hypertension   . Hyperlipidemia   . Hypercholesterolemia   . nodular lymphocyte predominate hodgkins lymphoma dx'd 10/2010    xrt comp 02/20/11  . HTN (hypertension), benign 02/13/2012      Past Surgical History  Procedure Laterality Date  . Cesarean section      2  . Dilation and curettage of uterus       Current Outpatient Prescriptions  Medication Sig Dispense Refill  . glucose blood test strip 1 each by Other route as needed. Use as instructed. One Touch Delica Lancets used also.     . lisinopril (PRINIVIL,ZESTRIL) 10 MG tablet Take 10 mg by mouth daily.      . metFORMIN (GLUMETZA) 500 MG (MOD) 24 hr tablet Take 500 mg by mouth daily with breakfast.      . metoprolol succinate (TOPROL-XL) 25 MG 24 hr tablet Take 25 mg by mouth daily.  0  . Multiple Vitamins-Minerals (MULTIVITAMIN GUMMIES ADULT PO) Take 2 tablets by mouth daily.    . pravastatin (PRAVACHOL) 40 MG tablet Take 40 mg by mouth Daily.      No current facility-administered medications for this visit.    Allergies:   Review of patient's allergies indicates no known allergies.    Social History:  The patient  reports that she has never smoked. She has never used smokeless tobacco. She reports that she drinks alcohol. She reports that she does not use illicit drugs.  Family History:  The patient's family history includes Cancer in her maternal grandfather and paternal grandfather; Diabetes in her father.    ROS:  Please see the history of present illness.   Otherwise, review of systems are positive for foot redness as noted above.   Blood pressure typically elevated in the doctor's office but controlled at home. All other systems are reviewed and negative.    PHYSICAL EXAM: VS:  BP 176/90 mmHg  Pulse 86  Ht 5\' 3"  (1.6 m)  Wt 183 lb 12.8 oz (83.371 kg)  BMI 32.57 kg/m2 , BMI Body mass index is 32.57 kg/(m^2). GEN: Well nourished, well developed, in no acute distress HEENT: normal Neck: no JVD, carotid bruits, or masses Cardiac: RRR; no murmurs, rubs, or gallops,no edema  Respiratory:  clear to auscultation bilaterally, normal work of breathing GI: soft, nontender, nondistended, + BS MS: no  deformity or atrophy Skin: warm and dry, no rash, palpable PT pulses bilaterally, minimal toe redness on the left toes Neuro:  Strength and sensation are intact Psych: euthymic mood, full affect   EKG:   The ekg ordered today demonstrates NSR, septal Q waves   Recent Labs: No results found for requested labs within last 365 days.   Lipid Panel No results found for: CHOL, TRIG, HDL, CHOLHDL, VLDL, LDLCALC, LDLDIRECT   Other studies Reviewed: Additional studies/ records that were reviewed today with results demonstrating: 2012 ECG personally reviewed. Normal sinus rhythm with septal Q waves. There is also a Q wave in lead 3 at that time.   ASSESSMENT AND PLAN:  1. Abnormal ECG: Septal Q waves. No significant change compared to 2012 ECG. We discussed echocardiogram to evaluate the structure of her heart. At this point, since she is asymptomatic, she would prefer to hold off on this test. I think this is reasonable. She will let us know if she has any cardiac symptoms. Given her diabetes, if she did have symptoms, would plan stress test with imaging.  2. Diabetes: This is managed by her primary care doctor. Continue risk factor modification including weight loss. 3. Foot redness: Has resolved. She may have some degree of mild venous insufficiency. Elevate legs when possible. 4. HTN:  Controlled at home.  Continue current meds.    Current medicines are reviewed at length with the patient today.  The patient concerns regarding her medicines were addressed.  The following changes have been made:  No change  Labs/ tests ordered today include:   Orders Placed This Encounter  Procedures  . EKG 12-Lead    Recommend 150 minutes/week of aerobic exercise Low fat, low carb, high fiber diet recommended  Disposition:   FU in prn   Teresita Madura., MD  03/08/2015 9:01 AM    [facility] [address], [place], [place]  [address] Phone: [phone];  Fax: [phone]

## 2015-03-08 NOTE — Patient Instructions (Signed)
Medication Instructions:  Same-no changes  Labwork: None  Testing/Procedures: None  Follow-Up: Your physician recommends that you schedule a follow-up appointment in: as needed

## 2015-07-12 DIAGNOSIS — E7800 Pure hypercholesterolemia, unspecified: Secondary | ICD-10-CM | POA: Diagnosis not present

## 2015-07-12 DIAGNOSIS — Z7984 Long term (current) use of oral hypoglycemic drugs: Secondary | ICD-10-CM | POA: Diagnosis not present

## 2015-07-12 DIAGNOSIS — I10 Essential (primary) hypertension: Secondary | ICD-10-CM | POA: Diagnosis not present

## 2015-07-12 DIAGNOSIS — E119 Type 2 diabetes mellitus without complications: Secondary | ICD-10-CM | POA: Diagnosis not present

## 2015-08-01 DIAGNOSIS — H5203 Hypermetropia, bilateral: Secondary | ICD-10-CM | POA: Diagnosis not present

## 2015-08-01 DIAGNOSIS — E119 Type 2 diabetes mellitus without complications: Secondary | ICD-10-CM | POA: Diagnosis not present

## 2015-08-01 DIAGNOSIS — H5210 Myopia, unspecified eye: Secondary | ICD-10-CM | POA: Diagnosis not present

## 2015-09-06 DIAGNOSIS — B351 Tinea unguium: Secondary | ICD-10-CM | POA: Diagnosis not present

## 2015-09-06 DIAGNOSIS — L600 Ingrowing nail: Secondary | ICD-10-CM | POA: Diagnosis not present

## 2015-09-26 DIAGNOSIS — L600 Ingrowing nail: Secondary | ICD-10-CM | POA: Diagnosis not present

## 2015-09-26 DIAGNOSIS — B351 Tinea unguium: Secondary | ICD-10-CM | POA: Diagnosis not present

## 2015-09-26 DIAGNOSIS — E119 Type 2 diabetes mellitus without complications: Secondary | ICD-10-CM | POA: Diagnosis not present

## 2015-10-05 DIAGNOSIS — L03032 Cellulitis of left toe: Secondary | ICD-10-CM | POA: Diagnosis not present

## 2015-10-11 DIAGNOSIS — E1165 Type 2 diabetes mellitus with hyperglycemia: Secondary | ICD-10-CM | POA: Diagnosis not present

## 2015-10-24 DIAGNOSIS — L03032 Cellulitis of left toe: Secondary | ICD-10-CM | POA: Diagnosis not present

## 2015-12-19 DIAGNOSIS — Z1231 Encounter for screening mammogram for malignant neoplasm of breast: Secondary | ICD-10-CM | POA: Diagnosis not present

## 2015-12-19 DIAGNOSIS — Z803 Family history of malignant neoplasm of breast: Secondary | ICD-10-CM | POA: Diagnosis not present

## 2016-04-16 DIAGNOSIS — Z23 Encounter for immunization: Secondary | ICD-10-CM | POA: Diagnosis not present

## 2016-04-16 DIAGNOSIS — I10 Essential (primary) hypertension: Secondary | ICD-10-CM | POA: Diagnosis not present

## 2016-04-16 DIAGNOSIS — E7800 Pure hypercholesterolemia, unspecified: Secondary | ICD-10-CM | POA: Diagnosis not present

## 2016-04-16 DIAGNOSIS — E1165 Type 2 diabetes mellitus with hyperglycemia: Secondary | ICD-10-CM | POA: Diagnosis not present

## 2016-10-07 ENCOUNTER — Other Ambulatory Visit (HOSPITAL_COMMUNITY)
Admission: RE | Admit: 2016-10-07 | Discharge: 2016-10-07 | Disposition: A | Discharge status: Home / Self Care | Payer: Medicare HMO | Source: Ambulatory Visit | Attending: Family Medicine | Admitting: Family Medicine

## 2016-10-07 DIAGNOSIS — Z124 Encounter for screening for malignant neoplasm of cervix: Secondary | ICD-10-CM | POA: Insufficient documentation

## 2016-10-08 ENCOUNTER — Other Ambulatory Visit: Payer: Self-pay | Admitting: Family Medicine

## 2016-10-08 DIAGNOSIS — I10 Essential (primary) hypertension: Secondary | ICD-10-CM | POA: Diagnosis not present

## 2016-10-08 DIAGNOSIS — Z124 Encounter for screening for malignant neoplasm of cervix: Secondary | ICD-10-CM | POA: Diagnosis not present

## 2016-10-08 DIAGNOSIS — E7800 Pure hypercholesterolemia, unspecified: Secondary | ICD-10-CM | POA: Diagnosis not present

## 2016-10-08 DIAGNOSIS — Z7984 Long term (current) use of oral hypoglycemic drugs: Secondary | ICD-10-CM | POA: Diagnosis not present

## 2016-10-08 DIAGNOSIS — E1165 Type 2 diabetes mellitus with hyperglycemia: Secondary | ICD-10-CM | POA: Diagnosis not present

## 2016-10-08 DIAGNOSIS — Z0000 Encounter for general adult medical examination without abnormal findings: Secondary | ICD-10-CM | POA: Diagnosis not present

## 2016-10-08 DIAGNOSIS — E119 Type 2 diabetes mellitus without complications: Secondary | ICD-10-CM | POA: Diagnosis not present

## 2016-10-08 DIAGNOSIS — Z1389 Encounter for screening for other disorder: Secondary | ICD-10-CM | POA: Diagnosis not present

## 2016-10-09 LAB — CYTOLOGY - PAP: DIAGNOSIS: NEGATIVE

## 2016-12-22 DIAGNOSIS — Z1231 Encounter for screening mammogram for malignant neoplasm of breast: Secondary | ICD-10-CM | POA: Diagnosis not present

## 2017-01-15 DIAGNOSIS — E7800 Pure hypercholesterolemia, unspecified: Secondary | ICD-10-CM | POA: Diagnosis not present

## 2017-01-15 DIAGNOSIS — L989 Disorder of the skin and subcutaneous tissue, unspecified: Secondary | ICD-10-CM | POA: Diagnosis not present

## 2017-01-15 DIAGNOSIS — E1165 Type 2 diabetes mellitus with hyperglycemia: Secondary | ICD-10-CM | POA: Diagnosis not present

## 2017-01-15 DIAGNOSIS — I10 Essential (primary) hypertension: Secondary | ICD-10-CM | POA: Diagnosis not present

## 2017-01-15 DIAGNOSIS — Z7984 Long term (current) use of oral hypoglycemic drugs: Secondary | ICD-10-CM | POA: Diagnosis not present

## 2017-01-29 DIAGNOSIS — C44719 Basal cell carcinoma of skin of left lower limb, including hip: Secondary | ICD-10-CM | POA: Diagnosis not present

## 2017-03-12 DIAGNOSIS — D225 Melanocytic nevi of trunk: Secondary | ICD-10-CM | POA: Diagnosis not present

## 2017-03-12 DIAGNOSIS — Z1283 Encounter for screening for malignant neoplasm of skin: Secondary | ICD-10-CM | POA: Diagnosis not present

## 2017-03-12 DIAGNOSIS — L905 Scar conditions and fibrosis of skin: Secondary | ICD-10-CM | POA: Diagnosis not present

## 2017-03-12 DIAGNOSIS — D485 Neoplasm of uncertain behavior of skin: Secondary | ICD-10-CM | POA: Diagnosis not present

## 2017-03-12 DIAGNOSIS — B078 Other viral warts: Secondary | ICD-10-CM | POA: Diagnosis not present

## 2017-04-15 DIAGNOSIS — I10 Essential (primary) hypertension: Secondary | ICD-10-CM | POA: Diagnosis not present

## 2017-04-15 DIAGNOSIS — Z7984 Long term (current) use of oral hypoglycemic drugs: Secondary | ICD-10-CM | POA: Diagnosis not present

## 2017-04-15 DIAGNOSIS — E1165 Type 2 diabetes mellitus with hyperglycemia: Secondary | ICD-10-CM | POA: Diagnosis not present

## 2017-04-15 DIAGNOSIS — Z23 Encounter for immunization: Secondary | ICD-10-CM | POA: Diagnosis not present

## 2017-04-23 DIAGNOSIS — D485 Neoplasm of uncertain behavior of skin: Secondary | ICD-10-CM | POA: Diagnosis not present

## 2017-04-23 DIAGNOSIS — D225 Melanocytic nevi of trunk: Secondary | ICD-10-CM | POA: Diagnosis not present

## 2017-06-03 DIAGNOSIS — H9212 Otorrhea, left ear: Secondary | ICD-10-CM | POA: Diagnosis not present

## 2017-06-03 DIAGNOSIS — H9203 Otalgia, bilateral: Secondary | ICD-10-CM | POA: Diagnosis not present

## 2017-06-03 DIAGNOSIS — H6123 Impacted cerumen, bilateral: Secondary | ICD-10-CM | POA: Diagnosis not present

## 2017-10-14 DIAGNOSIS — E669 Obesity, unspecified: Secondary | ICD-10-CM | POA: Diagnosis not present

## 2017-10-14 DIAGNOSIS — Z6832 Body mass index (BMI) 32.0-32.9, adult: Secondary | ICD-10-CM | POA: Diagnosis not present

## 2017-10-14 DIAGNOSIS — E7800 Pure hypercholesterolemia, unspecified: Secondary | ICD-10-CM | POA: Diagnosis not present

## 2017-10-14 DIAGNOSIS — Z7984 Long term (current) use of oral hypoglycemic drugs: Secondary | ICD-10-CM | POA: Diagnosis not present

## 2017-10-14 DIAGNOSIS — E1169 Type 2 diabetes mellitus with other specified complication: Secondary | ICD-10-CM | POA: Diagnosis not present

## 2017-10-14 DIAGNOSIS — I10 Essential (primary) hypertension: Secondary | ICD-10-CM | POA: Diagnosis not present

## 2017-10-14 DIAGNOSIS — Z0000 Encounter for general adult medical examination without abnormal findings: Secondary | ICD-10-CM | POA: Diagnosis not present

## 2017-10-14 DIAGNOSIS — Z1389 Encounter for screening for other disorder: Secondary | ICD-10-CM | POA: Diagnosis not present

## 2017-12-23 DIAGNOSIS — Z1231 Encounter for screening mammogram for malignant neoplasm of breast: Secondary | ICD-10-CM | POA: Diagnosis not present

## 2018-04-14 DIAGNOSIS — E7800 Pure hypercholesterolemia, unspecified: Secondary | ICD-10-CM | POA: Diagnosis not present

## 2018-04-14 DIAGNOSIS — Z7984 Long term (current) use of oral hypoglycemic drugs: Secondary | ICD-10-CM | POA: Diagnosis not present

## 2018-04-14 DIAGNOSIS — E1165 Type 2 diabetes mellitus with hyperglycemia: Secondary | ICD-10-CM | POA: Diagnosis not present

## 2018-04-14 DIAGNOSIS — I10 Essential (primary) hypertension: Secondary | ICD-10-CM | POA: Diagnosis not present

## 2018-04-14 DIAGNOSIS — Z6831 Body mass index (BMI) 31.0-31.9, adult: Secondary | ICD-10-CM | POA: Diagnosis not present

## 2018-04-14 DIAGNOSIS — E669 Obesity, unspecified: Secondary | ICD-10-CM | POA: Diagnosis not present

## 2018-11-04 DIAGNOSIS — Z7984 Long term (current) use of oral hypoglycemic drugs: Secondary | ICD-10-CM | POA: Diagnosis not present

## 2018-11-04 DIAGNOSIS — E1165 Type 2 diabetes mellitus with hyperglycemia: Secondary | ICD-10-CM | POA: Diagnosis not present

## 2018-11-04 DIAGNOSIS — Z0000 Encounter for general adult medical examination without abnormal findings: Secondary | ICD-10-CM | POA: Diagnosis not present

## 2018-11-04 DIAGNOSIS — I10 Essential (primary) hypertension: Secondary | ICD-10-CM | POA: Diagnosis not present

## 2018-11-04 DIAGNOSIS — E669 Obesity, unspecified: Secondary | ICD-10-CM | POA: Diagnosis not present

## 2018-11-04 DIAGNOSIS — E7800 Pure hypercholesterolemia, unspecified: Secondary | ICD-10-CM | POA: Diagnosis not present

## 2018-11-04 DIAGNOSIS — Z1389 Encounter for screening for other disorder: Secondary | ICD-10-CM | POA: Diagnosis not present

## 2018-12-27 DIAGNOSIS — E1165 Type 2 diabetes mellitus with hyperglycemia: Secondary | ICD-10-CM | POA: Diagnosis not present

## 2018-12-27 DIAGNOSIS — I10 Essential (primary) hypertension: Secondary | ICD-10-CM | POA: Diagnosis not present

## 2018-12-29 DIAGNOSIS — Z1231 Encounter for screening mammogram for malignant neoplasm of breast: Secondary | ICD-10-CM | POA: Diagnosis not present

## 2019-05-31 DIAGNOSIS — E7800 Pure hypercholesterolemia, unspecified: Secondary | ICD-10-CM | POA: Diagnosis not present

## 2019-05-31 DIAGNOSIS — E119 Type 2 diabetes mellitus without complications: Secondary | ICD-10-CM | POA: Diagnosis not present

## 2019-05-31 DIAGNOSIS — I10 Essential (primary) hypertension: Secondary | ICD-10-CM | POA: Diagnosis not present

## 2019-05-31 DIAGNOSIS — E1169 Type 2 diabetes mellitus with other specified complication: Secondary | ICD-10-CM | POA: Diagnosis not present

## 2019-05-31 DIAGNOSIS — E1165 Type 2 diabetes mellitus with hyperglycemia: Secondary | ICD-10-CM | POA: Diagnosis not present

## 2019-06-29 DIAGNOSIS — N390 Urinary tract infection, site not specified: Secondary | ICD-10-CM | POA: Diagnosis not present

## 2019-06-29 DIAGNOSIS — R350 Frequency of micturition: Secondary | ICD-10-CM | POA: Diagnosis not present

## 2019-06-29 DIAGNOSIS — I10 Essential (primary) hypertension: Secondary | ICD-10-CM | POA: Diagnosis not present

## 2019-06-29 DIAGNOSIS — E1165 Type 2 diabetes mellitus with hyperglycemia: Secondary | ICD-10-CM | POA: Diagnosis not present

## 2019-06-29 DIAGNOSIS — E7800 Pure hypercholesterolemia, unspecified: Secondary | ICD-10-CM | POA: Diagnosis not present

## 2019-07-09 ENCOUNTER — Ambulatory Visit: Payer: Self-pay | Attending: Internal Medicine

## 2019-07-09 DIAGNOSIS — Z23 Encounter for immunization: Secondary | ICD-10-CM | POA: Insufficient documentation

## 2019-07-09 NOTE — Progress Notes (Signed)
   Covid-19 Vaccination Clinic  Name:  Katie Hawkins    MRN: [id number] DOB: 05/26/1951  07/09/2019  Katie Hawkins was observed post Covid-19 immunization for 15 minutes without incidence. He was provided with Vaccine Information Sheet and instruction to access the V-Safe system.   Katie Hawkins was instructed to call 911 with any severe reactions post vaccine: Marland Kitchen Difficulty breathing  . Swelling of your face and throat  . A fast heartbeat  . A bad rash all over your body  . Dizziness and weakness    Immunizations Administered    Name Date Dose VIS Date Route   Pfizer COVID-19 Vaccine 07/09/2019  9:38 AM 0.3 mL 05/06/2019 Intramuscular   Manufacturer: [place]   Lot: [id number]   [place]: [address]-4

## 2019-07-29 DIAGNOSIS — I10 Essential (primary) hypertension: Secondary | ICD-10-CM | POA: Diagnosis not present

## 2019-07-29 DIAGNOSIS — E1165 Type 2 diabetes mellitus with hyperglycemia: Secondary | ICD-10-CM | POA: Diagnosis not present

## 2019-07-29 DIAGNOSIS — E1169 Type 2 diabetes mellitus with other specified complication: Secondary | ICD-10-CM | POA: Diagnosis not present

## 2019-07-29 DIAGNOSIS — E119 Type 2 diabetes mellitus without complications: Secondary | ICD-10-CM | POA: Diagnosis not present

## 2019-07-29 DIAGNOSIS — E7800 Pure hypercholesterolemia, unspecified: Secondary | ICD-10-CM | POA: Diagnosis not present

## 2019-07-31 ENCOUNTER — Ambulatory Visit: Payer: Medicare HMO | Attending: Internal Medicine

## 2019-07-31 DIAGNOSIS — Z23 Encounter for immunization: Secondary | ICD-10-CM | POA: Insufficient documentation

## 2019-07-31 NOTE — Progress Notes (Signed)
   Covid-19 Vaccination Clinic  Name:  Katie Hawkins    MRN: [id number] DOB: 10/21/1950  07/31/2019  Katie Hawkins was observed post Covid-19 immunization for 15 minutes without incident. She was provided with Vaccine Information Sheet and instruction to access the V-Safe system.   Katie Hawkins was instructed to call 911 with any severe reactions post vaccine: Marland Kitchen Difficulty breathing  . Swelling of face and throat  . A fast heartbeat  . A bad rash all over body  . Dizziness and weakness   Immunizations Administered    Name Date Dose VIS Date Route   Pfizer COVID-19 Vaccine 07/31/2019  1:54 PM 0.3 mL 05/06/2019 Intramuscular   Manufacturer: [place]   Lot: [id number]   [place]: [id number]

## 2019-08-01 DIAGNOSIS — L03031 Cellulitis of right toe: Secondary | ICD-10-CM | POA: Diagnosis not present

## 2019-08-10 DIAGNOSIS — L02612 Cutaneous abscess of left foot: Secondary | ICD-10-CM | POA: Diagnosis not present

## 2019-08-10 DIAGNOSIS — E1169 Type 2 diabetes mellitus with other specified complication: Secondary | ICD-10-CM | POA: Diagnosis not present

## 2019-08-10 DIAGNOSIS — L602 Onychogryphosis: Secondary | ICD-10-CM | POA: Diagnosis not present

## 2019-08-10 DIAGNOSIS — L03032 Cellulitis of left toe: Secondary | ICD-10-CM | POA: Diagnosis not present

## 2019-08-25 DIAGNOSIS — E1169 Type 2 diabetes mellitus with other specified complication: Secondary | ICD-10-CM | POA: Diagnosis not present

## 2019-08-25 DIAGNOSIS — L03032 Cellulitis of left toe: Secondary | ICD-10-CM | POA: Diagnosis not present

## 2019-08-30 DIAGNOSIS — E1165 Type 2 diabetes mellitus with hyperglycemia: Secondary | ICD-10-CM | POA: Diagnosis not present

## 2019-08-30 DIAGNOSIS — E1169 Type 2 diabetes mellitus with other specified complication: Secondary | ICD-10-CM | POA: Diagnosis not present

## 2019-08-30 DIAGNOSIS — E7800 Pure hypercholesterolemia, unspecified: Secondary | ICD-10-CM | POA: Diagnosis not present

## 2019-08-30 DIAGNOSIS — E119 Type 2 diabetes mellitus without complications: Secondary | ICD-10-CM | POA: Diagnosis not present

## 2019-08-30 DIAGNOSIS — I10 Essential (primary) hypertension: Secondary | ICD-10-CM | POA: Diagnosis not present

## 2019-09-01 DIAGNOSIS — L03031 Cellulitis of right toe: Secondary | ICD-10-CM | POA: Diagnosis not present

## 2019-09-01 DIAGNOSIS — L03032 Cellulitis of left toe: Secondary | ICD-10-CM | POA: Diagnosis not present

## 2019-09-01 DIAGNOSIS — I739 Peripheral vascular disease, unspecified: Secondary | ICD-10-CM | POA: Diagnosis not present

## 2019-09-01 DIAGNOSIS — E1169 Type 2 diabetes mellitus with other specified complication: Secondary | ICD-10-CM | POA: Diagnosis not present

## 2019-09-15 DIAGNOSIS — I739 Peripheral vascular disease, unspecified: Secondary | ICD-10-CM | POA: Diagnosis not present

## 2019-09-15 DIAGNOSIS — I70245 Atherosclerosis of native arteries of left leg with ulceration of other part of foot: Secondary | ICD-10-CM | POA: Diagnosis not present

## 2019-09-15 DIAGNOSIS — I70203 Unspecified atherosclerosis of native arteries of extremities, bilateral legs: Secondary | ICD-10-CM | POA: Diagnosis not present

## 2019-09-15 DIAGNOSIS — I70223 Atherosclerosis of native arteries of extremities with rest pain, bilateral legs: Secondary | ICD-10-CM | POA: Diagnosis not present

## 2019-09-15 DIAGNOSIS — L97529 Non-pressure chronic ulcer of other part of left foot with unspecified severity: Secondary | ICD-10-CM | POA: Diagnosis not present

## 2019-10-18 DIAGNOSIS — E1165 Type 2 diabetes mellitus with hyperglycemia: Secondary | ICD-10-CM | POA: Diagnosis not present

## 2019-10-18 DIAGNOSIS — E1169 Type 2 diabetes mellitus with other specified complication: Secondary | ICD-10-CM | POA: Diagnosis not present

## 2019-10-18 DIAGNOSIS — I10 Essential (primary) hypertension: Secondary | ICD-10-CM | POA: Diagnosis not present

## 2019-10-18 DIAGNOSIS — E7800 Pure hypercholesterolemia, unspecified: Secondary | ICD-10-CM | POA: Diagnosis not present

## 2019-10-18 DIAGNOSIS — E119 Type 2 diabetes mellitus without complications: Secondary | ICD-10-CM | POA: Diagnosis not present

## 2019-11-07 DIAGNOSIS — I10 Essential (primary) hypertension: Secondary | ICD-10-CM | POA: Diagnosis not present

## 2019-11-07 DIAGNOSIS — E1169 Type 2 diabetes mellitus with other specified complication: Secondary | ICD-10-CM | POA: Diagnosis not present

## 2019-11-07 DIAGNOSIS — E7800 Pure hypercholesterolemia, unspecified: Secondary | ICD-10-CM | POA: Diagnosis not present

## 2019-11-07 DIAGNOSIS — Z0000 Encounter for general adult medical examination without abnormal findings: Secondary | ICD-10-CM | POA: Diagnosis not present

## 2019-11-07 DIAGNOSIS — Z1389 Encounter for screening for other disorder: Secondary | ICD-10-CM | POA: Diagnosis not present

## 2019-11-07 DIAGNOSIS — Z7984 Long term (current) use of oral hypoglycemic drugs: Secondary | ICD-10-CM | POA: Diagnosis not present

## 2019-11-07 DIAGNOSIS — Z23 Encounter for immunization: Secondary | ICD-10-CM | POA: Diagnosis not present

## 2019-11-07 DIAGNOSIS — E669 Obesity, unspecified: Secondary | ICD-10-CM | POA: Diagnosis not present

## 2019-11-11 DIAGNOSIS — E1165 Type 2 diabetes mellitus with hyperglycemia: Secondary | ICD-10-CM | POA: Diagnosis not present

## 2019-11-11 DIAGNOSIS — E7800 Pure hypercholesterolemia, unspecified: Secondary | ICD-10-CM | POA: Diagnosis not present

## 2019-11-11 DIAGNOSIS — E119 Type 2 diabetes mellitus without complications: Secondary | ICD-10-CM | POA: Diagnosis not present

## 2019-11-11 DIAGNOSIS — E1169 Type 2 diabetes mellitus with other specified complication: Secondary | ICD-10-CM | POA: Diagnosis not present

## 2019-11-11 DIAGNOSIS — I10 Essential (primary) hypertension: Secondary | ICD-10-CM | POA: Diagnosis not present

## 2020-01-04 DIAGNOSIS — Z1231 Encounter for screening mammogram for malignant neoplasm of breast: Secondary | ICD-10-CM | POA: Diagnosis not present

## 2020-02-23 DIAGNOSIS — E1165 Type 2 diabetes mellitus with hyperglycemia: Secondary | ICD-10-CM | POA: Diagnosis not present

## 2020-02-23 DIAGNOSIS — E119 Type 2 diabetes mellitus without complications: Secondary | ICD-10-CM | POA: Diagnosis not present

## 2020-02-23 DIAGNOSIS — E7800 Pure hypercholesterolemia, unspecified: Secondary | ICD-10-CM | POA: Diagnosis not present

## 2020-02-23 DIAGNOSIS — I10 Essential (primary) hypertension: Secondary | ICD-10-CM | POA: Diagnosis not present

## 2020-02-23 DIAGNOSIS — E1169 Type 2 diabetes mellitus with other specified complication: Secondary | ICD-10-CM | POA: Diagnosis not present

## 2020-03-09 DIAGNOSIS — Z20822 Contact with and (suspected) exposure to covid-19: Secondary | ICD-10-CM | POA: Diagnosis not present

## 2020-03-20 DIAGNOSIS — E1169 Type 2 diabetes mellitus with other specified complication: Secondary | ICD-10-CM | POA: Diagnosis not present

## 2020-03-20 DIAGNOSIS — E7800 Pure hypercholesterolemia, unspecified: Secondary | ICD-10-CM | POA: Diagnosis not present

## 2020-03-20 DIAGNOSIS — E1165 Type 2 diabetes mellitus with hyperglycemia: Secondary | ICD-10-CM | POA: Diagnosis not present

## 2020-03-20 DIAGNOSIS — I10 Essential (primary) hypertension: Secondary | ICD-10-CM | POA: Diagnosis not present

## 2020-03-20 DIAGNOSIS — E119 Type 2 diabetes mellitus without complications: Secondary | ICD-10-CM | POA: Diagnosis not present

## 2020-04-24 DIAGNOSIS — E1169 Type 2 diabetes mellitus with other specified complication: Secondary | ICD-10-CM | POA: Diagnosis not present

## 2020-04-24 DIAGNOSIS — I10 Essential (primary) hypertension: Secondary | ICD-10-CM | POA: Diagnosis not present

## 2020-04-24 DIAGNOSIS — E7800 Pure hypercholesterolemia, unspecified: Secondary | ICD-10-CM | POA: Diagnosis not present

## 2020-04-24 DIAGNOSIS — E1165 Type 2 diabetes mellitus with hyperglycemia: Secondary | ICD-10-CM | POA: Diagnosis not present

## 2020-04-24 DIAGNOSIS — E119 Type 2 diabetes mellitus without complications: Secondary | ICD-10-CM | POA: Diagnosis not present

## 2020-05-08 DIAGNOSIS — Z7984 Long term (current) use of oral hypoglycemic drugs: Secondary | ICD-10-CM | POA: Diagnosis not present

## 2020-05-08 DIAGNOSIS — E669 Obesity, unspecified: Secondary | ICD-10-CM | POA: Diagnosis not present

## 2020-05-08 DIAGNOSIS — E1169 Type 2 diabetes mellitus with other specified complication: Secondary | ICD-10-CM | POA: Diagnosis not present

## 2020-05-08 DIAGNOSIS — I10 Essential (primary) hypertension: Secondary | ICD-10-CM | POA: Diagnosis not present

## 2020-05-08 DIAGNOSIS — R399 Unspecified symptoms and signs involving the genitourinary system: Secondary | ICD-10-CM | POA: Diagnosis not present

## 2020-05-08 DIAGNOSIS — E7800 Pure hypercholesterolemia, unspecified: Secondary | ICD-10-CM | POA: Diagnosis not present

## 2020-05-14 DIAGNOSIS — E1169 Type 2 diabetes mellitus with other specified complication: Secondary | ICD-10-CM | POA: Diagnosis not present

## 2020-05-14 DIAGNOSIS — I10 Essential (primary) hypertension: Secondary | ICD-10-CM | POA: Diagnosis not present

## 2020-05-14 DIAGNOSIS — E7800 Pure hypercholesterolemia, unspecified: Secondary | ICD-10-CM | POA: Diagnosis not present

## 2020-10-01 DIAGNOSIS — H6123 Impacted cerumen, bilateral: Secondary | ICD-10-CM | POA: Diagnosis not present

## 2020-10-05 DIAGNOSIS — E7800 Pure hypercholesterolemia, unspecified: Secondary | ICD-10-CM | POA: Diagnosis not present

## 2020-10-05 DIAGNOSIS — E1165 Type 2 diabetes mellitus with hyperglycemia: Secondary | ICD-10-CM | POA: Diagnosis not present

## 2020-10-05 DIAGNOSIS — E119 Type 2 diabetes mellitus without complications: Secondary | ICD-10-CM | POA: Diagnosis not present

## 2020-10-05 DIAGNOSIS — I10 Essential (primary) hypertension: Secondary | ICD-10-CM | POA: Diagnosis not present

## 2020-10-05 DIAGNOSIS — E1169 Type 2 diabetes mellitus with other specified complication: Secondary | ICD-10-CM | POA: Diagnosis not present

## 2020-11-14 DIAGNOSIS — E7800 Pure hypercholesterolemia, unspecified: Secondary | ICD-10-CM | POA: Diagnosis not present

## 2020-11-14 DIAGNOSIS — Z0000 Encounter for general adult medical examination without abnormal findings: Secondary | ICD-10-CM | POA: Diagnosis not present

## 2020-11-14 DIAGNOSIS — Z7984 Long term (current) use of oral hypoglycemic drugs: Secondary | ICD-10-CM | POA: Diagnosis not present

## 2020-11-14 DIAGNOSIS — I10 Essential (primary) hypertension: Secondary | ICD-10-CM | POA: Diagnosis not present

## 2020-11-14 DIAGNOSIS — Z1389 Encounter for screening for other disorder: Secondary | ICD-10-CM | POA: Diagnosis not present

## 2020-11-14 DIAGNOSIS — E119 Type 2 diabetes mellitus without complications: Secondary | ICD-10-CM | POA: Diagnosis not present

## 2020-11-14 DIAGNOSIS — Z1159 Encounter for screening for other viral diseases: Secondary | ICD-10-CM | POA: Diagnosis not present

## 2020-11-14 DIAGNOSIS — Z1211 Encounter for screening for malignant neoplasm of colon: Secondary | ICD-10-CM | POA: Diagnosis not present

## 2020-11-21 DIAGNOSIS — I10 Essential (primary) hypertension: Secondary | ICD-10-CM | POA: Diagnosis not present

## 2020-11-21 DIAGNOSIS — E1169 Type 2 diabetes mellitus with other specified complication: Secondary | ICD-10-CM | POA: Diagnosis not present

## 2020-11-21 DIAGNOSIS — E119 Type 2 diabetes mellitus without complications: Secondary | ICD-10-CM | POA: Diagnosis not present

## 2020-11-21 DIAGNOSIS — E1165 Type 2 diabetes mellitus with hyperglycemia: Secondary | ICD-10-CM | POA: Diagnosis not present

## 2020-11-21 DIAGNOSIS — E7800 Pure hypercholesterolemia, unspecified: Secondary | ICD-10-CM | POA: Diagnosis not present

## 2021-01-09 DIAGNOSIS — Z1231 Encounter for screening mammogram for malignant neoplasm of breast: Secondary | ICD-10-CM | POA: Diagnosis not present

## 2021-01-29 DIAGNOSIS — Z1211 Encounter for screening for malignant neoplasm of colon: Secondary | ICD-10-CM | POA: Diagnosis not present

## 2021-02-22 DIAGNOSIS — E1169 Type 2 diabetes mellitus with other specified complication: Secondary | ICD-10-CM | POA: Diagnosis not present

## 2021-02-22 DIAGNOSIS — E119 Type 2 diabetes mellitus without complications: Secondary | ICD-10-CM | POA: Diagnosis not present

## 2021-02-22 DIAGNOSIS — E1165 Type 2 diabetes mellitus with hyperglycemia: Secondary | ICD-10-CM | POA: Diagnosis not present

## 2021-02-22 DIAGNOSIS — E7800 Pure hypercholesterolemia, unspecified: Secondary | ICD-10-CM | POA: Diagnosis not present

## 2021-02-22 DIAGNOSIS — I10 Essential (primary) hypertension: Secondary | ICD-10-CM | POA: Diagnosis not present

## 2021-04-25 DIAGNOSIS — Z7984 Long term (current) use of oral hypoglycemic drugs: Secondary | ICD-10-CM | POA: Diagnosis not present

## 2021-04-25 DIAGNOSIS — L989 Disorder of the skin and subcutaneous tissue, unspecified: Secondary | ICD-10-CM | POA: Diagnosis not present

## 2021-04-25 DIAGNOSIS — E7800 Pure hypercholesterolemia, unspecified: Secondary | ICD-10-CM | POA: Diagnosis not present

## 2021-04-25 DIAGNOSIS — D729 Disorder of white blood cells, unspecified: Secondary | ICD-10-CM | POA: Diagnosis not present

## 2021-04-25 DIAGNOSIS — Z23 Encounter for immunization: Secondary | ICD-10-CM | POA: Diagnosis not present

## 2021-04-25 DIAGNOSIS — E1169 Type 2 diabetes mellitus with other specified complication: Secondary | ICD-10-CM | POA: Diagnosis not present

## 2021-04-25 DIAGNOSIS — I10 Essential (primary) hypertension: Secondary | ICD-10-CM | POA: Diagnosis not present

## 2021-04-30 DIAGNOSIS — C4441 Basal cell carcinoma of skin of scalp and neck: Secondary | ICD-10-CM | POA: Diagnosis not present

## 2021-04-30 DIAGNOSIS — Z789 Other specified health status: Secondary | ICD-10-CM | POA: Diagnosis not present

## 2021-04-30 DIAGNOSIS — L298 Other pruritus: Secondary | ICD-10-CM | POA: Diagnosis not present

## 2021-04-30 DIAGNOSIS — L570 Actinic keratosis: Secondary | ICD-10-CM | POA: Diagnosis not present

## 2021-04-30 DIAGNOSIS — L538 Other specified erythematous conditions: Secondary | ICD-10-CM | POA: Diagnosis not present

## 2021-04-30 DIAGNOSIS — L821 Other seborrheic keratosis: Secondary | ICD-10-CM | POA: Diagnosis not present

## 2021-05-13 DIAGNOSIS — J069 Acute upper respiratory infection, unspecified: Secondary | ICD-10-CM | POA: Diagnosis not present

## 2021-05-22 DIAGNOSIS — E7800 Pure hypercholesterolemia, unspecified: Secondary | ICD-10-CM | POA: Diagnosis not present

## 2021-05-22 DIAGNOSIS — E1159 Type 2 diabetes mellitus with other circulatory complications: Secondary | ICD-10-CM | POA: Diagnosis not present

## 2021-05-22 DIAGNOSIS — I10 Essential (primary) hypertension: Secondary | ICD-10-CM | POA: Diagnosis not present

## 2021-05-22 DIAGNOSIS — E1169 Type 2 diabetes mellitus with other specified complication: Secondary | ICD-10-CM | POA: Diagnosis not present

## 2021-05-22 DIAGNOSIS — E1165 Type 2 diabetes mellitus with hyperglycemia: Secondary | ICD-10-CM | POA: Diagnosis not present

## 2021-06-12 DIAGNOSIS — R208 Other disturbances of skin sensation: Secondary | ICD-10-CM | POA: Diagnosis not present

## 2021-06-12 DIAGNOSIS — C44319 Basal cell carcinoma of skin of other parts of face: Secondary | ICD-10-CM | POA: Diagnosis not present

## 2021-06-12 DIAGNOSIS — L820 Inflamed seborrheic keratosis: Secondary | ICD-10-CM | POA: Diagnosis not present

## 2021-06-12 DIAGNOSIS — L298 Other pruritus: Secondary | ICD-10-CM | POA: Diagnosis not present

## 2021-06-12 DIAGNOSIS — Z789 Other specified health status: Secondary | ICD-10-CM | POA: Diagnosis not present

## 2021-06-12 DIAGNOSIS — L538 Other specified erythematous conditions: Secondary | ICD-10-CM | POA: Diagnosis not present

## 2021-07-17 DIAGNOSIS — L905 Scar conditions and fibrosis of skin: Secondary | ICD-10-CM | POA: Diagnosis not present

## 2021-07-17 DIAGNOSIS — L570 Actinic keratosis: Secondary | ICD-10-CM | POA: Diagnosis not present

## 2021-09-16 DIAGNOSIS — L905 Scar conditions and fibrosis of skin: Secondary | ICD-10-CM | POA: Diagnosis not present

## 2021-09-16 DIAGNOSIS — Z09 Encounter for follow-up examination after completed treatment for conditions other than malignant neoplasm: Secondary | ICD-10-CM | POA: Diagnosis not present

## 2021-09-16 DIAGNOSIS — Z872 Personal history of diseases of the skin and subcutaneous tissue: Secondary | ICD-10-CM | POA: Diagnosis not present

## 2021-09-16 DIAGNOSIS — L578 Other skin changes due to chronic exposure to nonionizing radiation: Secondary | ICD-10-CM | POA: Diagnosis not present

## 2021-09-18 DIAGNOSIS — E11628 Type 2 diabetes mellitus with other skin complications: Secondary | ICD-10-CM | POA: Diagnosis not present

## 2021-09-18 DIAGNOSIS — L089 Local infection of the skin and subcutaneous tissue, unspecified: Secondary | ICD-10-CM | POA: Diagnosis not present

## 2021-09-19 ENCOUNTER — Encounter (HOSPITAL_COMMUNITY): Payer: Self-pay | Admitting: Emergency Medicine

## 2021-09-19 ENCOUNTER — Encounter: Payer: Self-pay | Admitting: Podiatry

## 2021-09-19 ENCOUNTER — Emergency Department (HOSPITAL_COMMUNITY): Payer: Medicare HMO

## 2021-09-19 ENCOUNTER — Ambulatory Visit: Payer: Medicare HMO | Admitting: Podiatry

## 2021-09-19 ENCOUNTER — Other Ambulatory Visit: Payer: Self-pay

## 2021-09-19 ENCOUNTER — Emergency Department (HOSPITAL_COMMUNITY)
Admission: EM | Admit: 2021-09-19 | Discharge: 2021-09-19 | Disposition: A | Discharge status: Home / Self Care | Payer: Medicare HMO | Attending: Emergency Medicine | Admitting: Emergency Medicine

## 2021-09-19 ENCOUNTER — Ambulatory Visit (INDEPENDENT_AMBULATORY_CARE_PROVIDER_SITE_OTHER): Payer: Medicare HMO

## 2021-09-19 DIAGNOSIS — Z789 Other specified health status: Secondary | ICD-10-CM

## 2021-09-19 DIAGNOSIS — L97523 Non-pressure chronic ulcer of other part of left foot with necrosis of muscle: Secondary | ICD-10-CM | POA: Diagnosis not present

## 2021-09-19 DIAGNOSIS — D729 Disorder of white blood cells, unspecified: Secondary | ICD-10-CM | POA: Insufficient documentation

## 2021-09-19 DIAGNOSIS — R209 Unspecified disturbances of skin sensation: Secondary | ICD-10-CM

## 2021-09-19 DIAGNOSIS — M19072 Primary osteoarthritis, left ankle and foot: Secondary | ICD-10-CM | POA: Diagnosis not present

## 2021-09-19 DIAGNOSIS — Z7984 Long term (current) use of oral hypoglycemic drugs: Secondary | ICD-10-CM | POA: Diagnosis not present

## 2021-09-19 DIAGNOSIS — Z8571 Personal history of Hodgkin lymphoma: Secondary | ICD-10-CM | POA: Insufficient documentation

## 2021-09-19 DIAGNOSIS — Z79899 Other long term (current) drug therapy: Secondary | ICD-10-CM | POA: Insufficient documentation

## 2021-09-19 DIAGNOSIS — M216X2 Other acquired deformities of left foot: Secondary | ICD-10-CM | POA: Diagnosis not present

## 2021-09-19 DIAGNOSIS — E1165 Type 2 diabetes mellitus with hyperglycemia: Secondary | ICD-10-CM | POA: Insufficient documentation

## 2021-09-19 DIAGNOSIS — M79606 Pain in leg, unspecified: Secondary | ICD-10-CM

## 2021-09-19 DIAGNOSIS — E669 Obesity, unspecified: Secondary | ICD-10-CM | POA: Insufficient documentation

## 2021-09-19 DIAGNOSIS — L03032 Cellulitis of left toe: Secondary | ICD-10-CM | POA: Diagnosis not present

## 2021-09-19 DIAGNOSIS — E119 Type 2 diabetes mellitus without complications: Secondary | ICD-10-CM | POA: Diagnosis not present

## 2021-09-19 DIAGNOSIS — D126 Benign neoplasm of colon, unspecified: Secondary | ICD-10-CM | POA: Insufficient documentation

## 2021-09-19 DIAGNOSIS — M79672 Pain in left foot: Secondary | ICD-10-CM | POA: Diagnosis present

## 2021-09-19 LAB — BASIC METABOLIC PANEL
Anion gap: 9 (ref 5–15)
BUN: 12 mg/dL (ref 8–23)
CO2: 26 mmol/L (ref 22–32)
Calcium: 10 mg/dL (ref 8.9–10.3)
Chloride: 104 mmol/L (ref 98–111)
Creatinine, Ser: 0.73 mg/dL (ref 0.44–1.00)
GFR, Estimated: >60 mL/min (ref >60)
Glucose, Bld: 145 mg/dL — ABNORMAL HIGH (ref 70–99)
Potassium: 3.6 mmol/L (ref 3.5–5.1)
Sodium: 139 mmol/L (ref 135–145)

## 2021-09-19 LAB — CBC
HCT: 40.1 % (ref 36.0–46.0)
Hemoglobin: 12.9 g/dL (ref 12.0–15.0)
MCH: 29.8 pg (ref 26.0–34.0)
MCHC: 32.2 g/dL (ref 30.0–36.0)
MCV: 92.6 fL (ref 80.0–100.0)
Platelets: 392 10*3/uL (ref 150–400)
RBC: 4.33 MIL/uL (ref 3.87–5.11)
RDW: 13 % (ref 11.5–15.5)
WBC: 8.9 10*3/uL (ref 4.0–10.5)
nRBC: 0 % (ref 0.0–0.2)

## 2021-09-19 LAB — LACTIC ACID, PLASMA: Lactic Acid, Venous: 1.6 mmol/L (ref 0.5–1.9)

## 2021-09-19 LAB — C-REACTIVE PROTEIN: CRP: 0.8 mg/dL (ref <1.0)

## 2021-09-19 LAB — SEDIMENTATION RATE: Sed Rate: 30 mm/hr — ABNORMAL HIGH (ref 0–22)

## 2021-09-19 MED ORDER — DOXYCYCLINE HYCLATE 100 MG PO CAPS: 100.0000 mg | ORAL_CAPSULE | Freq: Two times a day (BID) | ORAL | 0 refills | Status: DC

## 2021-09-19 NOTE — ED Provider Triage Note (Signed)
Emergency Medicine Provider Triage Evaluation Note ? ?Katie Hawkins , a 71 y.o. female  was evaluated in triage.  Pt complains of 4th toe pain, blackened toe left foot.  By [facility] who sent her over here for nonpalpable pulses.  They did not attempt to Doppler these pulses.  Concern for developing osteo in context of hypertension, diabetic foot changes.  Patient denies systemic fever, chills, denies purulent drainage from the affected toe. ? ?Review of Systems  ?Positive: Toe injury ?Negative: Fever, chills ? ?Physical Exam  ?BP (!) 186/92 (BP Location: Right Arm)   Pulse (!) 106   Temp 98.8 ?F (37.1 ?C) (Oral)   Resp 16   SpO2 99%  ?Gen:   Awake, no distress   ?Resp:  Normal effort  ?MSK:   Moves extremities without difficulty  ?Other:  4th toe is black, necrotic. I can palpate and doppler DP/PT pulses on the affected foot. ? ?Medical Decision Making  ?Medically screening exam initiated at 5:06 PM.  Appropriate orders placed.  Sayler Mickiewicz was informed that the remainder of the evaluation will be completed by another provider, this initial triage assessment does not replace that evaluation, and the importance of remaining in the ED until their evaluation is complete. ? ?Workup initiated ?  ?Anselmo Pickler, PA-C ?09/19/21 1718 ? ?

## 2021-09-19 NOTE — ED Triage Notes (Signed)
Pt presents with redness, swelling, and blackened left 4th toe.  Started anbx yesterday. Went back to physician today since she had increased pain overnight, and they told her the pulses in that foot were "not strong enough"  ?

## 2021-09-19 NOTE — ED Provider Notes (Signed)
?[place] ?Provider Note ? ? ?CSN: [id number] ?Arrival date & time: 09/19/21  1624 ? ?  ? ?History ? ?Chief Complaint  ?Patient presents with  ? Foot Pain  ? ? ?Katie Hawkins is a 71 y.o. female. ? ? ?Foot Pain ? ?Patient is 71 year old female with a history of diabetes who presents to the emergency department for left third digit toe swelling and tenderness.  Patient reports that 2 weeks ago the nail fell off.  Since then she has been having progressively worsening swelling.  She was seen at an urgent care where she was prescribed Keflex yesterday.  She reported after taking the Keflex her pain has significantly improved.  However, patient continued to the redness and discoloration around the toe.  She denies associated fever or recent injury to the toe.  Denies problem with walking.  Denies any similar swelling in the past.  Otherwise no other complaints. ? ?Home Medications ?Prior to Admission medications   ?Medication Sig Start Date End Date Taking? Authorizing Provider  ?doxycycline (VIBRAMYCIN) 100 MG capsule Take 1 capsule (100 mg total) by mouth 2 (two) times daily. 09/19/21  Yes Donnamarie Poag, MD  ?Accu-Chek FastClix Lancets MISC  07/17/21   [provider]  ?cephALEXin (KEFLEX) 500 MG capsule Take 500 mg by mouth 4 (four) times daily. 09/18/21   [provider]  ?ezetimibe (ZETIA) 10 MG tablet Take 10 mg by mouth daily. 09/06/21   [provider]  ?glimepiride (AMARYL) 2 MG tablet Take 2 mg by mouth daily. 09/12/21   [provider]  ?glucose blood test strip 1 each by Other route as needed. Use as instructed. One Touch Delica Lancets used also.     [provider]  ?lisinopril (PRINIVIL,ZESTRIL) 10 MG tablet Take 10 mg by mouth daily.      [provider]  ?metFORMIN (GLUCOPHAGE-XR) 500 MG 24 hr tablet Take 500 mg by mouth daily. 07/17/21   [provider]  ?metoprolol succinate (TOPROL-XL) 25 MG 24 hr  tablet Take 25 mg by mouth daily. 01/10/15   [provider]  ?Multiple Vitamins-Minerals (MULTIVITAMIN GUMMIES ADULT PO) Take 2 tablets by mouth daily.    [provider]  ?pravastatin (PRAVACHOL) 40 MG tablet Take 40 mg by mouth Daily.  03/24/11   [provider]  ?   ? ?Allergies    ?Linagliptin   ? ?Review of Systems   ?Review of Systems ? ?Physical Exam ?Updated Vital Signs ?BP (!) 180/80   Pulse 100   Temp 98.6 ?F (37 ?C)   Resp 18   SpO2 99%  ?Physical Exam ?Vitals and nursing note reviewed.  ?Constitutional:   ?   General: She is not in acute distress. ?   Appearance: She is well-developed.  ?HENT:  ?   Head: Normocephalic and atraumatic.  ?Eyes:  ?   Conjunctiva/sclera: Conjunctivae normal.  ?Cardiovascular:  ?   Rate and Rhythm: Normal rate and regular rhythm.  ?   Heart sounds: No murmur heard. ?Pulmonary:  ?   Effort: Pulmonary effort is normal. No respiratory distress.  ?   Breath sounds: Normal breath sounds.  ?Abdominal:  ?   Palpations: Abdomen is soft.  ?   Tenderness: There is no abdominal tenderness.  ?Musculoskeletal:     ?   General: Swelling and tenderness present.  ?   Cervical back: Neck supple.  ?   Comments: Left third digit of the foot is swollen with small  area of discoloration at the tip, range of motion limited due to pain.  Patient distally neurovascular intact bilaterally in upper and lower extremity.  ?Skin: ?   General: Skin is warm and dry.  ?   Capillary Refill: Capillary refill takes less than 2 seconds.  ?Neurological:  ?   Mental Status: She is alert.  ?Psychiatric:     ?   Mood and Affect: Mood normal.  ? ? ?ED Results / Procedures / Treatments   ?Labs ?(all labs ordered are listed, but only abnormal results are displayed) ?Labs Reviewed  ?BASIC METABOLIC PANEL - Abnormal; Notable for the following components:  ?    Result Value  ? Glucose, Bld 145 (*)   ? All other components within normal limits  ?SEDIMENTATION RATE - Abnormal; Notable for the  following components:  ? Sed Rate 30 (*)   ? All other components within normal limits  ?CBC  ?C-REACTIVE PROTEIN  ?LACTIC ACID, PLASMA  ? ? ?EKG ?None ? ?Radiology ?DG Foot Complete Left ? ?Result Date: 09/19/2021 ?CLINICAL DATA:  Concern for osteo EXAM: LEFT FOOT - COMPLETE 3 tissue VIEW COMPARISON:  None. FINDINGS: There is no evidence of fracture or dislocation. Deformity of the distal left fifth metatarsal. No evidence of osseous destruction. There mild scattered degenerative changes of the IP joints. Soft tissue irregularity and soft tissue gas of the distal left third toe. IMPRESSION: 1. Soft tissue irregularity and soft tissue gas of the distal left third toe, concerning for infection with gas-forming organism. No evidence of osteomyelitis. 2. Deformity of the distal left fifth metatarsal, likely sequela of remote prior trauma. Electronically Signed   By: Yetta Glassman M.D.   On: 09/19/2021 18:01  ? ?DG Foot Complete Left ? ?Result Date: 09/19/2021 ?Please see detailed radiograph report in office note.  ? ?Procedures ?Procedures  ? ?Medications Ordered in ED ?Medications - No data to display ? ?ED Course/ Medical Decision Making/ A&P ?  ?                        ?Medical Decision Making ?Problems Addressed: ?Cellulitis of toe of left foot: acute illness or injury that poses a threat to life or bodily functions ? ?Amount and/or Complexity of Data Reviewed ?Labs: ordered. Decision-making details documented in ED Course. ?Radiology: ordered and independent interpretation performed. Decision-making details documented in ED Course. ? ?Risk ?Prescription drug management. ? ? ?Patient is a 71 year old female with a history of diabetes who presents to the emergency department for left third digit toe swelling.  Per chart review, patient was recently started on Keflex and has gotten 4 doses with symptom improvement.  Physical exam as above.  Vital signs are remarkable for mildly elevated blood pressure. ? ?Patient  presentation is consistent with soft tissue infection/cellulitis.  Less concern for abscess at this time.  Differential include osteomyelitis and possible necrotizing fasciitis.  However based on my exam these are less likely. ? ?Patient CBC without leukocytosis.  Hemoglobin hematocrit are stable.  CMP without severe metabolic derangement.  CRP is negative.  Lactic acid is unremarkable.  Sed rate is mildly elevated.  Her LIRNEC score for necrotizing fasciitis is very low.  I believe this is less likely at this time.  Her plain imaging of the left foot showed very small soft tissue irregularities/soft tissue gas at the distal aspect.  There is less concern for osteomyelitis.  The soft tissue gas is most likely communication from the outside  environment as seen on her exam.   ? ?Patient was able to ambulate without gait difficulty.  I believe given her symptom improvement with Keflex, we can add on Doxy for broader coverage and have her follow-up with her primary care provider.  Patient is happy with the plan and wants to go home.  Strict return precaution has been discussed. ? ?Final Clinical Impression(s) / ED Diagnoses ?Final diagnoses:  ?Cellulitis of toe of left foot  ? ? ?Rx / DC Orders ?ED Discharge Orders   ? ?      Ordered  ?  doxycycline (VIBRAMYCIN) 100 MG capsule  2 times daily       ? 09/19/21 2033  ? ?  ?  ? ?  ? ? ?  ?Donnamarie Poag, MD ?09/19/21 2328 ? ?  ?Carmin Muskrat, MD ?09/19/21 2342 ? ?

## 2021-09-19 NOTE — Progress Notes (Signed)
?Subjective:  ?Patient ID: Katie Hawkins, female    DOB: 04/02/1951,  MRN: [id number] ?HPI ?Chief Complaint  ?Patient presents with  ? Toe Pain  ?  3rd toe left - toe got red and swollen x 1.5 weeks ago, toenail fell off and then started to darken, PCP rx'd cephalexin and she's been soaking, no pain with walking, but very tender at night  ? Diabetes  ?  Last a1c was 8.6  ? New Patient (Initial Visit)  ? ? ?71 y.o. female presents with the above complaint.  ? ?ROS: Denies fever chills nausea vomiting muscle aches pains calf pain back pain chest pain shortness of breath.  She states that her toe got red and swollen about a week and a half ago the toenail fell off and she has been soaking and taking cephalexin. ? ?She states that she was told 2 years ago that she had poor circulation in her left leg and did nothing about it. ? ?Past Medical History:  ?Diagnosis Date  ? Diabetes mellitus   ? HTN (hypertension), benign 02/13/2012  ? Hypercholesterolemia   ? Hyperlipidemia   ? Hypertension   ? nodular lymphocyte predominate hodgkins lymphoma dx'd 10/2010  ? xrt comp 02/20/11  ? ?Past Surgical History:  ?Procedure Laterality Date  ? CESAREAN SECTION    ? 2  ? DILATION AND CURETTAGE OF UTERUS    ? ? ?Current Outpatient Medications:  ?  Accu-Chek FastClix Lancets MISC, , Disp: , Rfl:  ?  cephALEXin (KEFLEX) 500 MG capsule, Take 500 mg by mouth 4 (four) times daily., Disp: , Rfl:  ?  ezetimibe (ZETIA) 10 MG tablet, Take 10 mg by mouth daily., Disp: , Rfl:  ?  glimepiride (AMARYL) 2 MG tablet, Take 2 mg by mouth daily., Disp: , Rfl:  ?  glucose blood test strip, 1 each by Other route as needed. Use as instructed. One Touch Delica Lancets used also. , Disp: , Rfl:  ?  lisinopril (PRINIVIL,ZESTRIL) 10 MG tablet, Take 10 mg by mouth daily.  , Disp: , Rfl:  ?  metFORMIN (GLUCOPHAGE-XR) 500 MG 24 hr tablet, Take 500 mg by mouth daily., Disp: , Rfl:  ?  metoprolol succinate (TOPROL-XL) 25 MG 24 hr tablet, Take 25 mg by mouth daily.,  Disp: , Rfl: 0 ?  Multiple Vitamins-Minerals (MULTIVITAMIN GUMMIES ADULT PO), Take 2 tablets by mouth daily., Disp: , Rfl:  ?  pravastatin (PRAVACHOL) 40 MG tablet, Take 40 mg by mouth Daily. , Disp: , Rfl:  ? ?Allergies  ?Allergen Reactions  ? Linagliptin   ?  Other reaction(s): didn't like how she felt on this  ? ?Review of Systems ?Objective:  ?There were no vitals filed for this visit. ? ?General: Well developed, nourished, in no acute distress, alert and oriented x3  ? ?Dermatological: Skin is warm, dry and supple bilateral. Nails x 10 are well maintained; remaining integument appears unremarkable at this time. There are no open sores, no preulcerative lesions, no rash or signs of infection present. ? ?Vascular: Dorsalis Pedis artery and Posterior Tibial artery pedal pulses are 0/4 bilateral with delayed capillary fill time. Pedal hair growth present. No varicosities and no lower extremity edema present bilateral.  ? ?Neruologic: Grossly intact via light touch bilateral. Vibratory intact via tuning fork bilateral. Protective threshold with Semmes Wienstein monofilament intact to all pedal sites bilateral. Patellar and Achilles deep tendon reflexes 2+ bilateral. No Babinski or clonus noted bilateral.  ? ?Musculoskeletal: No gross boney pedal deformities bilateral.  No pain, crepitus, or limitation noted with foot and ankle range of motion bilateral. Muscular strength 5/5 in all groups tested bilateral.  Third toe left foot demonstrates eschar with foul odor cellulitis extending to the level of the mid diaphyseal metatarsal region.  Exquisitely tender on palpation and the leg is cool to the touch. ? ?Gait: Unassisted, Nonantalgic.  ? ? ?Radiographs: ? ?None taken ? ?Assessment & Plan:  ? 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 ?Plan: We sent her to the ER immediately and we called over to alert them that she was coming and that she is going to need to see vascular  immediately in order to save this foot.  They will be expecting her. ? ? ? ? ?Kenlie Seki T. [place], DPM ?

## 2021-09-30 ENCOUNTER — Encounter (HOSPITAL_BASED_OUTPATIENT_CLINIC_OR_DEPARTMENT_OTHER): Payer: Medicare HMO | Attending: General Surgery | Admitting: General Surgery

## 2021-09-30 DIAGNOSIS — E1165 Type 2 diabetes mellitus with hyperglycemia: Secondary | ICD-10-CM | POA: Insufficient documentation

## 2021-09-30 DIAGNOSIS — E1152 Type 2 diabetes mellitus with diabetic peripheral angiopathy with gangrene: Secondary | ICD-10-CM | POA: Diagnosis not present

## 2021-09-30 DIAGNOSIS — E11621 Type 2 diabetes mellitus with foot ulcer: Secondary | ICD-10-CM | POA: Insufficient documentation

## 2021-09-30 DIAGNOSIS — L97528 Non-pressure chronic ulcer of other part of left foot with other specified severity: Secondary | ICD-10-CM | POA: Diagnosis not present

## 2021-09-30 DIAGNOSIS — I10 Essential (primary) hypertension: Secondary | ICD-10-CM | POA: Insufficient documentation

## 2021-09-30 DIAGNOSIS — L97529 Non-pressure chronic ulcer of other part of left foot with unspecified severity: Secondary | ICD-10-CM | POA: Diagnosis not present

## 2021-09-30 DIAGNOSIS — I96 Gangrene, not elsewhere classified: Secondary | ICD-10-CM | POA: Diagnosis not present

## 2021-09-30 DIAGNOSIS — I70262 Atherosclerosis of native arteries of extremities with gangrene, left leg: Secondary | ICD-10-CM | POA: Insufficient documentation

## 2021-09-30 DIAGNOSIS — I739 Peripheral vascular disease, unspecified: Secondary | ICD-10-CM | POA: Diagnosis not present

## 2021-09-30 DIAGNOSIS — Z8571 Personal history of Hodgkin lymphoma: Secondary | ICD-10-CM | POA: Insufficient documentation

## 2021-09-30 DIAGNOSIS — L03032 Cellulitis of left toe: Secondary | ICD-10-CM | POA: Diagnosis not present

## 2021-09-30 NOTE — Progress Notes (Signed)
VIRAMONTES, Kataryna ([id number]) ?Visit Report for 09/30/2021 ?Allergy List Details ?Patient Name: Date of Service: ?THERIEN, [name] 09/30/2021 2:15 PM ?Medical Record Number: [id number] ?Patient Account Number: [id number] ?Date of Birth/Sex: Treating RN: ?1950/11/28 (71 y.o. Benjamine Sprague, [place] ?Primary Care Alaysiah Browder: Carol Ada Other Clinician: ?Referring Renleigh Ouellet: ?Treating Aminat Shelburne/Extender: Fredirick Maudlin ?Carol Ada ?Weeks in Treatment: 0 ?Allergies ?Active Allergies ?No Known Drug Allergies ?Allergy Notes ?Electronic Signature(s) ?Signed: 09/30/2021 5:39:57 PM By: Levan Hurst RN, BSN ?Entered By: Levan Hurst on 09/30/2021 14:28:20 ?-------------------------------------------------------------------------------- ?Arrival Information Details ?Patient Name: Date of Service: ?DALTO, [name] 09/30/2021 2:15 PM ?Medical Record Number: [id number] ?Patient Account Number: [id number] ?Date of Birth/Sex: Treating RN: ?May 03, 1951 (71 y.o. Benjamine Sprague, [place] ?Primary Care Seraj Dunnam: Carol Ada Other Clinician: ?Referring Gentri Guardado: ?Treating Khristine Verno/Extender: Fredirick Maudlin ?Carol Ada ?Weeks in Treatment: 0 ?Visit Information ?Patient Arrived: Ambulatory ?Arrival Time: 14:16 ?Accompanied By: alone ?Transfer Assistance: None ?Patient Identification Verified: Yes ?Secondary Verification Process Completed: Yes ?Patient Requires Transmission-Based Precautions: No ?Patient Has Alerts: No ?Electronic Signature(s) ?Signed: 09/30/2021 5:39:57 PM By: Levan Hurst RN, BSN ?Entered By: Levan Hurst on 09/30/2021 14:22:34 ?-------------------------------------------------------------------------------- ?Clinic Level of Care Assessment Details ?Patient Name: Date of Service: ?GANE, [name] 09/30/2021 2:15 PM ?Medical Record Number: [id number] ?Patient Account Number: [id number] ?Date of Birth/Sex: Treating RN: ?1951/01/22 (71 y.o. Benjamine Sprague, [place] ?Primary Care Seini Lannom: Carol Ada Other Clinician: ?Referring  Matheson Vandehei: ?Treating Jean Alejos/Extender: Fredirick Maudlin ?Carol Ada ?Weeks in Treatment: 0 ?Clinic Level of Care Assessment Items ?TOOL 2 Quantity Score ?X- 1 0 ?Use when only an EandM is performed on the INITIAL visit ?ASSESSMENTS - Nursing Assessment / Reassessment ?X- 1 20 ?General Physical Exam (combine w/ comprehensive assessment (listed just below) when performed on new pt. evals) ?X- 1 25 ?Comprehensive Assessment (HX, ROS, Risk Assessments, Wounds Hx, etc.) ?ASSESSMENTS - Wound and Skin A ssessment / Reassessment ?X - Simple Wound Assessment / Reassessment - one wound 1 5 ?'[]'$  - 0 ?Complex Wound Assessment / Reassessment - multiple wounds ?'[]'$  - 0 ?Dermatologic / Skin Assessment (not related to wound area) ?ASSESSMENTS - Ostomy and/or Continence Assessment and Care ?'[]'$  - 0 ?Incontinence Assessment and Management ?'[]'$  - 0 ?Ostomy Care Assessment and Management (repouching, etc.) ?PROCESS - Coordination of Care ?X - Simple Patient / Family Education for ongoing care 1 15 ?'[]'$  - 0 ?Complex (extensive) Patient / Family Education for ongoing care ?X- 1 10 ?Staff obtains Consents, Records, T Results / Process Orders ?est ?'[]'$  - 0 ?Staff telephones HHA, Nursing Homes / Clarify orders / etc ?'[]'$  - 0 ?Routine Transfer to another Facility (non-emergent condition) ?'[]'$  - 0 ?Routine Hospital Admission (non-emergent condition) ?X- 1 15 ?New Admissions / Biomedical engineer / Ordering NPWT Apligraf, etc. ?, ?'[]'$  - 0 ?Emergency Hospital Admission (emergent condition) ?X- 1 10 ?Simple Discharge Coordination ?'[]'$  - 0 ?Complex (extensive) Discharge Coordination ?PROCESS - Special Needs ?'[]'$  - 0 ?Pediatric / Minor Patient Management ?'[]'$  - 0 ?Isolation Patient Management ?'[]'$  - 0 ?Hearing / Language / Visual special needs ?'[]'$  - 0 ?Assessment of Community assistance (transportation, D/C planning, etc.) ?'[]'$  - 0 ?Additional assistance / Altered mentation ?'[]'$  - 0 ?Support Surface(s) Assessment (bed, cushion, seat,  etc.) ?INTERVENTIONS - Wound Cleansing / Measurement ?X- 1 5 ?Wound Imaging (photographs - any number of wounds) ?'[]'$  - 0 ?Wound Tracing (instead of photographs) ?X- 1 5 ?Simple Wound Measurement - one wound ?'[]'$  - 0 ?Complex Wound Measurement - multiple wounds ?X- 1 5 ?Simple Wound Cleansing - one wound ?'[]'$  - 0 ?Complex Wound Cleansing -  multiple wounds ?INTERVENTIONS - Wound Dressings ?X - Small Wound Dressing one or multiple wounds 1 10 ?'[]'$  - 0 ?Medium Wound Dressing one or multiple wounds ?'[]'$  - 0 ?Large Wound Dressing one or multiple wounds ?'[]'$  - 0 ?Application of Medications - injection ?INTERVENTIONS - Miscellaneous ?'[]'$  - 0 ?External ear exam ?'[]'$  - 0 ?Specimen Collection (cultures, biopsies, blood, body fluids, etc.) ?'[]'$  - 0 ?Specimen(s) / Culture(s) sent or taken to Lab for analysis ?'[]'$  - 0 ?Patient Transfer (multiple staff / Civil Service fast streamer / Similar devices) ?'[]'$  - 0 ?Simple Staple / Suture removal (25 or less) ?'[]'$  - 0 ?Complex Staple / Suture removal (26 or more) ?'[]'$  - 0 ?Hypo / Hyperglycemic Management (close monitor of Blood Glucose) ?X- 1 15 ?Ankle / Brachial Index (ABI) - do not check if billed separately ?Has the patient been seen at the hospital within the last three years: Yes ?Total Score: 140 ?Level Of Care: New/Established - Level 4 ?Electronic Signature(s) ?Signed: 09/30/2021 5:39:57 PM By: Levan Hurst RN, BSN ?Entered By: Levan Hurst on 09/30/2021 15:52:58 ?-------------------------------------------------------------------------------- ?Encounter Discharge Information Details ?Patient Name: Date of Service: ?WINDOM, [name] 09/30/2021 2:15 PM ?Medical Record Number: [id number] ?Patient Account Number: [id number] ?Date of Birth/Sex: Treating RN: ?[birth year] (71 y.o. Benjamine Sprague, [place] ?Primary Care Treyton Slimp: Carol Ada Other Clinician: ?Referring Hiya Point: ?Treating Karlton Maya/Extender: Fredirick Maudlin ?Carol Ada ?Weeks in Treatment: 0 ?Encounter Discharge Information Items ?Discharge Condition:  Stable ?Ambulatory Status: Ambulatory ?Discharge Destination: Home ?Transportation: Private Auto ?Accompanied By: alone ?Schedule Follow-up Appointment: Yes ?Clinical Summary of Care: Patient Declined ?Electronic Signature(s) ?Signed: 09/30/2021 5:39:57 PM By: Levan Hurst RN, BSN ?Entered By: Levan Hurst on 09/30/2021 15:53:38 ?-------------------------------------------------------------------------------- ?Lower Extremity Assessment Details ?Patient Name: Date of Service: ?[place]INGART, [name] 09/30/2021 2:15 PM ?Medical Record Number: [id number] ?Patient Account Number: [id number] ?Date of Birth/Sex: Treating RN: ?[birth year] (71 y.o. Benjamine Sprague, [place] ?Primary Care Jaylin Benzel: Carol Ada Other Clinician: ?Referring Marlayna Bannister: ?Treating Trinidad Petron/Extender: Fredirick Maudlin ?Carol Ada ?Weeks in Treatment: 0 ?Edema Assessment ?Assessed: [Left: No] [Right: No] ?Edema: [Left: N] [Right: o] ?Calf ?Left: Right: ?Point of Measurement: 28 cm From Medial Instep 27.5 cm ?Ankle ?Left: Right: ?Point of Measurement: 9 cm From Medial Instep 19 cm ?Vascular Assessment ?Pulses: ?Dorsalis Pedis ?Palpable: [Left:Yes] ?Blood Pressure: ?Brachial: [Left:145] ?Ankle: ?[Left:Dorsalis Pedis: 75 0.52] ?Electronic Signature(s) ?Signed: 09/30/2021 5:39:57 PM By: Levan Hurst RN, BSN ?Entered By: Levan Hurst on 09/30/2021 14:48:26 ?-------------------------------------------------------------------------------- ?Multi Wound Chart Details ?Patient Name: ?Date of Service: ?CASA, [name] 09/30/2021 2:15 PM ?Medical Record Number: [id number] ?Patient Account Number: [id number] ?Date of Birth/Sex: ?Treating RN: ?09-09-1950 (71 y.o. Benjamine Sprague, [place] ?Primary Care Yamili Lichtenwalner: Carol Ada ?Other Clinician: ?Referring Aubery Douthat: ?Treating Xian Alves/Extender: Fredirick Maudlin ?Carol Ada ?Weeks in Treatment: 0 ?Vital Signs ?Height(in): 63 ?Capillary Blood Glucose(mg/dl): 165 ?Weight(lbs): 165 ?Pulse(bpm): 108 ?Body Mass Index(BMI): 29.2 ?Blood  Pressure(mmHg): 145/85 ?Temperature(??F): 98.6 ?Respiratory Rate(breaths/min): 16 ?Photos: [N/A:N/A] ?Left T Third ?oe N/A N/A ?Wound Location: ?Gradually Appeared N/A N/A ?Wounding Event: ?Diabetic Wound/Ulcer of the Lower N/A N/A

## 2021-09-30 NOTE — Progress Notes (Signed)
PALKA, Katie Hawkins ([id number]) ?Visit Report for 09/30/2021 ?Chief Complaint Document Details ?Patient Name: Date of Service: ?[place], [name] 09/30/2021 2:15 PM ?Medical Record Number: [id number] ?Patient Account Number: [id number] ?Date of Birth/Sex: Treating RN: ?[birth year] (71 y.o. Katie Hawkins, Katie Hawkins ?Primary Care Provider: Carol Ada Other Clinician: ?Referring Provider: ?Treating Provider/Extender: Fredirick Maudlin ?Carol Ada ?Weeks in Treatment: 0 ?Information Obtained from: Patient ?Chief Complaint ?Patients presents for treatment of an open diabetic ulcer ?Electronic Signature(s) ?Signed: 09/30/2021 3:14:27 PM By: Fredirick Maudlin MD FACS ?Entered By: Fredirick Maudlin on 09/30/2021 15:14:27 ?-------------------------------------------------------------------------------- ?HPI Details ?Patient Name: Date of Service: ?[place], [name] 09/30/2021 2:15 PM ?Medical Record Number: [id number] ?Patient Account Number: [id number] ?Date of Birth/Sex: Treating RN: ?[birth year] (71 y.o. Katie Hawkins, Katie Hawkins ?Primary Care Provider: Carol Ada Other Clinician: ?Referring Provider: ?Treating Provider/Extender: Fredirick Maudlin ?Carol Ada ?Weeks in Treatment: 0 ?History of Present Illness ?HPI Description: ADMISSION ?09/30/2021 ?This is a 71 year old woman with past medical history significant for type 2 diabetes mellitus, hypertension, and a remote history of Hodgkin's disease. She ?presents to the wound care clinic with left third toe dry gangrene. She says that about 3 weeks ago, the toenail fell off and she developed some cellulitis. She ?saw her primary care provider on September 18, 2021 and they prescribed Keflex and referred her to podiatry. When she saw podiatry the next day, they could not ?feel or identify any pulses in that foot so she was sent to the emergency department. They were able to obtain dopplerable pulses. An x-ray was performed ?that did not show osteomyelitis, but there was some air seen in the soft tissue.  The radiologist interpreted this as possible concern for infection with a gas- ?forming organism, but the provider who saw the patient in the emergency room felt that this simply represented communication with the atmosphere. She was ?prescribed doxycycline, but no vascular consultation was obtained nor any further follow-up aside from the patient's primary care provider recommended. ?Today in clinic, her ABI was 0.52. 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 There is an area that feels a little bit soft and there is ?concern on my part as well as that of the intake nurse that this may progress to wet gangrene. ?Electronic Signature(s) ?Signed: 09/30/2021 3:25:43 PM By: Fredirick Maudlin MD FACS ?Entered By: Fredirick Maudlin on 09/30/2021 15:25:43 ?-------------------------------------------------------------------------------- ?Physical Exam Details ?Patient Name: Date of Service: ?[place], [name] 09/30/2021 2:15 PM ?Medical Record Number: [id number] ?Patient Account Number: [id number] ?Date of Birth/Sex: Treating RN: ?01-29-1951 (71 y.o. Katie Hawkins, Katie Hawkins ?Primary Care Provider: Carol Ada Other Clinician: ?Referring Provider: ?Treating Provider/Extender: Fredirick Maudlin ?Carol Ada ?Weeks in Treatment: 0 ?Constitutional ?Mildly hypertensive. Mildly tachycardic, asymptomatic. . . No acute distress. ?Respiratory ?Normal work of breathing on room air.Marland Kitchen ?Cardiovascular ?Unable to palpate distal pulses in the lower extremities.Marland Kitchen ?Notes ?09/30/2021: Her feet are warm, but distal pulses are nonpalpable. The tip of her left third toe is black. There is no frank drainage but the intake nurse reported ?some odor. The lateral aspect is a bit soft but I am unable to express any pus or other fluid. There is some mild erythema and swelling of the toe, but the ?patient reports that this is markedly better than it was before she initiated antibiotics. ?Electronic Signature(s) ?Signed: 09/30/2021  3:38:10 PM By: Fredirick Maudlin MD FACS ?Entered By: Fredirick Maudlin on 09/30/2021 15:38:10 ?-------------------------------------------------------------------------------- ?Physician Orders Details ?Patient Name: Date of Service: ?[place], [name] 09/30/2021 2:15 PM ?Medical Record Number: [id number] ?  Patient Account Number: [id number] ?Date of Birth/Sex: Treating RN: ?1950-11-12 (71 y.o. Katie Hawkins, Katie Hawkins ?Primary Care Provider: Carol Ada Other Clinician: ?Referring Provider: ?Treating Provider/Extender: Fredirick Maudlin ?Carol Ada ?Weeks in Treatment: 0 ?Verbal / Phone Orders: No ?Diagnosis Coding ?ICD-10 Coding ?Code Description ?I96 Gangrene, not elsewhere classified ?E11.65 Type 2 diabetes mellitus with hyperglycemia ?I73.9 Peripheral vascular disease, unspecified ?Follow-up Appointments ?ppointment in 1 week. - Dr. Celine Ahr - Room 4 - Tuesday 5/16 at 9:00 ?Return A ?Bathing/ Shower/ Hygiene ?May shower and wash wound with soap and water. ?Off-Loading ?Open toe surgical shoe to: - left foot ?Wound Treatment ?Wound #1 - T Third ?oe Wound Laterality: Left ?Cleanser: Soap and Water 1 x Per Day ?Discharge Instructions: May shower and wash wound with dial antibacterial soap and water prior to dressing change. ?Prim Dressing: Betadine 1 x Per Day ?ary ?Discharge Instructions: Paint toe with Betadine ?Secondary Dressing: Woven Gauze Sponges 2x2 in 1 x Per Day ?Discharge Instructions: Apply over primary dressing as directed. ?Services and Therapies ?Arterial Studies- Bilateral w/TBIs ?Electronic Signature(s) ?Signed: 09/30/2021 3:55:57 PM By: Fredirick Maudlin MD FACS ?Entered By: Fredirick Maudlin on 09/30/2021 15:38:58 ?-------------------------------------------------------------------------------- ?Problem List Details ?Patient Name: ?Date of Service: ?[place], [name] 09/30/2021 2:15 PM ?Medical Record Number: [id number] ?Patient Account Number: [id number] ?Date of Birth/Sex: ?Treating RN: ?[birth year] (71 y.o. Katie Hawkins, Katie Hawkins ?Primary Care Provider: Carol Ada ?Other Clinician: ?Referring Provider: ?Treating Provider/Extender: Fredirick Maudlin ?Carol Ada ?Weeks in Treatment: 0 ?Active Problems ?ICD-10 ?Encounter ?Code Description Active Date MDM ?Diagnosis ?I96 Gangrene, not elsewhere classified 09/30/2021 No Yes ?E11.65 Type 2 diabetes mellitus with hyperglycemia 09/30/2021 No Yes ?I73.9 Peripheral vascular disease, unspecified 09/30/2021 No Yes ?Inactive Problems ?Resolved Problems ?Electronic Signature(s) ?Signed: 09/30/2021 3:13:55 PM By: Fredirick Maudlin MD FACS ?Entered By: Fredirick Maudlin on 09/30/2021 15:13:55 ?-------------------------------------------------------------------------------- ?Progress Note Details ?Patient Name: ?Date of Service: ?[place], [name] 09/30/2021 2:15 PM ?Medical Record Number: [id number] ?Patient Account Number: [id number] ?Date of Birth/Sex: ?Treating RN: ?1951/04/28 (71 y.o. Katie Hawkins, Katie Hawkins ?Primary Care Provider: Carol Ada ?Other Clinician: ?Referring Provider: ?Treating Provider/Extender: Fredirick Maudlin ?Carol Ada ?Weeks in Treatment: 0 ?Subjective ?Chief Complaint ?Information obtained from Patient ?Patients presents for treatment of an open diabetic ulcer ?History of Present Illness (HPI) ?ADMISSION ?09/30/2021 ?This is a 71 year old woman with past medical history significant for type 2 diabetes mellitus, hypertension, and a remote history of Hodgkin's disease. She ?presents to the wound care clinic with left third toe dry gangrene. She says that about 3 weeks ago, the toenail fell off and she developed some cellulitis. She ?saw her primary care provider on September 18, 2021 and they prescribed Keflex and referred her to podiatry. When she saw podiatry the next day, they could not ?feel or identify any pulses in that foot so she was sent to the emergency department. They were able to obtain dopplerable pulses. An x-ray was performed ?that did not show osteomyelitis, but  there was some air seen in the soft tissue. The radiologist interpreted this as possible concern for infection with a gas- ?forming organism, but the provider who saw the patient in the emergency room felt tha

## 2021-09-30 NOTE — Progress Notes (Signed)
Katie Hawkins ([id number]) ?Visit Report for 09/30/2021 ?Abuse Risk Screen Details ?Patient Name: Date of Service: ?Katie Hawkins 09/30/2021 2:15 PM ?Medical Record Number: [id number] ?Patient Account Number: [id number] ?Date of Birth/Sex: Treating RN: ?[birth year] (71 y.o. Katie Hawkins, Katie Hawkins ?Primary Care [place]: Carol Ada Other Clinician: ?Referring Melis Trochez: ?Treating Favour Aleshire/Extender: Fredirick Maudlin ?Carol Ada ?Weeks in Treatment: 0 ?Abuse Risk Screen Items ?Answer ?ABUSE RISK SCREEN: ?Has anyone close to you tried to hurt or harm you recentlyo No ?Do you feel uncomfortable with anyone in your familyo No ?Has anyone forced you do things that you didnt want to doo No ?Electronic Signature(s) ?Signed: 09/30/2021 5:39:57 PM By: Levan Hurst RN, BSN ?Entered By: Levan Hurst on 09/30/2021 14:41:48 ?-------------------------------------------------------------------------------- ?Activities of Daily Living Details ?Patient Name: Date of Service: ?Katie Hawkins 09/30/2021 2:15 PM ?Medical Record Number: [id number] ?Patient Account Number: [id number] ?Date of Birth/Sex: Treating RN: ?[birth year] (71 y.o. Katie Hawkins, Katie Hawkins ?Primary Care Donell Tomkins: Carol Ada Other Clinician: ?Referring Amrom Ore: ?Treating Minor Iden/Extender: Fredirick Maudlin ?Carol Ada ?Weeks in Treatment: 0 ?Activities of Daily Living Items ?Answer ?Activities of Daily Living (Please select one for each item) ?[place] ?T Medications ?ake Completely Able ?Use T elephone Completely Able ?Care for Appearance Completely Able ?Use T oilet Completely Able ?Bath / Shower Completely Able ?Dress Self Completely Able ?Feed Self Completely Able ?Walk Completely Able ?Get In / Out Bed Completely Able ?Housework Completely Able ?Prepare Meals Completely Able ?Handle Money Completely Able ?Shop for Self Completely Able ?Electronic Signature(s) ?Signed: 09/30/2021 5:39:57 PM By: Levan Hurst RN, BSN ?Entered By: Levan Hurst  on 09/30/2021 14:42:09 ?-------------------------------------------------------------------------------- ?Education Screening Details ?Patient Name: ?Date of Service: ?Katie Hawkins 09/30/2021 2:15 PM ?Medical Record Number: [id number] ?Patient Account Number: [id number] ?Date of Birth/Sex: ?Treating RN: ?11/06/1950 (71 y.o. Katie Hawkins, Katie Hawkins ?Primary Care Andraya Frigon: Carol Ada ?Other Clinician: ?Referring Tanzania Basham: ?Treating Ricci Paff/Extender: Fredirick Maudlin ?Carol Ada ?Weeks in Treatment: 0 ?Primary Learner Assessed: Patient ?Learning Preferences/Education Level/Primary Language ?Learning Preference: Explanation, Demonstration, Printed Material ?Highest Education Level: College or Above ?Preferred Language: English ?Cognitive Barrier ?Language Barrier: No ?Translator Needed: No ?Memory Deficit: No ?Emotional Barrier: No ?Cultural/Religious Beliefs Affecting Medical Care: No ?Physical Barrier ?Impaired Vision: No ?Impaired Hearing: No ?Decreased Hand dexterity: No ?Knowledge/Comprehension ?Knowledge Level: High ?Comprehension Level: High ?Ability to understand written instructions: High ?Motivation ?Anxiety Level: Calm ?Cooperation: Cooperative ?Education Importance: Acknowledges Need ?Interest in Health Problems: Asks Questions ?Perception: Coherent ?Willingness to Engage in Self-Management High ?Activities: ?Readiness to Engage in Self-Management High ?Activities: ?Electronic Signature(s) ?Signed: 09/30/2021 5:39:57 PM By: Levan Hurst RN, BSN ?Entered By: Levan Hurst on 09/30/2021 14:42:39 ?-------------------------------------------------------------------------------- ?Fall Risk Assessment Details ?Patient Name: ?Date of Service: ?Katie Hawkins 09/30/2021 2:15 PM ?Medical Record Number: [id number] ?Patient Account Number: [id number] ?Date of Birth/Sex: ?Treating RN: ?08-28-1950 (70 y.o. Katie Hawkins, Katie Hawkins ?Primary Care Ryaan Vanwagoner: Carol Ada ?Other Clinician: ?Referring Donta Mcinroy: ?Treating  Maddyson Keil/Extender: Fredirick Maudlin ?Carol Ada ?Weeks in Treatment: 0 ?Fall Risk Assessment Items ?Have you had 2 or more falls in the last 12 monthso 0 No ?Have you had any fall that resulted in injury in the last 12 monthso 0 No ?FALLS RISK SCREEN ?History of falling - immediate or within 3 months 0 No ?Secondary diagnosis (Do you have 2 or more medical diagnoseso) 15 Yes ?Ambulatory aid ?None/bed rest/wheelchair/nurse 0 No ?Crutches/cane/walker 0 No ?Furniture 0 No ?Intravenous therapy Access/Saline/Heparin Lock 0 No ?Gait/Transferring ?Normal/ bed rest/ wheelchair 0 Yes ?Weak (short steps with or without shuffle, stooped but able to lift head while  walking, may seek 0 No ?support from furniture) ?Impaired (short steps with shuffle, may have difficulty arising from chair, head down, impaired 0 No ?balance) ?Mental Status ?Oriented to own ability 0 Yes ?Electronic Signature(s) ?Signed: 09/30/2021 5:39:57 PM By: Levan Hurst RN, BSN ?Entered By: Levan Hurst on 09/30/2021 14:42:58 ?-------------------------------------------------------------------------------- ?Foot Assessment Details ?Patient Name: ?Date of Service: ?Katie Hawkins 09/30/2021 2:15 PM ?Medical Record Number: [id number] ?Patient Account Number: [id number] ?Date of Birth/Sex: ?Treating RN: ?01-Jul-1950 (71 y.o. Katie Hawkins, Katie Hawkins ?Primary Care Laqueshia Cihlar: Carol Ada ?Other Clinician: ?Referring Taj Arteaga: ?Treating Debbe Crumble/Extender: Fredirick Maudlin ?Carol Ada ?Weeks in Treatment: 0 ?Foot Assessment Items ?Site Locations ?+ = Sensation present, - = Sensation absent, C = Callus, U = Ulcer ?R = Redness, W = Warmth, M = Maceration, PU = Pre-ulcerative lesion ?F = Fissure, S = Swelling, D = Dryness ?Assessment ?Right: Left: ?Other Deformity: No No ?Prior Foot Ulcer: No No ?Prior Amputation: No No ?Charcot Joint: No No ?Ambulatory Status: Ambulatory Without Help ?Gait: Steady ?Electronic Signature(s) ?Signed: 09/30/2021 5:39:57 PM By: Levan Hurst RN, BSN ?Entered By: Levan Hurst on 09/30/2021 14:44:02 ?-------------------------------------------------------------------------------- ?Nutrition Risk Screening Details ?Patient Name: ?Date of Service: ?[place], [place] 09/30/2021 2:15 PM ?Medical Record Number: [id number] ?Patient Account Number: [id number] ?Date of Birth/Sex: ?Treating RN: ?[birth year] (71 y.o. Katie Hawkins, Katie Hawkins ?Primary Care Sharlotte Baka: Carol Ada ?Other Clinician: ?Referring Tifany Hirsch: ?Treating Anie Juniel/Extender: Fredirick Maudlin ?Carol Ada ?Weeks in Treatment: 0 ?Height (in): 63 ?Weight (lbs): 165 ?Body Mass Index (BMI): 29.2 ?Nutrition Risk Screening Items ?Score Screening ?NUTRITION RISK SCREEN: ?I have an illness or condition that made me change the kind and/or amount of food I eat 2 Yes ?I eat fewer than two meals per day 0 No ?I eat few fruits and vegetables, or milk products 0 No ?I have three or more drinks of beer, liquor or wine almost every day 0 No ?I have tooth or mouth problems that make it hard for me to eat 0 No ?I don't always have enough money to buy the food I need 0 No ?I eat alone most of the time 0 No ?I take three or more different prescribed or over-the-counter drugs a day 1 Yes ?Without wanting to, I have lost or gained 10 pounds in the last six months 0 No ?I am not always physically able to shop, cook and/or feed myself 0 No ?Nutrition Protocols ?Good Risk Protocol ?Moderate Risk Protocol 0 Provide education on nutrition ?High Risk Proctocol ?Risk Level: Moderate Risk ?Score: 3 ?Electronic Signature(s) ?Signed: 09/30/2021 5:39:57 PM By: Levan Hurst RN, BSN ?Entered By: Levan Hurst on 09/30/2021 14:43:06 ?

## 2021-10-01 DIAGNOSIS — H35373 Puckering of macula, bilateral: Secondary | ICD-10-CM | POA: Diagnosis not present

## 2021-10-01 DIAGNOSIS — H2513 Age-related nuclear cataract, bilateral: Secondary | ICD-10-CM | POA: Diagnosis not present

## 2021-10-01 DIAGNOSIS — E113393 Type 2 diabetes mellitus with moderate nonproliferative diabetic retinopathy without macular edema, bilateral: Secondary | ICD-10-CM | POA: Diagnosis not present

## 2021-10-01 DIAGNOSIS — H43813 Vitreous degeneration, bilateral: Secondary | ICD-10-CM | POA: Diagnosis not present

## 2021-10-03 ENCOUNTER — Ambulatory Visit (HOSPITAL_COMMUNITY)
Admission: RE | Admit: 2021-10-03 | Discharge: 2021-10-03 | Disposition: A | Discharge status: Home / Self Care | Payer: Medicare HMO | Source: Ambulatory Visit | Attending: General Surgery | Admitting: General Surgery

## 2021-10-03 ENCOUNTER — Other Ambulatory Visit (HOSPITAL_COMMUNITY): Payer: Self-pay | Admitting: General Surgery

## 2021-10-03 DIAGNOSIS — I96 Gangrene, not elsewhere classified: Secondary | ICD-10-CM | POA: Insufficient documentation

## 2021-10-03 DIAGNOSIS — E11621 Type 2 diabetes mellitus with foot ulcer: Secondary | ICD-10-CM

## 2021-10-03 DIAGNOSIS — L97509 Non-pressure chronic ulcer of other part of unspecified foot with unspecified severity: Secondary | ICD-10-CM | POA: Diagnosis not present

## 2021-10-08 ENCOUNTER — Encounter (HOSPITAL_BASED_OUTPATIENT_CLINIC_OR_DEPARTMENT_OTHER): Payer: Medicare HMO | Admitting: General Surgery

## 2021-10-08 DIAGNOSIS — L97528 Non-pressure chronic ulcer of other part of left foot with other specified severity: Secondary | ICD-10-CM | POA: Diagnosis not present

## 2021-10-08 DIAGNOSIS — E11621 Type 2 diabetes mellitus with foot ulcer: Secondary | ICD-10-CM | POA: Diagnosis not present

## 2021-10-08 DIAGNOSIS — E1152 Type 2 diabetes mellitus with diabetic peripheral angiopathy with gangrene: Secondary | ICD-10-CM | POA: Diagnosis not present

## 2021-10-08 DIAGNOSIS — E1165 Type 2 diabetes mellitus with hyperglycemia: Secondary | ICD-10-CM | POA: Diagnosis not present

## 2021-10-08 DIAGNOSIS — I96 Gangrene, not elsewhere classified: Secondary | ICD-10-CM | POA: Diagnosis not present

## 2021-10-08 DIAGNOSIS — Z8571 Personal history of Hodgkin lymphoma: Secondary | ICD-10-CM | POA: Diagnosis not present

## 2021-10-08 DIAGNOSIS — L97529 Non-pressure chronic ulcer of other part of left foot with unspecified severity: Secondary | ICD-10-CM | POA: Diagnosis not present

## 2021-10-08 DIAGNOSIS — I10 Essential (primary) hypertension: Secondary | ICD-10-CM | POA: Diagnosis not present

## 2021-10-08 DIAGNOSIS — I7389 Other specified peripheral vascular diseases: Secondary | ICD-10-CM | POA: Diagnosis not present

## 2021-10-08 DIAGNOSIS — I70262 Atherosclerosis of native arteries of extremities with gangrene, left leg: Secondary | ICD-10-CM | POA: Diagnosis not present

## 2021-10-08 NOTE — Progress Notes (Signed)
Katie Hawkins ([id number]) ?Visit Report for 10/08/2021 ?Arrival Information Details ?Patient Name: Date of Service: ?Katie Hawkins 10/08/2021 9:00 A M ?Medical Record Number: [id number] ?Patient Account Number: [id number] ?Date of Birth/Sex: Treating RN: ?[birth year] (71 y.o. Katie Hawkins, Katie Hawkins ?Primary Care Nicholous Girgenti: Carol Ada Other Clinician: ?Referring Remijio Holleran: ?Treating Aniyla Harling/Extender: Fredirick Maudlin ?Carol Ada ?Weeks in Treatment: 1 ?Visit Information History Since Last Visit ?Added or deleted any medications: No ?Patient Arrived: Ambulatory ?Any new allergies or adverse reactions: No ?Arrival Time: 09:07 ?Had a fall or experienced change in No ?Accompanied By: alone ?activities of daily living that may affect ?Transfer Assistance: None ?risk of falls: ?Patient Identification Verified: Yes ?Signs or symptoms of abuse/neglect since last visito No ?Secondary Verification Process Completed: Yes ?Hospitalized since last visit: No ?Patient Requires Transmission-Based Precautions: No ?Implantable device outside of the clinic excluding No ?Patient Has Alerts: No ?cellular tissue based products placed in the center ?since last visit: ?Has Dressing in Place as Prescribed: Yes ?Pain Present Now: No ?Electronic Signature(s) ?Signed: 10/08/2021 5:45:24 PM By: Levan Hurst RN, BSN ?Entered By: Levan Hurst on 10/08/2021 09:07:39 ?-------------------------------------------------------------------------------- ?Clinic Level of Care Assessment Details ?Patient Name: Date of Service: ?Katie Hawkins 10/08/2021 9:00 A M ?Medical Record Number: [id number] ?Patient Account Number: [id number] ?Date of Birth/Sex: Treating RN: ?1951/04/26 (71 y.o. Katie Hawkins, Katie Hawkins ?Primary Care Eulamae Greenstein: Carol Ada Other Clinician: ?Referring Olie Dibert: ?Treating Rajon Bisig/Extender: Fredirick Maudlin ?Carol Ada ?Weeks in Treatment: 1 ?Clinic Level of Care Assessment Items ?TOOL 4 Quantity Score ?X- 1 0 ?Use when only an EandM  is performed on FOLLOW-UP visit ?ASSESSMENTS - Nursing Assessment / Reassessment ?X- 1 10 ?Reassessment of Co-morbidities (includes updates in patient status) ?X- 1 5 ?Reassessment of Adherence to Treatment Plan ?ASSESSMENTS - Wound and Skin A ssessment / Reassessment ?X - Simple Wound Assessment / Reassessment - one wound 1 5 ?'[]'$  - 0 ?Complex Wound Assessment / Reassessment - multiple wounds ?'[]'$  - 0 ?Dermatologic / Skin Assessment (not related to wound area) ?ASSESSMENTS - Focused Assessment ?'[]'$  - 0 ?Circumferential Edema Measurements - multi extremities ?'[]'$  - 0 ?Nutritional Assessment / Counseling / Intervention ?X- 1 5 ?Lower Extremity Assessment (monofilament, tuning fork, pulses) ?'[]'$  - 0 ?Peripheral Arterial Disease Assessment (using hand held doppler) ?ASSESSMENTS - Ostomy and/or Continence Assessment and Care ?'[]'$  - 0 ?Incontinence Assessment and Management ?'[]'$  - 0 ?Ostomy Care Assessment and Management (repouching, etc.) ?PROCESS - Coordination of Care ?X - Simple Patient / Family Education for ongoing care 1 15 ?'[]'$  - 0 ?Complex (extensive) Patient / Family Education for ongoing care ?X- 1 10 ?Staff obtains Consents, Records, T Results / Process Orders ?est ?'[]'$  - 0 ?Staff telephones HHA, Nursing Homes / Clarify orders / etc ?'[]'$  - 0 ?Routine Transfer to another Facility (non-emergent condition) ?'[]'$  - 0 ?Routine Hospital Admission (non-emergent condition) ?'[]'$  - 0 ?New Admissions / Biomedical engineer / Ordering NPWT Apligraf, etc. ?, ?'[]'$  - 0 ?Emergency Hospital Admission (emergent condition) ?X- 1 10 ?Simple Discharge Coordination ?'[]'$  - 0 ?Complex (extensive) Discharge Coordination ?PROCESS - Special Needs ?'[]'$  - 0 ?Pediatric / Minor Patient Management ?'[]'$  - 0 ?Isolation Patient Management ?'[]'$  - 0 ?Hearing / Language / Visual special needs ?'[]'$  - 0 ?Assessment of Community assistance (transportation, D/C planning, etc.) ?'[]'$  - 0 ?Additional assistance / Altered mentation ?'[]'$  - 0 ?Support Surface(s) Assessment  (bed, cushion, seat, etc.) ?INTERVENTIONS - Wound Cleansing / Measurement ?X - Simple Wound Cleansing - one wound 1 5 ?'[]'$  - 0 ?Complex Wound Cleansing - multiple wounds ?X-  1 5 ?Wound Imaging (photographs - any number of wounds) ?'[]'$  - 0 ?Wound Tracing (instead of photographs) ?X- 1 5 ?Simple Wound Measurement - one wound ?'[]'$  - 0 ?Complex Wound Measurement - multiple wounds ?INTERVENTIONS - Wound Dressings ?X - Small Wound Dressing one or multiple wounds 1 10 ?'[]'$  - 0 ?Medium Wound Dressing one or multiple wounds ?'[]'$  - 0 ?Large Wound Dressing one or multiple wounds ?'[]'$  - 0 ?Application of Medications - topical ?'[]'$  - 0 ?Application of Medications - injection ?INTERVENTIONS - Miscellaneous ?'[]'$  - 0 ?External ear exam ?'[]'$  - 0 ?Specimen Collection (cultures, biopsies, blood, body fluids, etc.) ?'[]'$  - 0 ?Specimen(s) / Culture(s) sent or taken to Lab for analysis ?'[]'$  - 0 ?Patient Transfer (multiple staff / Civil Service fast streamer / Similar devices) ?'[]'$  - 0 ?Simple Staple / Suture removal (25 or less) ?'[]'$  - 0 ?Complex Staple / Suture removal (26 or more) ?'[]'$  - 0 ?Hypo / Hyperglycemic Management (close monitor of Blood Glucose) ?'[]'$  - 0 ?Ankle / Brachial Index (ABI) - do not check if billed separately ?X- 1 5 ?Vital Signs ?Has the patient been seen at the hospital within the last three years: Yes ?Total Score: 90 ?Level Of Care: New/Established - Level 3 ?Electronic Signature(s) ?Signed: 10/08/2021 5:45:24 PM By: Levan Hurst RN, BSN ?Entered By: Levan Hurst on 10/08/2021 17:36:51 ?-------------------------------------------------------------------------------- ?Encounter Discharge Information Details ?Patient Name: Date of Service: ?Katie Hawkins 10/08/2021 9:00 A M ?Medical Record Number: [id number] ?Patient Account Number: [id number] ?Date of Birth/Sex: Treating RN: ?05/18/1951 (71 y.o. Katie Hawkins, Katie Hawkins ?Primary Care Bryssa Tones: Carol Ada Other Clinician: ?Referring Frankee Gritz: ?Treating Kyrel Leighton/Extender: Fredirick Maudlin ?Carol Ada ?Weeks in Treatment: 1 ?Encounter Discharge Information Items ?Discharge Condition: Stable ?Ambulatory Status: Ambulatory ?Discharge Destination: Home ?Transportation: Private Auto ?Accompanied By: alone ?Schedule Follow-up Appointment: Yes ?Clinical Summary of Care: Patient Declined ?Electronic Signature(s) ?Signed: 10/08/2021 5:45:24 PM By: Levan Hurst RN, BSN ?Entered By: Levan Hurst on 10/08/2021 17:36:27 ?-------------------------------------------------------------------------------- ?Lower Extremity Assessment Details ?Patient Name: Date of Service: ?PERRITT, [place] Hawkins 10/08/2021 9:00 A M ?Medical Record Number: [id number] ?Patient Account Number: [id number] ?Date of Birth/Sex: Treating RN: ?10-09-1950 (71 y.o. Katie Hawkins, Katie Hawkins ?Primary Care Lin Hackmann: Carol Ada Other Clinician: ?Referring Girard Koontz: ?Treating Prestin Munch/Extender: Fredirick Maudlin ?Carol Ada ?Weeks in Treatment: 1 ?Edema Assessment ?Assessed: [Left: No] [Right: No] ?Edema: [Left: N] [Right: o] ?Calf ?Left: Right: ?Point of Measurement: 28 cm From Medial Instep 27.5 cm ?Ankle ?Left: Right: ?Point of Measurement: 9 cm From Medial Instep 19 cm ?Vascular Assessment ?Pulses: ?Dorsalis Pedis ?Palpable: [Left:Yes] ?Electronic Signature(s) ?Signed: 10/08/2021 5:45:24 PM By: Levan Hurst RN, BSN ?Entered By: Levan Hurst on 10/08/2021 09:08:43 ?-------------------------------------------------------------------------------- ?Multi Wound Chart Details ?Patient Name: ?Date of Service: ?ENSLOW, [place] Hawkins 10/08/2021 9:00 A M ?Medical Record Number: [id number] ?Patient Account Number: [id number] ?Date of Birth/Sex: ?Treating RN: ?09/30/1950 (71 y.o. Katie Hawkins, Katie Hawkins ?Primary Care Larico Dimock: Carol Ada ?Other Clinician: ?Referring Eastin Swing: ?Treating Maleeah Crossman/Extender: Fredirick Maudlin ?Carol Ada ?Weeks in Treatment: 1 ?Vital Signs ?Height(in): 63 ?Capillary Blood Glucose(mg/dl): 147 ?Weight(lbs): 165 ?Pulse(bpm): 98 ?Body Mass  Index(BMI): 29.2 ?Blood Pressure(mmHg): 169/82 ?Temperature(??F): 98.4 ?Respiratory Rate(breaths/min): 18 ?Photos: [N/A:N/A] ?Left T Third ?oe N/A N/A ?Wound Location: ?Gradually Appeared N/A N/A ?Wounding Even

## 2021-10-09 NOTE — Progress Notes (Signed)
Katie Hawkins ([id number]) ?Visit Report for 10/08/2021 ?Chief Complaint Document Details ?Patient Name: Date of Service: ?Katie Hawkins 10/08/2021 9:00 A M ?Medical Record Number: [id number] ?Patient Account Number: [id number] ?Date of Birth/Sex: Treating RN: ?June 24, 1950 (71 y.o. Katie Hawkins, Katie Hawkins ?Primary Care Provider: Carol Ada Other Clinician: ?Referring Provider: ?Treating Provider/Extender: Fredirick Maudlin ?Carol Ada ?Weeks in Treatment: 1 ?Information Obtained from: Patient ?Chief Complaint ?Patients presents for treatment of an open diabetic ulcer ?Electronic Signature(s) ?Signed: 10/08/2021 9:43:43 AM By: Fredirick Maudlin MD FACS ?Entered By: Fredirick Maudlin on 10/08/2021 09:43:43 ?-------------------------------------------------------------------------------- ?HPI Details ?Patient Name: Date of Service: ?Katie Hawkins 10/08/2021 9:00 A M ?Medical Record Number: [id number] ?Patient Account Number: [id number] ?Date of Birth/Sex: Treating RN: ?12/22/1950 (71 y.o. Katie Hawkins, Katie Hawkins ?Primary Care Provider: Carol Ada Other Clinician: ?Referring Provider: ?Treating Provider/Extender: Fredirick Maudlin ?Carol Ada ?Weeks in Treatment: 1 ?History of Present Illness ?HPI Description: ADMISSION ?09/30/2021 ?This is a 71 year old woman with past medical history significant for type 2 diabetes mellitus, hypertension, and a remote history of Hodgkin's disease. She ?presents to the wound care clinic with left third toe dry gangrene. She says that about 3 weeks ago, the toenail fell off and she developed some cellulitis. She ?saw her primary care provider on September 18, 2021 and they prescribed Keflex and referred her to podiatry. When she saw podiatry the next day, they could not ?feel or identify any pulses in that foot so she was sent to the emergency department. They were able to obtain dopplerable pulses. An x-ray was performed ?that did not show osteomyelitis, but there was some air seen in the soft  tissue. The radiologist interpreted this as possible concern for infection with a gas- ?forming organism, but the provider who saw the patient in the emergency room felt that this simply represented communication with the atmosphere. She was ?prescribed doxycycline, but no vascular consultation was obtained nor any further follow-up aside from the patient's primary care provider recommended. ?Today in clinic, her ABI was 0.52. 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 There is an area that feels a little bit soft and there is ?concern on my part as well as that of the intake nurse that this may progress to wet gangrene. ?10/08/2021: The patient had formal ABIs done last week. ?ABI Findings: ?+---------+------------------+-----+--------+--------+ ?Right Rt Pressure (mmHg)IndexWaveformComment  ?+---------+------------------+-----+--------+--------+ ?Brachial 163     ?+---------+------------------+-----+--------+--------+ ?PTA 129 0.79 biphasic  ?+---------+------------------+-----+--------+--------+ ?DP 138 0.85 biphasic  ?+---------+------------------+-----+--------+--------+ ?Great T oe51 0.31    ?+---------+------------------+-----+--------+--------+ ?+---------+------------------+-----+----------+-------+ ?Left Lt Pressure (mmHg)IndexWaveform Comment ?+---------+------------------+-----+----------+-------+ ?Brachial 149     ?+---------+------------------+-----+----------+-------+ ?PTA 78 0.48 monophasic  ?+---------+------------------+-----+----------+-------+ ?DP 83 0.51 monophasic  ?+---------+------------------+-----+----------+-------+ ?Great T oe29 0.18    ?+---------+------------------+-----+----------+-------+ ?+-------+-----------+-----------+------------+------------+ ?ABI/TBIT oday's ABIT oday's TBIPrevious ABIPrevious TBI ?+-------+-----------+-----------+------------+------------+ ?Right 0.85 0.31     ?+-------+-----------+-----------+------------+------------+ ?Left 0.51 0.18    ?+-------+-----------+-----------+------------+------------+ ?Summary: ?Right: Resting right ankle-brachial index indicates mild right lower ?extremity arterial disease. The right toe-brachial index is abnormal. ?Left: Resting left ankle-brachial index indicates moderate left lower ?extremity arterial disease. The left toe-brachial index is abnormal. ?The toe is a bit less boggy today, but still has a foul odor that is appreciable upon entering the room. She has been painting it with Betadine daily. ?Electronic Signature(s) ?Signed: 10/08/2021 9:46:01 AM By: Fredirick Maudlin MD FACS ?Entered By: Fredirick Maudlin on 10/08/2021 09:46:01 ?-------------------------------------------------------------------------------- ?Physical Exam Details ?Patient Name: Date of Service: ?Katie Hawkins 10/08/2021 9:00 A M ?Medical Record Number: [id number] ?Patient Account Number: [id number] ?Date  of Birth/Sex: Treating RN: ?03/07/1951 (71 y.o. Katie Hawkins, Katie Hawkins ?Primary Care Provider: Carol Ada Other Clinician: ?Referring Provider: ?Treating Provider/Extender: Fredirick Maudlin ?Carol Ada ?Weeks in Treatment: 1 ?Constitutional ?She is hypertensive, but asymptomatic.. . . . No acute distress. ?Respiratory ?Normal work of breathing on room air. ?Notes ?10/08/2021: The toe is less boggy. She still has some erythema to the toe, but the foot itself is less red. There is still a strong odor coming from the digit. ?Electronic Signature(s) ?Signed: 10/08/2021 10:07:54 AM By: Fredirick Maudlin MD FACS ?Entered By: Fredirick Maudlin on 10/08/2021 10:07:54 ?-------------------------------------------------------------------------------- ?Physician Orders Details ?Patient Name: Date of Service: ?Katie Hawkins 10/08/2021 9:00 A M ?Medical Record Number: [id number] ?Patient Account Number: [id number] ?Date of Birth/Sex: ?Treating RN: ?[birth year] (71 y.o. Katie Hawkins, Katie Hawkins ?Primary Care Provider: ?Other Clinician: ?Carol Ada ?Referring Provider: ?Treating Provider/Extender: Fredirick Maudlin ?Carol Ada ?Weeks in Treatment: 1 ?Verbal / Phone Orders: No ?Diagnosis Coding ?ICD-10 Coding ?Code Description ?I96 Gangrene, not elsewhere classified ?E11.65 Type 2 diabetes mellitus with hyperglycemia ?I73.9 Peripheral vascular disease, unspecified ?Follow-up Appointments ?ppointment in 1 week. - Dr. Celine Ahr - Room 3 - Monday 5/22 at 1:15 ?Return A ?Bathing/ Shower/ Hygiene ?May shower and wash wound with soap and water. ?Off-Loading ?Open toe surgical shoe to: - left foot ?Wound Treatment ?Wound #1 - T Third ?oe Wound Laterality: Left ?Cleanser: Soap and Water 1 x Per Day ?Discharge Instructions: May shower and wash wound with dial antibacterial soap and water prior to dressing change. ?Prim Dressing: Betadine 1 x Per Day ?ary ?Discharge Instructions: Paint toe with Betadine ?Secondary Dressing: Woven Gauze Sponges 2x2 in 1 x Per Day ?Discharge Instructions: Apply over primary dressing as directed. ?Consults ?Vascular Surgeon - Abnormal arterial study 10/03/21, dry gangrene left 3rd toe - (ICD10 I96 - Gangrene, not elsewhere classified) ?Electronic Signature(s) ?Signed: 10/08/2021 12:37:10 PM By: Fredirick Maudlin MD FACS ?Entered By: Fredirick Maudlin on 10/08/2021 10:08:14 ?-------------------------------------------------------------------------------- ?Problem List Details ?Patient Name: ?Date of Service: ?STANG, [place] Hawkins 10/08/2021 9:00 A M ?Medical Record Number: [id number] ?Patient Account Number: [id number] ?Date of Birth/Sex: ?Treating RN: ?Jan 16, 1951 (71 y.o. Katie Hawkins, Katie Hawkins ?Primary Care Provider: Carol Ada ?Other Clinician: ?Referring Provider: ?Treating Provider/Extender: Fredirick Maudlin ?Carol Ada ?Weeks in Treatment: 1 ?Active Problems ?ICD-10 ?Encounter ?Code Description Active Date MDM ?Diagnosis ?I96 Gangrene, not elsewhere classified 09/30/2021 No  Yes ?E11.65 Type 2 diabetes mellitus with hyperglycemia 09/30/2021 No Yes ?I73.9 Peripheral vascular disease, unspecified 09/30/2021 No Yes ?Inactive Problems ?Resolved Problems ?Electronic Signature(s) ?Signed: 10/08/2021 9:43:29 AM By: Fredirick Maudlin MD FACS ?Entered By: Alethia Berthold

## 2021-10-14 ENCOUNTER — Encounter (HOSPITAL_BASED_OUTPATIENT_CLINIC_OR_DEPARTMENT_OTHER): Payer: Medicare HMO | Admitting: General Surgery

## 2021-10-14 NOTE — H&P (View-Only) (Signed)
[facility] [place]  ASSESSMENT / PLAN: Katie Hawkins is a 71 y.o. female with atherosclerosis of native arteries of left lower extremity causing gangrene.  Patient counseled patients with chronic limb threatening ischemia have an annual risk of cardiovascular mortality of 25% and a high risk of amputation.   WIfI score calculated based on clinical exam and non-invasive measurements. 2 / 3 / 0. Clinical grade IV. High risk of amputation. High potential benefit from revascularization.   Recommend the following which can slow the progression of atherosclerosis and reduce the risk of major adverse cardiac / limb events:  Complete cessation from all tobacco products. Blood glucose control with goal A1c < 7%. Blood pressure control with goal blood pressure < 140/90 mmHg. Lipid reduction therapy with goal LDL-C <100 mg/dL (<70 if symptomatic from PAD).  Aspirin '81mg'$  PO QD.  Atorvastatin 40-'80mg'$  PO QD (or other "high intensity" statin therapy).  Plan left lower extremity angiogram with possible intervention via right common femoral artery approach in cath lab 10/18/21.  CHIEF COMPLAINT: left toe gangrene  HISTORY OF PRESENT ILLNESS: Katie Hawkins is a 71 y.o. female referred to clinic from wound care center for evaluation of left third toe gangrene.  The patient reports a several week history of progressive gangrene patient reports no antecedent claudication or ischemic rest pain.  Had ulceration before.  She denies any previous vascular surgical intervention.  She is a smoker.  VASCULAR SURGICAL HISTORY: none  VASCULAR RISK FACTORS: Negative history of stroke / transient ischemic attack. Negative history of coronary artery disease.  Positive history of diabetes mellitus. Last A1c 8.6. Negative history of smoking.  Positive history of hypertension.  Negative history of chronic kidney disease.   Negative history of chronic obstructive pulmonary disease.  FUNCTIONAL  STATUS: ECOG performance status: (0) Fully active, able to carry on all predisease performance without restriction Ambulatory status: Ambulatory within the community without limits  Past Medical History:  Diagnosis Date   Diabetes mellitus    HTN (hypertension), benign 02/13/2012   Hypercholesterolemia    Hyperlipidemia    Hypertension    nodular lymphocyte predominate hodgkins lymphoma dx'd 10/2010   xrt comp 02/20/11    Past Surgical History:  Procedure Laterality Date   CESAREAN SECTION     2   DILATION AND CURETTAGE OF UTERUS      Family History  Problem Relation Age of Onset   Diabetes Father    Cancer Maternal Grandfather        lung   Cancer Paternal Grandfather        leukemia    Social History   Socioeconomic History   Marital status: Married    Spouse name: Not on file   Number of children: Not on file   Years of education: Not on file   Highest education level: Not on file  Occupational History   Not on file  Tobacco Use   Smoking status: Never   Smokeless tobacco: Never  Substance and Sexual Activity   Alcohol use: Yes    Alcohol/week: 0.0 standard drinks    Comment: occasionally   Drug use: No   Sexual activity: Not on file  Other Topics Concern   Not on file  Social History Narrative   Not on file   Social Determinants of Health   Financial Resource Strain: Not on file  Food Insecurity: Not on file  Transportation Needs: Not on file  Physical Activity: Not on file  Stress: Not on file  Social Connections: Not on file  Intimate Partner Violence: Not on file    Allergies  Allergen Reactions   Linagliptin     didn't like how she felt on this    Current Outpatient Medications  Medication Sig Dispense Refill   Accu-Chek FastClix Lancets MISC      ezetimibe (ZETIA) 10 MG tablet Take 10 mg by mouth daily.     glimepiride (AMARYL) 2 MG tablet Take 2 mg by mouth at bedtime.     glucose blood test strip 1 each by Other route as needed. Use  as instructed. One Touch Delica Lancets used also.      lisinopril (PRINIVIL,ZESTRIL) 10 MG tablet Take 10 mg by mouth daily.       metFORMIN (GLUCOPHAGE-XR) 500 MG 24 hr tablet Take 500 mg by mouth 2 (two) times daily.     metoprolol succinate (TOPROL-XL) 25 MG 24 hr tablet Take 25 mg by mouth daily.  0   Multiple Vitamins-Minerals (MULTIVITAMIN GUMMIES ADULT PO) Take 2 tablets by mouth daily.     pravastatin (PRAVACHOL) 40 MG tablet Take 40 mg by mouth Daily.      acetaminophen (TYLENOL) 500 MG tablet Take 1,000 mg by mouth at bedtime as needed for moderate pain.     No current facility-administered medications for this visit.    PHYSICAL EXAM Vitals:   10/15/21 0935  BP: (!) 182/87  Pulse: 90  Resp: 20  Temp: 98.6 F (37 C)  SpO2: 99%  Weight: 184 lb (83.5 kg)  Height: '5\' 3"'$  (1.6 m)    Constitutional: chronically ill appearing. no distress. Appears well nourished.  Cardiac: regular rate and rhythm.  Respiratory:  unlabored. Abdominal:  soft, non-tender, non-distended.  Peripheral vascular: 2+ femoral pulses. No palpable pedal pulses. Left third toe tip gangrene. Left heel ulcer which is small and shallow.  PERTINENT LABORATORY AND RADIOLOGIC DATA  Most recent CBC    Latest Ref Rng & Units 09/19/2021    5:25 PM 02/20/2014   11:59 AM 02/18/2013    8:07 AM  CBC  WBC 4.0 - 10.5 K/uL 8.9   8.2   7.5    Hemoglobin 12.0 - 15.0 g/dL 12.9   13.4   13.0    Hematocrit 36.0 - 46.0 % 40.1   41.0   39.6    Platelets 150 - 400 K/uL 392   318   282       Most recent CMP    Latest Ref Rng & Units 09/19/2021    5:25 PM 02/20/2014   11:59 AM 02/18/2013    8:07 AM  CMP  Glucose 70 - 99 mg/dL 145   176   160    BUN 8 - 23 mg/dL 12   16.6   14.1    Creatinine 0.44 - 1.00 mg/dL 0.73   0.7   0.7    Sodium 135 - 145 mmol/L 139   140   142    Potassium 3.5 - 5.1 mmol/L 3.6   3.7   4.2    Chloride 98 - 111 mmol/L 104      CO2 22 - 32 mmol/L '26   27   27    '$ Calcium 8.9 - 10.3 mg/dL 10.0    10.0   9.9    Total Protein 6.4 - 8.3 g/dL  7.6     Total Bilirubin 0.20 - 1.20 mg/dL  0.40     Alkaline Phos 40 - 150 U/L  94  AST 5 - 34 U/L  17     ALT 0 - 55 U/L  14       +-------+-----------+-----------+------------+------------+  ABI/TBIToday's ABIToday's TBIPrevious ABIPrevious TBI  +-------+-----------+-----------+------------+------------+  Right  0.85       0.31                                 +-------+-----------+-----------+------------+------------+  Left   0.51       0.18                                 +-------+-----------+-----------+------------+------------+   Yevonne Aline. Stanford Breed, MD [facility] Phone Number: [phone] 10/15/2021 4:16 PM  Total time spent on preparing this encounter including chart review, data review, collecting history, examining the patient, coordinating care for this new patient, 60 minutes.  Portions of this report may have been transcribed using voice recognition software.  Every effort has been made to ensure accuracy; however, inadvertent computerized transcription errors may still be present.

## 2021-10-14 NOTE — Progress Notes (Unsigned)
[facility] [place]  ASSESSMENT / PLAN: Katie Hawkins is a 71 y.o. female with atherosclerosis of native arteries of left lower extremity causing gangrene.  Patient counseled patients with chronic limb threatening ischemia have an annual risk of cardiovascular mortality of 25% and a high risk of amputation.   WIfI score calculated based on clinical exam and non-invasive measurements. 2 / 3 / 0. Clinical grade IV. High risk of amputation. High potential benefit from revascularization.   Recommend the following which can slow the progression of atherosclerosis and reduce the risk of major adverse cardiac / limb events:  Complete cessation from all tobacco products. Blood glucose control with goal A1c < 7%. Blood pressure control with goal blood pressure < 140/90 mmHg. Lipid reduction therapy with goal LDL-C <100 mg/dL (<70 if symptomatic from PAD).  Aspirin '81mg'$  PO QD.  Atorvastatin 40-'80mg'$  PO QD (or other "high intensity" statin therapy).  Plan left lower extremity angiogram with possible intervention via right common femoral artery approach in cath lab 10/18/21.  CHIEF COMPLAINT: left toe gangrene  HISTORY OF PRESENT ILLNESS: Katie Hawkins is a 71 y.o. female referred to clinic from wound care center for evaluation of right third toe gangrene.  The patient reports a several week history of progressive gangrene patient reports no antecedent claudication or ischemic rest pain.  Had ulceration before.  She denies any previous vascular surgical intervention.  She is a smoker.  VASCULAR SURGICAL HISTORY: none  VASCULAR RISK FACTORS: Negative history of stroke / transient ischemic attack. Negative history of coronary artery disease.  Positive history of diabetes mellitus. Last A1c 8.6. Negative history of smoking.  Positive history of hypertension.  Negative history of chronic kidney disease.   Negative history of chronic obstructive pulmonary  disease.  FUNCTIONAL STATUS: ECOG performance status: (0) Fully active, able to carry on all predisease performance without restriction Ambulatory status: Ambulatory within the community without limits  Past Medical History:  Diagnosis Date   Diabetes mellitus    HTN (hypertension), benign 02/13/2012   Hypercholesterolemia    Hyperlipidemia    Hypertension    nodular lymphocyte predominate hodgkins lymphoma dx'd 10/2010   xrt comp 02/20/11    Past Surgical History:  Procedure Laterality Date   CESAREAN SECTION     2   DILATION AND CURETTAGE OF UTERUS      Family History  Problem Relation Age of Onset   Diabetes Father    Cancer Maternal Grandfather        lung   Cancer Paternal Grandfather        leukemia    Social History   Socioeconomic History   Marital status: Married    Spouse name: Not on file   Number of children: Not on file   Years of education: Not on file   Highest education level: Not on file  Occupational History   Not on file  Tobacco Use   Smoking status: Never   Smokeless tobacco: Never  Substance and Sexual Activity   Alcohol use: Yes    Alcohol/week: 0.0 standard drinks    Comment: occasionally   Drug use: No   Sexual activity: Not on file  Other Topics Concern   Not on file  Social History Narrative   Not on file   Social Determinants of Health   Financial Resource Strain: Not on file  Food Insecurity: Not on file  Transportation Needs: Not on file  Physical Activity: Not on file  Stress: Not on file  Social Connections: Not on file  Intimate Partner Violence: Not on file    Allergies  Allergen Reactions   Linagliptin     didn't like how she felt on this    Current Outpatient Medications  Medication Sig Dispense Refill   Accu-Chek FastClix Lancets MISC      ezetimibe (ZETIA) 10 MG tablet Take 10 mg by mouth daily.     glimepiride (AMARYL) 2 MG tablet Take 2 mg by mouth at bedtime.     glucose blood test strip 1 each by  Other route as needed. Use as instructed. One Touch Delica Lancets used also.      lisinopril (PRINIVIL,ZESTRIL) 10 MG tablet Take 10 mg by mouth daily.       metFORMIN (GLUCOPHAGE-XR) 500 MG 24 hr tablet Take 500 mg by mouth 2 (two) times daily.     metoprolol succinate (TOPROL-XL) 25 MG 24 hr tablet Take 25 mg by mouth daily.  0   Multiple Vitamins-Minerals (MULTIVITAMIN GUMMIES ADULT PO) Take 2 tablets by mouth daily.     pravastatin (PRAVACHOL) 40 MG tablet Take 40 mg by mouth Daily.      acetaminophen (TYLENOL) 500 MG tablet Take 1,000 mg by mouth at bedtime as needed for moderate pain.     No current facility-administered medications for this visit.    PHYSICAL EXAM Vitals:   10/15/21 0935  BP: (!) 182/87  Pulse: 90  Resp: 20  Temp: 98.6 F (37 C)  SpO2: 99%  Weight: 184 lb (83.5 kg)  Height: '5\' 3"'$  (1.6 m)    Constitutional: chronically ill appearing. no distress. Appears well nourished.  Cardiac: regular rate and rhythm.  Respiratory:  unlabored. Abdominal:  soft, non-tender, non-distended.  Peripheral vascular: 2+ femoral pulses. No palpable pedal pulses. Right third toe tip gangrene. Right heel ulcer which is small and shallow.  PERTINENT LABORATORY AND RADIOLOGIC DATA  Most recent CBC    Latest Ref Rng & Units 09/19/2021    5:25 PM 02/20/2014   11:59 AM 02/18/2013    8:07 AM  CBC  WBC 4.0 - 10.5 K/uL 8.9   8.2   7.5    Hemoglobin 12.0 - 15.0 g/dL 12.9   13.4   13.0    Hematocrit 36.0 - 46.0 % 40.1   41.0   39.6    Platelets 150 - 400 K/uL 392   318   282       Most recent CMP    Latest Ref Rng & Units 09/19/2021    5:25 PM 02/20/2014   11:59 AM 02/18/2013    8:07 AM  CMP  Glucose 70 - 99 mg/dL 145   176   160    BUN 8 - 23 mg/dL 12   16.6   14.1    Creatinine 0.44 - 1.00 mg/dL 0.73   0.7   0.7    Sodium 135 - 145 mmol/L 139   140   142    Potassium 3.5 - 5.1 mmol/L 3.6   3.7   4.2    Chloride 98 - 111 mmol/L 104      CO2 22 - 32 mmol/L '26   27   27     '$ Calcium 8.9 - 10.3 mg/dL 10.0   10.0   9.9    Total Protein 6.4 - 8.3 g/dL  7.6     Total Bilirubin 0.20 - 1.20 mg/dL  0.40     Alkaline Phos 40 - 150 U/L  94  AST 5 - 34 U/L  17     ALT 0 - 55 U/L  14       +-------+-----------+-----------+------------+------------+  ABI/TBIToday's ABIToday's TBIPrevious ABIPrevious TBI  +-------+-----------+-----------+------------+------------+  Right  0.85       0.31                                 +-------+-----------+-----------+------------+------------+  Left   0.51       0.18                                 +-------+-----------+-----------+------------+------------+   Yevonne Aline. Stanford Breed, MD [facility] Phone Number: [phone] 10/15/2021 4:16 PM  Total time spent on preparing this encounter including chart review, data review, collecting history, examining the patient, coordinating care for this new patient, 60 minutes.  Portions of this report may have been transcribed using voice recognition software.  Every effort has been made to ensure accuracy; however, inadvertent computerized transcription errors may still be present.

## 2021-10-14 NOTE — H&P (View-Only) (Signed)
[facility] [place]  ASSESSMENT / PLAN: Katie Hawkins is a 71 y.o. female with atherosclerosis of native arteries of left lower extremity causing gangrene.  Patient counseled patients with chronic limb threatening ischemia have an annual risk of cardiovascular mortality of 25% and a high risk of amputation.   WIfI score calculated based on clinical exam and non-invasive measurements. 2 / 3 / 0. Clinical grade IV. High risk of amputation. High potential benefit from revascularization.   Recommend the following which can slow the progression of atherosclerosis and reduce the risk of major adverse cardiac / limb events:  Complete cessation from all tobacco products. Blood glucose control with goal A1c < 7%. Blood pressure control with goal blood pressure < 140/90 mmHg. Lipid reduction therapy with goal LDL-C <100 mg/dL (<70 if symptomatic from PAD).  Aspirin '81mg'$  PO QD.  Atorvastatin 40-'80mg'$  PO QD (or other "high intensity" statin therapy).  Plan left lower extremity angiogram with possible intervention via right common femoral artery approach in cath lab 10/18/21.  CHIEF COMPLAINT: left toe gangrene  HISTORY OF PRESENT ILLNESS: Katie Hawkins is a 70 y.o. female referred to clinic from wound care center for evaluation of left third toe gangrene.  The patient reports a several week history of progressive gangrene patient reports no antecedent claudication or ischemic rest pain.  Had ulceration before.  She denies any previous vascular surgical intervention.  She is a smoker.  VASCULAR SURGICAL HISTORY: none  VASCULAR RISK FACTORS: Negative history of stroke / transient ischemic attack. Negative history of coronary artery disease.  Positive history of diabetes mellitus. Last A1c 8.6. Negative history of smoking.  Positive history of hypertension.  Negative history of chronic kidney disease.   Negative history of chronic obstructive pulmonary disease.  FUNCTIONAL  STATUS: ECOG performance status: (0) Fully active, able to carry on all predisease performance without restriction Ambulatory status: Ambulatory within the community without limits  Past Medical History:  Diagnosis Date   Diabetes mellitus    HTN (hypertension), benign 02/13/2012   Hypercholesterolemia    Hyperlipidemia    Hypertension    nodular lymphocyte predominate hodgkins lymphoma dx'd 10/2010   xrt comp 02/20/11    Past Surgical History:  Procedure Laterality Date   CESAREAN SECTION     2   DILATION AND CURETTAGE OF UTERUS      Family History  Problem Relation Age of Onset   Diabetes Father    Cancer Maternal Grandfather        lung   Cancer Paternal Grandfather        leukemia    Social History   Socioeconomic History   Marital status: Married    Spouse name: Not on file   Number of children: Not on file   Years of education: Not on file   Highest education level: Not on file  Occupational History   Not on file  Tobacco Use   Smoking status: Never   Smokeless tobacco: Never  Substance and Sexual Activity   Alcohol use: Yes    Alcohol/week: 0.0 standard drinks    Comment: occasionally   Drug use: No   Sexual activity: Not on file  Other Topics Concern   Not on file  Social History Narrative   Not on file   Social Determinants of Health   Financial Resource Strain: Not on file  Food Insecurity: Not on file  Transportation Needs: Not on file  Physical Activity: Not on file  Stress: Not on file  Social Connections: Not on file  Intimate Partner Violence: Not on file    Allergies  Allergen Reactions   Linagliptin     didn't like how she felt on this    Current Outpatient Medications  Medication Sig Dispense Refill   Accu-Chek FastClix Lancets MISC      ezetimibe (ZETIA) 10 MG tablet Take 10 mg by mouth daily.     glimepiride (AMARYL) 2 MG tablet Take 2 mg by mouth at bedtime.     glucose blood test strip 1 each by Other route as needed. Use  as instructed. One Touch Delica Lancets used also.      lisinopril (PRINIVIL,ZESTRIL) 10 MG tablet Take 10 mg by mouth daily.       metFORMIN (GLUCOPHAGE-XR) 500 MG 24 hr tablet Take 500 mg by mouth 2 (two) times daily.     metoprolol succinate (TOPROL-XL) 25 MG 24 hr tablet Take 25 mg by mouth daily.  0   Multiple Vitamins-Minerals (MULTIVITAMIN GUMMIES ADULT PO) Take 2 tablets by mouth daily.     pravastatin (PRAVACHOL) 40 MG tablet Take 40 mg by mouth Daily.      acetaminophen (TYLENOL) 500 MG tablet Take 1,000 mg by mouth at bedtime as needed for moderate pain.     No current facility-administered medications for this visit.    PHYSICAL EXAM Vitals:   10/15/21 0935  BP: (!) 182/87  Pulse: 90  Resp: 20  Temp: 98.6 F (37 C)  SpO2: 99%  Weight: 184 lb (83.5 kg)  Height: '5\' 3"'$  (1.6 m)    Constitutional: chronically ill appearing. no distress. Appears well nourished.  Cardiac: regular rate and rhythm.  Respiratory:  unlabored. Abdominal:  soft, non-tender, non-distended.  Peripheral vascular: 2+ femoral pulses. No palpable pedal pulses. Left third toe tip gangrene. Left heel ulcer which is small and shallow.  PERTINENT LABORATORY AND RADIOLOGIC DATA  Most recent CBC    Latest Ref Rng & Units 09/19/2021    5:25 PM 02/20/2014   11:59 AM 02/18/2013    8:07 AM  CBC  WBC 4.0 - 10.5 K/uL 8.9   8.2   7.5    Hemoglobin 12.0 - 15.0 g/dL 12.9   13.4   13.0    Hematocrit 36.0 - 46.0 % 40.1   41.0   39.6    Platelets 150 - 400 K/uL 392   318   282       Most recent CMP    Latest Ref Rng & Units 09/19/2021    5:25 PM 02/20/2014   11:59 AM 02/18/2013    8:07 AM  CMP  Glucose 70 - 99 mg/dL 145   176   160    BUN 8 - 23 mg/dL 12   16.6   14.1    Creatinine 0.44 - 1.00 mg/dL 0.73   0.7   0.7    Sodium 135 - 145 mmol/L 139   140   142    Potassium 3.5 - 5.1 mmol/L 3.6   3.7   4.2    Chloride 98 - 111 mmol/L 104      CO2 22 - 32 mmol/L '26   27   27    '$ Calcium 8.9 - 10.3 mg/dL 10.0    10.0   9.9    Total Protein 6.4 - 8.3 g/dL  7.6     Total Bilirubin 0.20 - 1.20 mg/dL  0.40     Alkaline Phos 40 - 150 U/L  94  AST 5 - 34 U/L  17     ALT 0 - 55 U/L  14       +-------+-----------+-----------+------------+------------+  ABI/TBIToday's ABIToday's TBIPrevious ABIPrevious TBI  +-------+-----------+-----------+------------+------------+  Right  0.85       0.31                                 +-------+-----------+-----------+------------+------------+  Left   0.51       0.18                                 +-------+-----------+-----------+------------+------------+   Katie Hawkins. Stanford Breed, MD [facility] Phone Number: [phone] 10/15/2021 4:16 PM  Total time spent on preparing this encounter including chart review, data review, collecting history, examining the patient, coordinating care for this new patient, 60 minutes.  Portions of this report may have been transcribed using voice recognition software.  Every effort has been made to ensure accuracy; however, inadvertent computerized transcription errors may still be present.

## 2021-10-15 ENCOUNTER — Encounter: Payer: Self-pay | Admitting: Vascular Surgery

## 2021-10-15 ENCOUNTER — Other Ambulatory Visit: Payer: Self-pay

## 2021-10-15 ENCOUNTER — Ambulatory Visit: Payer: Medicare HMO | Admitting: Vascular Surgery

## 2021-10-15 VITALS — BP 182/87 | HR 90 | Temp 98.6°F | Resp 20 | Ht 63.0 in | Wt 184.0 lb

## 2021-10-15 DIAGNOSIS — I70262 Atherosclerosis of native arteries of extremities with gangrene, left leg: Secondary | ICD-10-CM

## 2021-10-17 DIAGNOSIS — I70262 Atherosclerosis of native arteries of extremities with gangrene, left leg: Secondary | ICD-10-CM | POA: Diagnosis not present

## 2021-10-17 NOTE — Op Note (Signed)
DATE OF SERVICE: 10/17/2021  PATIENT:  Katie Hawkins  71 y.o. female  PRE-OPERATIVE DIAGNOSIS:  Atherosclerosis of native arteries of left lower extremity causing gangrene  POST-OPERATIVE DIAGNOSIS:  Same  PROCEDURE:   1) Ultrasound guided right common femoral artery access 2) Aortogram 3) Left lower extremity angiogram with second order cannulation  4) Right lower extremity angiogram (193m total contrast) 5) Conscious sedation (31 minutes)  SURGEON:  TYevonne Aline HStanford Breed MD  ASSISTANT: none  ANESTHESIA:   local and IV sedation  ESTIMATED BLOOD LOSS: minimal  LOCAL MEDICATIONS USED:  LIDOCAINE   COUNTS: confirmed correct.  PATIENT DISPOSITION:  PACU - hemodynamically stable.   Delay start of Pharmacological VTE agent (>24hrs) due to surgical blood loss or risk of bleeding: no  INDICATION FOR PROCEDURE: Katie Nunciois a 71y.o. female with dry gangrene of the left foot. Non-invasive vascular testing suggests severe peripheral arterial disease in the left lower extremity. After careful discussion of risks, benefits, and alternatives the patient was offered angiography. The patient understood and wished to proceed.  OPERATIVE FINDINGS:  Terminal aorta and iliac arteries: Tortuous but widely patent without stenosis  Right lower extremity: Common femoral artery: patent without significant stenosis  Profunda femoris artery: patent without significant stenosis   Superficial femoral artery: patent without significant stenosis  Popliteal artery: occluded behind and below the knee Anterior tibial artery: occluded Tibioperoneal trunk: patent Peroneal artery: patent, courses to the foot, fills the pedal arch Posterior tibial artery: occluded Pedal circulation: intact pedal arch  Left lower extremity: Common femoral artery: patent without significant stenosis  Profunda femoris artery: patent without significant stenosis   Superficial femoral artery: occluded in mid  thigh Popliteal artery: occluded Anterior tibial artery: occluded Tibioperoneal trunk: occluded Peroneal artery: patent near its origin, courses to the foot, fills the pedal arch Posterior tibial artery: occluded Pedal circulation: intact pedal arch  GLASS score. FP 4. IP 1. P 0. Stage III. High complexity disease.   WIfI score. W 2 / I 3 / FI 1. Clinical stage IV. High risk of amputation. Potential benefit of revascularization high.  DESCRIPTION OF PROCEDURE: After identification of the patient in the pre-operative holding area, the patient was transferred to the operating room. The patient was positioned supine on the operating room table. Anesthesia was induced. The groins was prepped and draped in standard fashion. A surgical pause was performed confirming correct patient, procedure, and operative location.  The right groin was anesthetized with subcutaneous injection of 1% lidocaine. Using ultrasound guidance, the right common femoral artery was accessed with micropuncture technique. Fluoroscopy was used to confirm cannulation over the femoral head. The 58F sheath was upsized to 71F.   A Benson wire was advanced into the distal aorta. Over the wire an omni flush catheter was advanced to the level of L2. Aortogram was performed - see above for details.  The left common iliac artery was selected with an omniflush catheter and glidewire advantage guidewire. The wire was advanced into the common femoral artery. Over the wire the omni flush catheter was advanced into the external iliac artery. Selective angiography was performed - see above for details.   Retrograde angiography was performed of the right leg.  A mynx device was used to close the arteriotomy. Hemostasis was excellent upon completion.  Conscious sedation was administered with the use of IV fentanyl and midazolam under continuous physician and nurse monitoring.  Heart rate, blood pressure, and oxygen saturation were continuously  monitored.  Total sedation time was  31 minutes  Upon completion of the case instrument and sharps counts were confirmed correct. The patient was transferred to the PACU in good condition. I was present for all portions of the procedure.  PLAN: ASA '81mg'$  PO QD indefinitely. High intensity statin therapy. Needs left femoral-peroneal bypass. Will check vein mapping today.   Katie Aline. Stanford Breed, MD [facility] Phone Number: [phone] 10/17/2021 7:50 PM

## 2021-10-18 ENCOUNTER — Ambulatory Visit (HOSPITAL_COMMUNITY)
Admission: RE | Admit: 2021-10-18 | Discharge: 2021-10-18 | Disposition: A | Discharge status: Home / Self Care | Payer: Medicare HMO | Attending: Vascular Surgery | Admitting: Vascular Surgery

## 2021-10-18 ENCOUNTER — Encounter (HOSPITAL_COMMUNITY)
Admission: RE | Disposition: A | Discharge status: Home / Self Care | Payer: Self-pay | Source: Home / Self Care | Attending: Vascular Surgery

## 2021-10-18 ENCOUNTER — Ambulatory Visit (HOSPITAL_BASED_OUTPATIENT_CLINIC_OR_DEPARTMENT_OTHER): Payer: Medicare HMO

## 2021-10-18 ENCOUNTER — Other Ambulatory Visit: Payer: Self-pay

## 2021-10-18 ENCOUNTER — Encounter (HOSPITAL_COMMUNITY): Payer: Self-pay | Admitting: Vascular Surgery

## 2021-10-18 DIAGNOSIS — Z7982 Long term (current) use of aspirin: Secondary | ICD-10-CM | POA: Insufficient documentation

## 2021-10-18 DIAGNOSIS — I70262 Atherosclerosis of native arteries of extremities with gangrene, left leg: Secondary | ICD-10-CM | POA: Diagnosis not present

## 2021-10-18 DIAGNOSIS — I10 Essential (primary) hypertension: Secondary | ICD-10-CM | POA: Diagnosis not present

## 2021-10-18 DIAGNOSIS — E1152 Type 2 diabetes mellitus with diabetic peripheral angiopathy with gangrene: Secondary | ICD-10-CM | POA: Insufficient documentation

## 2021-10-18 DIAGNOSIS — N186 End stage renal disease: Secondary | ICD-10-CM | POA: Diagnosis not present

## 2021-10-18 DIAGNOSIS — Z01810 Encounter for preprocedural cardiovascular examination: Secondary | ICD-10-CM

## 2021-10-18 DIAGNOSIS — Z7984 Long term (current) use of oral hypoglycemic drugs: Secondary | ICD-10-CM | POA: Diagnosis not present

## 2021-10-18 DIAGNOSIS — Z79899 Other long term (current) drug therapy: Secondary | ICD-10-CM | POA: Insufficient documentation

## 2021-10-18 DIAGNOSIS — F17210 Nicotine dependence, cigarettes, uncomplicated: Secondary | ICD-10-CM | POA: Diagnosis not present

## 2021-10-18 HISTORY — PX: ABDOMINAL AORTOGRAM W/LOWER EXTREMITY: CATH118223

## 2021-10-18 LAB — GLUCOSE, CAPILLARY: Glucose-Capillary: 148 mg/dL — ABNORMAL HIGH (ref 70–99)

## 2021-10-18 LAB — POCT I-STAT, CHEM 8
BUN: 14 mg/dL (ref 8–23)
Calcium, Ion: 1.25 mmol/L (ref 1.15–1.40)
Chloride: 99 mmol/L (ref 98–111)
Creatinine, Ser: 0.5 mg/dL (ref 0.44–1.00)
Glucose, Bld: 179 mg/dL — ABNORMAL HIGH (ref 70–99)
HCT: 37 % (ref 36.0–46.0)
Hemoglobin: 12.6 g/dL (ref 12.0–15.0)
Potassium: 3.6 mmol/L (ref 3.5–5.1)
Sodium: 138 mmol/L (ref 135–145)
TCO2: 28 mmol/L (ref 22–32)

## 2021-10-18 SURGERY — ABDOMINAL AORTOGRAM W/LOWER EXTREMITY
Anesthesia: LOCAL

## 2021-10-18 MED ORDER — HYDRALAZINE HCL 20 MG/ML IJ SOLN: 5.0000 mg | INTRAMUSCULAR | Status: DC | PRN

## 2021-10-18 MED ORDER — IODIXANOL 320 MG/ML IV SOLN
INTRAVENOUS | Status: DC | PRN
  Administered 2021-10-18: 112 mL via INTRA_ARTERIAL

## 2021-10-18 MED ORDER — MIDAZOLAM HCL 2 MG/2ML IJ SOLN
INTRAMUSCULAR | Status: DC | PRN
  Administered 2021-10-18: 1 mg via INTRAVENOUS

## 2021-10-18 MED ORDER — LIDOCAINE HCL (PF) 1 % IJ SOLN
INTRAMUSCULAR | Status: DC | PRN
  Administered 2021-10-18: 18 mL via INTRADERMAL

## 2021-10-18 MED ORDER — SODIUM CHLORIDE 0.9 % IV SOLN: INTRAVENOUS | Status: DC

## 2021-10-18 MED ORDER — SODIUM CHLORIDE 0.9% FLUSH: 3.0000 mL | Freq: Two times a day (BID) | INTRAVENOUS | Status: DC

## 2021-10-18 MED ORDER — HEPARIN (PORCINE) IN NACL 1000-0.9 UT/500ML-% IV SOLN
INTRAVENOUS | Status: AC
  Filled 2021-10-18: qty 1000

## 2021-10-18 MED ORDER — FENTANYL CITRATE (PF) 100 MCG/2ML IJ SOLN
INTRAMUSCULAR | Status: AC
  Filled 2021-10-18: qty 2

## 2021-10-18 MED ORDER — HEPARIN (PORCINE) IN NACL 1000-0.9 UT/500ML-% IV SOLN
INTRAVENOUS | Status: DC | PRN
  Administered 2021-10-18 (×2): 500 mL

## 2021-10-18 MED ORDER — ASPIRIN 81 MG PO TBEC: 81.0000 mg | DELAYED_RELEASE_TABLET | Freq: Every day | ORAL | Status: DC

## 2021-10-18 MED ORDER — MIDAZOLAM HCL 2 MG/2ML IJ SOLN
INTRAMUSCULAR | Status: AC
  Filled 2021-10-18: qty 2

## 2021-10-18 MED ORDER — LABETALOL HCL 5 MG/ML IV SOLN: 10.0000 mg | INTRAVENOUS | Status: DC | PRN

## 2021-10-18 MED ORDER — ACETAMINOPHEN 325 MG PO TABS: 650.0000 mg | ORAL_TABLET | ORAL | Status: DC | PRN

## 2021-10-18 MED ORDER — SODIUM CHLORIDE 0.9% FLUSH: 3.0000 mL | INTRAVENOUS | Status: DC | PRN

## 2021-10-18 MED ORDER — FENTANYL CITRATE (PF) 100 MCG/2ML IJ SOLN
INTRAMUSCULAR | Status: DC | PRN
  Administered 2021-10-18: 50 ug via INTRAVENOUS

## 2021-10-18 MED ORDER — ONDANSETRON HCL 4 MG/2ML IJ SOLN: 4.0000 mg | Freq: Four times a day (QID) | INTRAMUSCULAR | Status: DC | PRN

## 2021-10-18 MED ORDER — ASPIRIN 81 MG PO TBEC: 81.0000 mg | DELAYED_RELEASE_TABLET | Freq: Every day | ORAL | 2 refills | Status: AC

## 2021-10-18 MED ORDER — SODIUM CHLORIDE 0.9 % IV SOLN: 250.0000 mL | INTRAVENOUS | Status: DC | PRN

## 2021-10-18 MED ORDER — LIDOCAINE HCL (PF) 1 % IJ SOLN
INTRAMUSCULAR | Status: AC
Start: 2021-10-18 — End: ?
  Filled 2021-10-18: qty 30

## 2021-10-18 MED ORDER — SODIUM CHLORIDE 0.9 % WEIGHT BASED INFUSION: 1.0000 mL/kg/h | INTRAVENOUS | Status: DC

## 2021-10-18 SURGICAL SUPPLY — 13 items
CATH NAVICROSS ANG 65CM (CATHETERS) IMPLANT
CATH OMNI FLUSH 5F 65CM (CATHETERS) ×1 IMPLANT
CATHETER NAVICROSS ANG 65CM (CATHETERS) ×2
DEVICE CLOSURE MYNXGRIP 5F (Vascular Products) ×1 IMPLANT
GUIDEWIRE ANGLED .035X150CM (WIRE) ×1 IMPLANT
KIT MICROPUNCTURE NIT STIFF (SHEATH) ×1 IMPLANT
KIT PV (KITS) ×2 IMPLANT
SHEATH PINNACLE 5F 10CM (SHEATH) ×1 IMPLANT
SHEATH PROBE COVER 6X72 (BAG) ×1 IMPLANT
SYR MEDRAD MARK V 150ML (SYRINGE) ×1 IMPLANT
TRANSDUCER W/STOPCOCK (MISCELLANEOUS) ×2 IMPLANT
TRAY PV CATH (CUSTOM PROCEDURE TRAY) ×2 IMPLANT
WIRE BENTSON .035X145CM (WIRE) ×1 IMPLANT

## 2021-10-18 NOTE — Interval H&P Note (Signed)
History and Physical Interval Note:  10/18/2021 9:15 AM  Katie Hawkins  has presented today for surgery, with the diagnosis of pad w/ grangren.  The various methods of treatment have been discussed with the patient and family. After consideration of risks, benefits and other options for treatment, the patient has consented to  Procedure(s): ABDOMINAL AORTOGRAM W/LOWER EXTREMITY (N/A) as a surgical intervention.  The patient's history has been reviewed, patient examined, no change in status, stable for surgery.  I have reviewed the patient's chart and labs.  Questions were answered to the patient's satisfaction.     Cherre Robins

## 2021-10-18 NOTE — Progress Notes (Signed)
Bilateral lower extremity and upper extremity vein mapping completed. Refer to "CV Proc" under chart review to view preliminary results.  10/18/2021 11:42 AM Kelby Aline., MHA, RVT, RDCS, RDMS

## 2021-10-18 NOTE — Discharge Instructions (Addendum)
Drink plenty of fluids for 48 hours  Femoral Site Care This sheet gives you information about how to care for yourself after your procedure. Your health care provider may also give you more specific instructions. If you have problems or questions, contact your health care provider. What can I expect after the procedure?  After the procedure, it is common to have: Bruising that usually fades within 1-2 weeks. Tenderness at the site. Follow these instructions at home: Wound care Follow instructions from your health care provider about how to take care of your insertion site. Make sure you: Wash your hands with soap and water before you change your bandage (dressing). If soap and water are not available, use hand sanitizer. Remove your dressing as told by your health care provider. 24 hours Do not take baths, swim, or use a hot tub until your health care provider approves. You may shower 24-48 hours after the procedure or as told by your health care provider. Gently wash the site with plain soap and water. Pat the area dry with a clean towel. Do not rub the site. This may cause bleeding. Do not apply powder or lotion to the site. Keep the site clean and dry. Check your femoral site every day for signs of infection. Check for: Redness, swelling, or pain. Fluid or blood. Warmth. Pus or a bad smell. Activity For the first 2-3 days after your procedure, or as long as directed: Avoid climbing stairs as much as possible. Do not squat. Do not lift anything that is heavier than 10 lb (4.5 kg), or the limit that you are told, until your health care provider says that it is safe. For 5 days Rest as directed. Avoid sitting for a long time without moving. Get up to take short walks every 1-2 hours. Do not drive for 24 hours if you were given a medicine to help you relax (sedative). General instructions Take over-the-counter and prescription medicines only as told by your health care  provider. Keep all follow-up visits as told by your health care provider. This is important. Contact a health care provider if you have: A fever or chills. You have redness, swelling, or pain around your insertion site. Get help right away if: The catheter insertion area swells very fast. You pass out. You suddenly start to sweat or your skin gets clammy. The catheter insertion area is bleeding, and the bleeding does not stop when you hold steady pressure on the area. The area near or just beyond the catheter insertion site becomes pale, cool, tingly, or numb. These symptoms may represent a serious problem that is an emergency. Do not wait to see if the symptoms will go away. Get medical help right away. Call your local emergency services (911 in the [place] Do not drive yourself to the hospital. Summary After the procedure, it is common to have bruising that usually fades within 1-2 weeks. Check your femoral site every day for signs of infection. Do not lift anything that is heavier than 10 lb (4.5 kg), or the limit that you are told, until your health care provider says that it is safe. This information is not intended to replace advice given to you by your health care provider. Make sure you discuss any questions you have with your health care provider. Document Revised: 05/25/2017 Document Reviewed: 05/25/2017 Elsevier Patient Education  2020 [employer].

## 2021-10-22 ENCOUNTER — Inpatient Hospital Stay (HOSPITAL_COMMUNITY): Payer: Medicare HMO | Admitting: Anesthesiology

## 2021-10-22 ENCOUNTER — Encounter (HOSPITAL_COMMUNITY): Payer: Self-pay | Admitting: Surgery

## 2021-10-22 ENCOUNTER — Other Ambulatory Visit: Payer: Self-pay

## 2021-10-22 DIAGNOSIS — I70262 Atherosclerosis of native arteries of extremities with gangrene, left leg: Secondary | ICD-10-CM

## 2021-10-22 NOTE — Progress Notes (Signed)
Katie Hawkins denies chest pain or shortness of breath.  Patient denies having any s/s of Covid in her household.  Patient denies any known exposure to Covid.   Dr. Alben Spittle is Katie Hawkins's PCP.  Katie Hawkins has type II diabetes, patient reports that CBG's run 170 ish in am, in the evening it runs around 100-114. Patient reports that she is in a Research study, she is taking an injection 1 time a week, she takes it on Tuesday. Prior to starting the study Katie Hawkins reports that  A1C was 8.4. I instructed Katie Hawkins to not take Glimepride tonight and to not take any medications for diabetes in am.I instructed patient to check CBG after awaking and every 2 hours until arrival  to the hospital. I Instructed patient if CBG is less than 70 to take 4 Glucose Tablets or 1 tube of Glucose Gel or 1/2 cup of a clear juice. Recheck CBG in 15 minutes if CBG is not over 70 call, pre- op desk at [phone] for further instructions.

## 2021-10-23 ENCOUNTER — Ambulatory Visit (HOSPITAL_COMMUNITY)
Admission: RE | Admit: 2021-10-23 | Discharge: 2021-10-23 | Disposition: A | Discharge status: Home / Self Care | Payer: Medicare HMO | Attending: Surgery | Admitting: Surgery

## 2021-10-23 ENCOUNTER — Encounter (HOSPITAL_COMMUNITY)
Admission: RE | Disposition: A | Discharge status: Home / Self Care | Payer: Self-pay | Source: Home / Self Care | Attending: Surgery

## 2021-10-23 ENCOUNTER — Other Ambulatory Visit: Payer: Self-pay | Admitting: Surgery

## 2021-10-23 ENCOUNTER — Encounter (HOSPITAL_COMMUNITY): Payer: Self-pay | Admitting: Surgery

## 2021-10-23 DIAGNOSIS — Z539 Procedure and treatment not carried out, unspecified reason: Secondary | ICD-10-CM | POA: Insufficient documentation

## 2021-10-23 DIAGNOSIS — N390 Urinary tract infection, site not specified: Secondary | ICD-10-CM | POA: Diagnosis not present

## 2021-10-23 DIAGNOSIS — I10 Essential (primary) hypertension: Secondary | ICD-10-CM | POA: Diagnosis not present

## 2021-10-23 DIAGNOSIS — I70262 Atherosclerosis of native arteries of extremities with gangrene, left leg: Secondary | ICD-10-CM | POA: Insufficient documentation

## 2021-10-23 DIAGNOSIS — E119 Type 2 diabetes mellitus without complications: Secondary | ICD-10-CM | POA: Insufficient documentation

## 2021-10-23 HISTORY — DX: Peripheral vascular disease, unspecified: I73.9

## 2021-10-23 HISTORY — DX: Personal history of other medical treatment: Z92.89

## 2021-10-23 LAB — COMPREHENSIVE METABOLIC PANEL
ALT: 13 U/L (ref 0–44)
AST: 23 U/L (ref 15–41)
Albumin: 3.9 g/dL (ref 3.5–5.0)
Alkaline Phosphatase: 60 U/L (ref 38–126)
Anion gap: 13 (ref 5–15)
BUN: 11 mg/dL (ref 8–23)
CO2: 20 mmol/L — ABNORMAL LOW (ref 22–32)
Calcium: 9.4 mg/dL (ref 8.9–10.3)
Chloride: 102 mmol/L (ref 98–111)
Creatinine, Ser: 0.65 mg/dL (ref 0.44–1.00)
GFR, Estimated: >60 mL/min (ref >60)
Glucose, Bld: 213 mg/dL — ABNORMAL HIGH (ref 70–99)
Potassium: 4.2 mmol/L (ref 3.5–5.1)
Sodium: 135 mmol/L (ref 135–145)
Total Bilirubin: 0.8 mg/dL (ref 0.3–1.2)
Total Protein: 7.4 g/dL (ref 6.5–8.1)

## 2021-10-23 LAB — URINALYSIS, ROUTINE W REFLEX MICROSCOPIC
Bilirubin Urine: NEGATIVE
Glucose, UA: NEGATIVE mg/dL
Hgb urine dipstick: NEGATIVE
Ketones, ur: NEGATIVE mg/dL
Nitrite: POSITIVE — AB
Protein, ur: 100 mg/dL — AB
Specific Gravity, Urine: 1.017 (ref 1.005–1.030)
WBC, UA: >50 WBC/hpf — ABNORMAL HIGH (ref 0–5)
pH: 5 (ref 5.0–8.0)

## 2021-10-23 LAB — ABO/RH: ABO/RH(D): B NEG

## 2021-10-23 LAB — CBC
HCT: 41.7 % (ref 36.0–46.0)
Hemoglobin: 13.8 g/dL (ref 12.0–15.0)
MCH: 30.1 pg (ref 26.0–34.0)
MCHC: 33.1 g/dL (ref 30.0–36.0)
MCV: 90.8 fL (ref 80.0–100.0)
Platelets: 360 10*3/uL (ref 150–400)
RBC: 4.59 MIL/uL (ref 3.87–5.11)
RDW: 13.3 % (ref 11.5–15.5)
WBC: 11 10*3/uL — ABNORMAL HIGH (ref 4.0–10.5)
nRBC: 0 % (ref 0.0–0.2)

## 2021-10-23 LAB — APTT: aPTT: 29 seconds (ref 24–36)

## 2021-10-23 LAB — PROTIME-INR
INR: 1 (ref 0.8–1.2)
Prothrombin Time: 12.9 seconds (ref 11.4–15.2)

## 2021-10-23 LAB — GLUCOSE, CAPILLARY
Glucose-Capillary: 220 mg/dL — ABNORMAL HIGH (ref 70–99)
Glucose-Capillary: 178 mg/dL — ABNORMAL HIGH (ref 70–99)

## 2021-10-23 LAB — TYPE AND SCREEN
ABO/RH(D): B NEG
Antibody Screen: NEGATIVE

## 2021-10-23 LAB — SURGICAL PCR SCREEN
MRSA, PCR: POSITIVE — AB
Staphylococcus aureus: POSITIVE — AB

## 2021-10-23 SURGERY — CREATION, BYPASS, ARTERIAL, FEMORAL TO PERONEAL, USING GRAFT
Anesthesia: General | Laterality: Left

## 2021-10-23 MED ORDER — FENTANYL CITRATE (PF) 250 MCG/5ML IJ SOLN
INTRAMUSCULAR | Status: AC
  Filled 2021-10-23: qty 5

## 2021-10-23 MED ORDER — ACETAMINOPHEN 500 MG PO TABS
1000.0000 mg | ORAL_TABLET | Freq: Once | ORAL | Status: AC
  Administered 2021-10-23: 1000 mg via ORAL
  Filled 2021-10-23: qty 2

## 2021-10-23 MED ORDER — ROCURONIUM BROMIDE 10 MG/ML (PF) SYRINGE
PREFILLED_SYRINGE | INTRAVENOUS | Status: AC
  Filled 2021-10-23: qty 10

## 2021-10-23 MED ORDER — CHLORHEXIDINE GLUCONATE CLOTH 2 % EX PADS: 6.0000 | MEDICATED_PAD | Freq: Once | CUTANEOUS | Status: DC

## 2021-10-23 MED ORDER — CHLORHEXIDINE GLUCONATE 0.12 % MT SOLN
OROMUCOSAL | Status: AC
  Administered 2021-10-23: 15 mL
  Filled 2021-10-23: qty 15

## 2021-10-23 MED ORDER — ONDANSETRON HCL 4 MG/2ML IJ SOLN
INTRAMUSCULAR | Status: AC
  Filled 2021-10-23: qty 2

## 2021-10-23 MED ORDER — CEFAZOLIN SODIUM-DEXTROSE 2-4 GM/100ML-% IV SOLN
2.0000 g | INTRAVENOUS | Status: DC
  Filled 2021-10-23: qty 100

## 2021-10-23 MED ORDER — SODIUM CHLORIDE 0.9 % IV SOLN: INTRAVENOUS | Status: DC

## 2021-10-23 MED ORDER — LIDOCAINE 2% (20 MG/ML) 5 ML SYRINGE
INTRAMUSCULAR | Status: AC
  Filled 2021-10-23: qty 5

## 2021-10-23 MED ORDER — SULFAMETHOXAZOLE-TRIMETHOPRIM 800-160 MG PO TABS: 1.0000 | ORAL_TABLET | Freq: Two times a day (BID) | ORAL | 0 refills | Status: DC

## 2021-10-23 MED ORDER — INSULIN ASPART 100 UNIT/ML IJ SOLN
0.0000 [IU] | INTRAMUSCULAR | Status: DC | PRN
  Administered 2021-10-23: 3 [IU] via SUBCUTANEOUS
  Filled 2021-10-23: qty 1

## 2021-10-23 MED ORDER — HYDROCODONE-ACETAMINOPHEN 5-325 MG PO TABS: 1.0000 | ORAL_TABLET | ORAL | 0 refills | Status: DC | PRN

## 2021-10-23 MED ORDER — MIDAZOLAM HCL 2 MG/2ML IJ SOLN
INTRAMUSCULAR | Status: AC
  Filled 2021-10-23: qty 2

## 2021-10-23 MED ORDER — DEXAMETHASONE SODIUM PHOSPHATE 10 MG/ML IJ SOLN
INTRAMUSCULAR | Status: AC
  Filled 2021-10-23: qty 1

## 2021-10-23 MED ORDER — PROPOFOL 10 MG/ML IV BOLUS
INTRAVENOUS | Status: AC
  Filled 2021-10-23: qty 20

## 2021-10-23 NOTE — Progress Notes (Signed)
Bypass cancelled due to UTI and high liklihood for PTFE graft.  Will treat with Bactrim and reschedule for next week.  Also called in hydrocodone 20 tabs for rest pain   Wells Kayton Dunaj

## 2021-10-23 NOTE — Anesthesia Preprocedure Evaluation (Signed)
Anesthesia Evaluation  Patient identified by MRN, date of birth, ID band Patient awake    Reviewed: Allergy & Precautions, NPO status , Patient's Chart, lab work & pertinent test results  History of Anesthesia Complications Negative for: history of anesthetic complications  Airway Mallampati: II  TM Distance: >3 FB Neck ROM: Full    Dental  (+) Teeth Intact, Dental Advisory Given   Pulmonary neg pulmonary ROS,    Pulmonary exam normal        Cardiovascular hypertension, Pt. on medications + Peripheral Vascular Disease  Normal cardiovascular exam     Neuro/Psych negative neurological ROS     GI/Hepatic negative GI ROS, Neg liver ROS,   Endo/Other  diabetes, Oral Hypoglycemic Agents  Renal/GU negative Renal ROS  negative genitourinary   Musculoskeletal negative musculoskeletal ROS (+)   Abdominal   Peds  Hematology H/o Hodgkin's lymphoma   Anesthesia Other Findings   Reproductive/Obstetrics                            Anesthesia Physical Anesthesia Plan  ASA: 3  Anesthesia Plan: General   Post-op Pain Management: Tylenol PO (pre-op)*   Induction: Intravenous  PONV Risk Score and Plan: 3 and Ondansetron, Dexamethasone, Treatment may vary due to age or medical condition and Midazolam  Airway Management Planned: Oral ETT  Additional Equipment: Arterial line  Intra-op Plan:   Post-operative Plan: Extubation in OR  Informed Consent: I have reviewed the patients History and Physical, chart, labs and discussed the procedure including the risks, benefits and alternatives for the proposed anesthesia with the patient or authorized representative who has indicated his/her understanding and acceptance.     Dental advisory given  Plan Discussed with:   Anesthesia Plan Comments:         Anesthesia Quick Evaluation

## 2021-10-23 NOTE — Anesthesia Procedure Notes (Addendum)
Arterial Line Insertion Start/End5/31/2023 10:50 AM, 10/23/2021 11:05 AM Performed by: Lidia Collum, MD, anesthesiologist  Patient location: Pre-op. Preanesthetic checklist: patient identified, IV checked, site marked, risks and benefits discussed, surgical consent, monitors and equipment checked, pre-op evaluation, timeout performed and anesthesia consent Lidocaine 1% used for infiltration Left, radial was placed Catheter size: 20 G Hand hygiene performed  and maximum sterile barriers used   Attempts: 1 Procedure performed using ultrasound guided technique. Ultrasound Notes:anatomy identified, needle tip was noted to be adjacent to the nerve/plexus identified and no ultrasound evidence of intravascular and/or intraneural injection Following insertion, dressing applied and Biopatch. Post procedure assessment: normal and unchanged  Post procedure complications: second provider assisted. Patient tolerated the procedure well with no immediate complications. Additional procedure comments: CRNA attempted x 2, MDA Tonja Jezewski called, [place] used. Successful placement.

## 2021-10-23 NOTE — Progress Notes (Signed)
Notified Dr. Trula Slade of pt's UA results-positive nitrites,large leukocytes,many bacteria. He wiil see pt before surgery.

## 2021-10-23 NOTE — Progress Notes (Signed)
Pt's surgery cancelled due to abnormal lab. Pt verbalized understanding of surgery cancellation. A-line d/c'd by CRNA;IV d/c'd  by RN. Pt discharged to home in care of husband.

## 2021-10-25 NOTE — Pre-Procedure Instructions (Signed)
Surgical Instructions    Your procedure is scheduled on Wednesday, June 7th.  Report to [facility] Main Entrance "A" at 12:00 P.M., then check in with the Admitting office.  Call this number if you have problems the morning of surgery:  [phone]   If you have any questions prior to your surgery date call [phone]: Open Monday-Friday 8am-4pm    Remember:  Do not eat after midnight the night before your surgery  You may drink clear liquids until 11:00 AM the morning of your surgery.   Clear liquids allowed are: Water, Non-Citrus Juices (without pulp), Carbonated Beverages, Clear Tea, Black Coffee Only (NO MILK, CREAM OR POWDERED CREAMER of any kind), and Gatorade.    Take these medicines the morning of surgery with A SIP OF WATER  ezetimibe (ZETIA)  metoprolol succinate (TOPROL-XL)  pravastatin (PRAVACHOL) sulfamethoxazole-trimethoprim (BACTRIM DS)   If needed: HYDROcodone-acetaminophen (NORCO/VICODIN)   Follow your surgeon's instructions on when to stop Aspirin.  If no instructions were given by your surgeon then you will need to call the office to get those instructions.     As of today, STOP taking any Aleve, Naproxen, Ibuprofen, Motrin, Advil, Goody's, BC's, all herbal medications, fish oil, and all vitamins.   WHAT DO I DO ABOUT MY DIABETES MEDICATION?   Do not take metFORMIN (GLUCOPHAGE-XR) or glimepiride (AMARYL) .  THE NIGHT BEFORE SURGERY,do not take evening dose of glimepiride (AMARYL)         HOW TO MANAGE YOUR DIABETES BEFORE AND AFTER SURGERY  Why is it important to control my blood sugar before and after surgery? Improving blood sugar levels before and after surgery helps healing and can limit problems. A way of improving blood sugar control is eating a healthy diet by:  Eating less sugar and carbohydrates  Increasing activity/exercise  Talking with your doctor about reaching your blood sugar goals High blood sugars (greater than 180 mg/dL) can  raise your risk of infections and slow your recovery, so you will need to focus on controlling your diabetes during the weeks before surgery. Make sure that the doctor who takes care of your diabetes knows about your planned surgery including the date and location.  How do I manage my blood sugar before surgery? Check your blood sugar at least 4 times a day, starting 2 days before surgery, to make sure that the level is not too high or low.  Check your blood sugar the morning of your surgery when you wake up and every 2 hours until you get to the Short Stay unit.  If your blood sugar is less than 70 mg/dL, you will need to treat for low blood sugar: Do not take insulin. Treat a low blood sugar (less than 70 mg/dL) with  cup of clear juice (cranberry or apple), 4 glucose tablets, OR glucose gel. Recheck blood sugar in 15 minutes after treatment (to make sure it is greater than 70 mg/dL). If your blood sugar is not greater than 70 mg/dL on recheck, call [phone] for further instructions. Report your blood sugar to the short stay nurse when you get to Short Stay.  If you are admitted to the hospital after surgery: Your blood sugar will be checked by the staff and you will probably be given insulin after surgery (instead of oral diabetes medicines) to make sure you have good blood sugar levels. The goal for blood sugar control after surgery is 80-180 mg/dL.  Do NOT Smoke (Tobacco/Vaping) for 24 hours prior to your procedure.  If you use a CPAP at night, you may bring your mask/headgear for your overnight stay.   Contacts, glasses, piercing's, hearing aid's, dentures or partials may not be worn into surgery, please bring cases for these belongings.    For patients admitted to the hospital, discharge time will be determined by your treatment team.   Patients discharged the day of surgery will not be allowed to drive home, and someone needs to stay with them for 24  hours.  SURGICAL WAITING ROOM VISITATION Patients having surgery or a procedure may have two support people in the waiting room. These visitors may be switched out with other visitors if needed. Children under the age of 20 must have an adult accompany them who is not the patient. If the patient needs to stay at the hospital during part of their recovery, the visitor guidelines for inpatient rooms apply.  Please refer to the [facility] website for the visitor guidelines for Inpatients (after your surgery is over and you are in a regular room).    Special instructions:   [place]- Preparing For Surgery  Before surgery, you can play an important role. Because skin is not sterile, your skin needs to be as free of germs as possible. You can reduce the number of germs on your skin by washing with CHG (chlorahexidine gluconate) Soap before surgery.  CHG is an antiseptic cleaner which kills germs and bonds with the skin to continue killing germs even after washing.    Oral Hygiene is also important to reduce your risk of infection.  Remember - BRUSH YOUR TEETH THE MORNING OF SURGERY WITH YOUR REGULAR TOOTHPASTE  Please do not use if you have an allergy to CHG or antibacterial soaps. If your skin becomes reddened/irritated stop using the CHG.  Do not shave (including legs and underarms) for at least 48 hours prior to first CHG shower. It is OK to shave your face.  Please follow these instructions carefully.   Shower the NIGHT BEFORE SURGERY and the MORNING OF SURGERY  If you chose to wash your hair, wash your hair first as usual with your normal shampoo.  After you shampoo, rinse your hair and body thoroughly to remove the shampoo.  Use CHG Soap as you would any other liquid soap. You can apply CHG directly to the skin and wash gently with a scrungie or a clean washcloth.   Apply the CHG Soap to your body ONLY FROM THE NECK DOWN.  Do not use on open wounds or open sores. Avoid contact with  your eyes, ears, mouth and genitals (private parts). Wash Face and genitals (private parts)  with your normal soap.   Wash thoroughly, paying special attention to the area where your surgery will be performed.  Thoroughly rinse your body with warm water from the neck down.  DO NOT shower/wash with your normal soap after using and rinsing off the CHG Soap.  Pat yourself dry with a CLEAN TOWEL.  Wear CLEAN PAJAMAS to bed the night before surgery  Place CLEAN SHEETS on your bed the night before your surgery  DO NOT SLEEP WITH PETS.   Day of Surgery: Take a shower with CHG soap. Do not wear jewelry or makeup Do not wear lotions, powders, perfumes, or deodorant. Do not shave 48 hours prior to surgery.   Do not bring valuables to the hospital.  [facility] is not responsible for any belongings or  valuables. Do not wear nail polish, gel polish, artificial nails, or any other type of covering on natural nails (fingers and toes) If you have artificial nails or gel coating that need to be removed by a nail salon, please have this removed prior to surgery. Artificial nails or gel coating may interfere with anesthesia's ability to adequately monitor your vital signs. Wear Clean/Comfortable clothing the morning of surgery Remember to brush your teeth WITH YOUR REGULAR TOOTHPASTE.   Please read over the following fact sheets that you were given.    If you received a COVID test during your pre-op visit  it is requested that you wear a mask when out in public, stay away from anyone that may not be feeling well and notify your surgeon if you develop symptoms. If you have been in contact with anyone that has tested positive in the last 10 days please notify you surgeon.

## 2021-10-28 ENCOUNTER — Encounter (HOSPITAL_COMMUNITY): Payer: Self-pay

## 2021-10-28 ENCOUNTER — Other Ambulatory Visit: Payer: Self-pay

## 2021-10-28 ENCOUNTER — Encounter (HOSPITAL_COMMUNITY)
Admission: RE | Admit: 2021-10-28 | Discharge: 2021-10-28 | Disposition: A | Discharge status: Home / Self Care | Payer: Medicare HMO | Source: Ambulatory Visit | Attending: Surgery | Admitting: Surgery

## 2021-10-28 VITALS — BP 137/60 | HR 80 | Temp 98.2°F | Resp 17 | Ht 63.0 in | Wt 178.3 lb

## 2021-10-28 DIAGNOSIS — E1152 Type 2 diabetes mellitus with diabetic peripheral angiopathy with gangrene: Secondary | ICD-10-CM | POA: Diagnosis present

## 2021-10-28 DIAGNOSIS — E119 Type 2 diabetes mellitus without complications: Secondary | ICD-10-CM | POA: Insufficient documentation

## 2021-10-28 DIAGNOSIS — Z833 Family history of diabetes mellitus: Secondary | ICD-10-CM | POA: Diagnosis not present

## 2021-10-28 DIAGNOSIS — Z79899 Other long term (current) drug therapy: Secondary | ICD-10-CM | POA: Diagnosis not present

## 2021-10-28 DIAGNOSIS — Z01818 Encounter for other preprocedural examination: Secondary | ICD-10-CM

## 2021-10-28 DIAGNOSIS — F17210 Nicotine dependence, cigarettes, uncomplicated: Secondary | ICD-10-CM | POA: Diagnosis present

## 2021-10-28 DIAGNOSIS — Z01812 Encounter for preprocedural laboratory examination: Secondary | ICD-10-CM | POA: Insufficient documentation

## 2021-10-28 DIAGNOSIS — Z7984 Long term (current) use of oral hypoglycemic drugs: Secondary | ICD-10-CM | POA: Diagnosis not present

## 2021-10-28 DIAGNOSIS — L97429 Non-pressure chronic ulcer of left heel and midfoot with unspecified severity: Secondary | ICD-10-CM | POA: Diagnosis present

## 2021-10-28 DIAGNOSIS — I779 Disorder of arteries and arterioles, unspecified: Secondary | ICD-10-CM | POA: Diagnosis present

## 2021-10-28 DIAGNOSIS — Z806 Family history of leukemia: Secondary | ICD-10-CM | POA: Diagnosis not present

## 2021-10-28 DIAGNOSIS — I96 Gangrene, not elsewhere classified: Secondary | ICD-10-CM | POA: Diagnosis not present

## 2021-10-28 DIAGNOSIS — Z888 Allergy status to other drugs, medicaments and biological substances status: Secondary | ICD-10-CM | POA: Diagnosis not present

## 2021-10-28 DIAGNOSIS — E785 Hyperlipidemia, unspecified: Secondary | ICD-10-CM | POA: Diagnosis not present

## 2021-10-28 DIAGNOSIS — E7800 Pure hypercholesterolemia, unspecified: Secondary | ICD-10-CM | POA: Diagnosis present

## 2021-10-28 DIAGNOSIS — Z801 Family history of malignant neoplasm of trachea, bronchus and lung: Secondary | ICD-10-CM | POA: Diagnosis not present

## 2021-10-28 DIAGNOSIS — Z8571 Personal history of Hodgkin lymphoma: Secondary | ICD-10-CM | POA: Diagnosis not present

## 2021-10-28 DIAGNOSIS — I70262 Atherosclerosis of native arteries of extremities with gangrene, left leg: Secondary | ICD-10-CM | POA: Diagnosis present

## 2021-10-28 DIAGNOSIS — I10 Essential (primary) hypertension: Secondary | ICD-10-CM | POA: Diagnosis present

## 2021-10-28 DIAGNOSIS — E1165 Type 2 diabetes mellitus with hyperglycemia: Secondary | ICD-10-CM | POA: Diagnosis not present

## 2021-10-28 DIAGNOSIS — D649 Anemia, unspecified: Secondary | ICD-10-CM | POA: Diagnosis not present

## 2021-10-28 LAB — HEMOGLOBIN A1C
Hgb A1c MFr Bld: 7.9 % — ABNORMAL HIGH (ref 4.8–5.6)
Mean Plasma Glucose: 180.03 mg/dL

## 2021-10-28 LAB — GLUCOSE, CAPILLARY: Glucose-Capillary: 177 mg/dL — ABNORMAL HIGH (ref 70–99)

## 2021-10-28 NOTE — Pre-Procedure Instructions (Signed)
Surgical Instructions    Your procedure is scheduled on Wednesday, June 7th.  Report to [facility] Main Entrance "A" at 12:00 P.M., then check in with the Admitting office.  Call this number if you have problems the morning of surgery:  [phone]   If you have any questions prior to your surgery date call [phone]: Open Monday-Friday 8am-4pm    Remember:  Do not eat after midnight the night before your surgery  You may drink clear liquids until 11:00 AM the morning of your surgery.   Clear liquids allowed are: Water, Non-Citrus Juices (without pulp), Carbonated Beverages, Clear Tea, Black Coffee Only (NO MILK, CREAM OR POWDERED CREAMER of any kind), and Gatorade.    Take these medicines the morning of surgery with A SIP OF WATER  ezetimibe (ZETIA)  metoprolol succinate (TOPROL-XL)  pravastatin (PRAVACHOL) sulfamethoxazole-trimethoprim (BACTRIM DS)   If needed: HYDROcodone-acetaminophen (NORCO/VICODIN)   Follow your surgeon's instructions on when to stop Aspirin.  If no instructions were given by your surgeon then you will need to call the office to get those instructions.     As of today, STOP taking any Aleve, Naproxen, Ibuprofen, Motrin, Advil, Goody's, BC's, all herbal medications, fish oil, and all vitamins.   WHAT DO I DO ABOUT MY DIABETES MEDICATION?   Do not take metFORMIN (GLUCOPHAGE-XR) or glimepiride (AMARYL) .  THE NIGHT BEFORE SURGERY,do not take evening dose of glimepiride (AMARYL)       HOW TO MANAGE YOUR DIABETES BEFORE AND AFTER SURGERY  Why is it important to control my blood sugar before and after surgery? Improving blood sugar levels before and after surgery helps healing and can limit problems. A way of improving blood sugar control is eating a healthy diet by:  Eating less sugar and carbohydrates  Increasing activity/exercise  Talking with your doctor about reaching your blood sugar goals High blood sugars (greater than 180 mg/dL) can raise  your risk of infections and slow your recovery, so you will need to focus on controlling your diabetes during the weeks before surgery. Make sure that the doctor who takes care of your diabetes knows about your planned surgery including the date and location.  How do I manage my blood sugar before surgery? Check your blood sugar at least 4 times a day, starting 2 days before surgery, to make sure that the level is not too high or low.  Check your blood sugar the morning of your surgery when you wake up and every 2 hours until you get to the Short Stay unit.  If your blood sugar is less than 70 mg/dL, you will need to treat for low blood sugar: Do not take insulin. Treat a low blood sugar (less than 70 mg/dL) with  cup of clear juice (cranberry or apple), 4 glucose tablets, OR glucose gel. Recheck blood sugar in 15 minutes after treatment (to make sure it is greater than 70 mg/dL). If your blood sugar is not greater than 70 mg/dL on recheck, call [phone] for further instructions. Report your blood sugar to the short stay nurse when you get to Short Stay.  If you are admitted to the hospital after surgery: Your blood sugar will be checked by the staff and you will probably be given insulin after surgery (instead of oral diabetes medicines) to make sure you have good blood sugar levels. The goal for blood sugar control after surgery is 80-180 mg/dL.  Do NOT Smoke (Tobacco/Vaping) for 24 hours prior to your procedure.  If you use a CPAP at night, you may bring your mask/headgear for your overnight stay.   Contacts, glasses, piercing's, hearing aid's, dentures or partials may not be worn into surgery, please bring cases for these belongings.    For patients admitted to the hospital, discharge time will be determined by your treatment team.   Patients discharged the day of surgery will not be allowed to drive home, and someone needs to stay with them for 24  hours.  SURGICAL WAITING ROOM VISITATION Patients having surgery or a procedure may have two support people in the waiting room. These visitors may be switched out with other visitors if needed. Children under the age of 56 must have an adult accompany them who is not the patient. If the patient needs to stay at the hospital during part of their recovery, the visitor guidelines for inpatient rooms apply.  Please refer to the [facility] website for the visitor guidelines for Inpatients (after your surgery is over and you are in a regular room).    Special instructions:   [place]- Preparing For Surgery  Before surgery, you can play an important role. Because skin is not sterile, your skin needs to be as free of germs as possible. You can reduce the number of germs on your skin by washing with CHG (chlorahexidine gluconate) Soap before surgery.  CHG is an antiseptic cleaner which kills germs and bonds with the skin to continue killing germs even after washing.    Oral Hygiene is also important to reduce your risk of infection.  Remember - BRUSH YOUR TEETH THE MORNING OF SURGERY WITH YOUR REGULAR TOOTHPASTE  Please do not use if you have an allergy to CHG or antibacterial soaps. If your skin becomes reddened/irritated stop using the CHG.  Do not shave (including legs and underarms) for at least 48 hours prior to first CHG shower. It is OK to shave your face.  Please follow these instructions carefully.   Shower the NIGHT BEFORE SURGERY and the MORNING OF SURGERY  If you chose to wash your hair, wash your hair first as usual with your normal shampoo.  After you shampoo, rinse your hair and body thoroughly to remove the shampoo.  Use CHG Soap as you would any other liquid soap. You can apply CHG directly to the skin and wash gently with a scrungie or a clean washcloth.   Apply the CHG Soap to your body ONLY FROM THE NECK DOWN.  Do not use on open wounds or open sores. Avoid contact with  your eyes, ears, mouth and genitals (private parts). Wash Face and genitals (private parts)  with your normal soap.   Wash thoroughly, paying special attention to the area where your surgery will be performed.  Thoroughly rinse your body with warm water from the neck down.  DO NOT shower/wash with your normal soap after using and rinsing off the CHG Soap.  Pat yourself dry with a CLEAN TOWEL.  Wear CLEAN PAJAMAS to bed the night before surgery  Place CLEAN SHEETS on your bed the night before your surgery  DO NOT SLEEP WITH PETS.   Day of Surgery: Take a shower with CHG soap. Do not wear jewelry or makeup Do not wear lotions, powders, perfumes, or deodorant. Do not shave 48 hours prior to surgery.   Do not bring valuables to the hospital.  [facility] is not responsible for any belongings or  valuables. Do not wear nail polish, gel polish, artificial nails, or any other type of covering on natural nails (fingers and toes) If you have artificial nails or gel coating that need to be removed by a nail salon, please have this removed prior to surgery. Artificial nails or gel coating may interfere with anesthesia's ability to adequately monitor your vital signs. Wear Clean/Comfortable clothing the morning of surgery Remember to brush your teeth WITH YOUR REGULAR TOOTHPASTE.   Please read over the following fact sheets that you were given.

## 2021-10-28 NOTE — Progress Notes (Signed)
PCP - Carol Ada Cardiologist - denies  Chest x-ray - n/a EKG - 10/18/21  DM - Type 2 Fasting Blood Sugar - 170-190  Aspirin Instructions:follow your surgeon's instructions on when to stop    Anesthesia review: n/a  Patient denies shortness of breath, fever, cough and chest pain at PAT appointment   All instructions explained to the patient, with a verbal understanding of the material. Patient agrees to go over the instructions while at home for a better understanding. Patient also instructed to self quarantine after being tested for COVID-19. The opportunity to ask questions was provided.

## 2021-10-30 ENCOUNTER — Encounter (HOSPITAL_COMMUNITY)
Admission: RE | Disposition: A | Discharge status: Home / Self Care | Payer: Self-pay | Source: Home / Self Care | Attending: Surgery

## 2021-10-30 ENCOUNTER — Inpatient Hospital Stay (HOSPITAL_COMMUNITY)
Admission: RE | Admit: 2021-10-30 | Discharge: 2021-11-02 | DRG: 253 | Disposition: A | Discharge status: Home / Self Care | Payer: Medicare HMO | Attending: Surgery | Admitting: Surgery

## 2021-10-30 ENCOUNTER — Other Ambulatory Visit: Payer: Self-pay

## 2021-10-30 ENCOUNTER — Inpatient Hospital Stay (HOSPITAL_COMMUNITY): Payer: Medicare HMO | Admitting: Anesthesiology

## 2021-10-30 DIAGNOSIS — L97429 Non-pressure chronic ulcer of left heel and midfoot with unspecified severity: Secondary | ICD-10-CM | POA: Diagnosis present

## 2021-10-30 DIAGNOSIS — Z79899 Other long term (current) drug therapy: Secondary | ICD-10-CM | POA: Diagnosis not present

## 2021-10-30 DIAGNOSIS — Z806 Family history of leukemia: Secondary | ICD-10-CM

## 2021-10-30 DIAGNOSIS — I10 Essential (primary) hypertension: Secondary | ICD-10-CM

## 2021-10-30 DIAGNOSIS — E7800 Pure hypercholesterolemia, unspecified: Secondary | ICD-10-CM | POA: Diagnosis present

## 2021-10-30 DIAGNOSIS — Z801 Family history of malignant neoplasm of trachea, bronchus and lung: Secondary | ICD-10-CM | POA: Diagnosis not present

## 2021-10-30 DIAGNOSIS — Z7984 Long term (current) use of oral hypoglycemic drugs: Secondary | ICD-10-CM

## 2021-10-30 DIAGNOSIS — Z8571 Personal history of Hodgkin lymphoma: Secondary | ICD-10-CM | POA: Diagnosis not present

## 2021-10-30 DIAGNOSIS — I70262 Atherosclerosis of native arteries of extremities with gangrene, left leg: Secondary | ICD-10-CM

## 2021-10-30 DIAGNOSIS — I779 Disorder of arteries and arterioles, unspecified: Secondary | ICD-10-CM | POA: Diagnosis present

## 2021-10-30 DIAGNOSIS — F17210 Nicotine dependence, cigarettes, uncomplicated: Secondary | ICD-10-CM | POA: Diagnosis present

## 2021-10-30 DIAGNOSIS — I96 Gangrene, not elsewhere classified: Secondary | ICD-10-CM | POA: Diagnosis not present

## 2021-10-30 DIAGNOSIS — E1152 Type 2 diabetes mellitus with diabetic peripheral angiopathy with gangrene: Principal | ICD-10-CM | POA: Diagnosis present

## 2021-10-30 DIAGNOSIS — Z833 Family history of diabetes mellitus: Secondary | ICD-10-CM

## 2021-10-30 DIAGNOSIS — D649 Anemia, unspecified: Secondary | ICD-10-CM | POA: Diagnosis not present

## 2021-10-30 DIAGNOSIS — Z01818 Encounter for other preprocedural examination: Secondary | ICD-10-CM

## 2021-10-30 DIAGNOSIS — Z888 Allergy status to other drugs, medicaments and biological substances status: Secondary | ICD-10-CM | POA: Diagnosis not present

## 2021-10-30 DIAGNOSIS — E785 Hyperlipidemia, unspecified: Secondary | ICD-10-CM | POA: Diagnosis not present

## 2021-10-30 HISTORY — PX: BYPASS GRAFT POPLITEAL TO TIBIAL: SHX5764

## 2021-10-30 HISTORY — PX: APPLICATION OF WOUND VAC: SHX5189

## 2021-10-30 HISTORY — PX: VEIN HARVEST: SHX6363

## 2021-10-30 LAB — TYPE AND SCREEN
ABO/RH(D): B NEG
Antibody Screen: NEGATIVE

## 2021-10-30 LAB — URINALYSIS, ROUTINE W REFLEX MICROSCOPIC
Bilirubin Urine: NEGATIVE
Glucose, UA: NEGATIVE mg/dL
Hgb urine dipstick: NEGATIVE
Ketones, ur: 5 mg/dL — AB
Leukocytes,Ua: NEGATIVE
Nitrite: NEGATIVE
Protein, ur: NEGATIVE mg/dL
Specific Gravity, Urine: 1.005 (ref 1.005–1.030)
pH: 6 (ref 5.0–8.0)

## 2021-10-30 LAB — GLUCOSE, CAPILLARY
Glucose-Capillary: 179 mg/dL — ABNORMAL HIGH (ref 70–99)
Glucose-Capillary: 141 mg/dL — ABNORMAL HIGH (ref 70–99)
Glucose-Capillary: 141 mg/dL — ABNORMAL HIGH (ref 70–99)
Glucose-Capillary: 176 mg/dL — ABNORMAL HIGH (ref 70–99)

## 2021-10-30 SURGERY — CREATION, BYPASS, ARTERIAL, POPLITEAL TO TIBIAL, USING GRAFT
Anesthesia: General | Site: Leg Upper | Laterality: Left

## 2021-10-30 MED ORDER — HEPARIN 6000 UNIT IRRIGATION SOLUTION
Status: DC | PRN
  Administered 2021-10-30: 1

## 2021-10-30 MED ORDER — CHLORHEXIDINE GLUCONATE 0.12 % MT SOLN
15.0000 mL | Freq: Once | OROMUCOSAL | Status: AC
  Administered 2021-10-30: 15 mL via OROMUCOSAL
  Filled 2021-10-30: qty 15

## 2021-10-30 MED ORDER — PHENYLEPHRINE HCL-NACL 20-0.9 MG/250ML-% IV SOLN
INTRAVENOUS | Status: DC | PRN
  Administered 2021-10-30: 40 ug/min via INTRAVENOUS

## 2021-10-30 MED ORDER — ACETAMINOPHEN 325 MG PO TABS: 325.0000 mg | ORAL_TABLET | ORAL | Status: DC | PRN

## 2021-10-30 MED ORDER — MIDAZOLAM HCL 2 MG/2ML IJ SOLN: 0.5000 mg | Freq: Once | INTRAMUSCULAR | Status: DC | PRN

## 2021-10-30 MED ORDER — PROTAMINE SULFATE 10 MG/ML IV SOLN
INTRAVENOUS | Status: AC
Start: 2021-10-30 — End: ?
  Filled 2021-10-30: qty 10

## 2021-10-30 MED ORDER — LACTATED RINGERS IV SOLN: INTRAVENOUS | Status: DC

## 2021-10-30 MED ORDER — PHENYLEPHRINE 80 MCG/ML (10ML) SYRINGE FOR IV PUSH (FOR BLOOD PRESSURE SUPPORT)
PREFILLED_SYRINGE | INTRAVENOUS | Status: DC | PRN
  Administered 2021-10-30 (×3): 160 ug via INTRAVENOUS

## 2021-10-30 MED ORDER — INSULIN ASPART 100 UNIT/ML IJ SOLN: 0.0000 [IU] | INTRAMUSCULAR | Status: DC | PRN

## 2021-10-30 MED ORDER — HYDROMORPHONE HCL 1 MG/ML IJ SOLN
INTRAMUSCULAR | Status: AC
  Filled 2021-10-30: qty 0.5

## 2021-10-30 MED ORDER — LISINOPRIL 10 MG PO TABS
10.0000 mg | ORAL_TABLET | Freq: Every day | ORAL | Status: DC
  Administered 2021-10-31 – 2021-11-02 (×3): 10 mg via ORAL
  Filled 2021-10-30 (×3): qty 1

## 2021-10-30 MED ORDER — METOPROLOL SUCCINATE ER 25 MG PO TB24
25.0000 mg | ORAL_TABLET | Freq: Every day | ORAL | Status: DC
  Administered 2021-10-31 – 2021-11-02 (×3): 25 mg via ORAL
  Filled 2021-10-30 (×3): qty 1

## 2021-10-30 MED ORDER — ROCURONIUM BROMIDE 10 MG/ML (PF) SYRINGE
PREFILLED_SYRINGE | INTRAVENOUS | Status: AC
Start: 2021-10-30 — End: ?
  Filled 2021-10-30: qty 20

## 2021-10-30 MED ORDER — ONDANSETRON HCL 4 MG/2ML IJ SOLN
INTRAMUSCULAR | Status: DC | PRN
  Administered 2021-10-30: 4 mg via INTRAVENOUS

## 2021-10-30 MED ORDER — CHLORHEXIDINE GLUCONATE CLOTH 2 % EX PADS: 6.0000 | MEDICATED_PAD | Freq: Once | CUTANEOUS | Status: DC

## 2021-10-30 MED ORDER — ROCURONIUM BROMIDE 10 MG/ML (PF) SYRINGE
PREFILLED_SYRINGE | INTRAVENOUS | Status: DC | PRN
  Administered 2021-10-30: 30 mg via INTRAVENOUS
  Administered 2021-10-30: 40 mg via INTRAVENOUS
  Administered 2021-10-30: 70 mg via INTRAVENOUS
  Administered 2021-10-30: 30 mg via INTRAVENOUS

## 2021-10-30 MED ORDER — EZETIMIBE 10 MG PO TABS
10.0000 mg | ORAL_TABLET | Freq: Every day | ORAL | Status: DC
  Administered 2021-10-31 – 2021-11-02 (×3): 10 mg via ORAL
  Filled 2021-10-30 (×3): qty 1

## 2021-10-30 MED ORDER — BISACODYL 5 MG PO TBEC: 5.0000 mg | DELAYED_RELEASE_TABLET | Freq: Every day | ORAL | Status: DC | PRN

## 2021-10-30 MED ORDER — SODIUM CHLORIDE 0.9 % IV SOLN: INTRAVENOUS | Status: DC

## 2021-10-30 MED ORDER — INSULIN ASPART 100 UNIT/ML IJ SOLN
0.0000 [IU] | Freq: Three times a day (TID) | INTRAMUSCULAR | Status: DC
  Administered 2021-10-31: 3 [IU] via SUBCUTANEOUS
  Administered 2021-10-31: 5 [IU] via SUBCUTANEOUS
  Administered 2021-10-31 – 2021-11-02 (×4): 3 [IU] via SUBCUTANEOUS

## 2021-10-30 MED ORDER — PHENYLEPHRINE 80 MCG/ML (10ML) SYRINGE FOR IV PUSH (FOR BLOOD PRESSURE SUPPORT)
PREFILLED_SYRINGE | INTRAVENOUS | Status: AC
  Filled 2021-10-30: qty 20

## 2021-10-30 MED ORDER — HEPARIN SODIUM (PORCINE) 5000 UNIT/ML IJ SOLN
5000.0000 [IU] | Freq: Three times a day (TID) | INTRAMUSCULAR | Status: DC
Start: 2021-10-31 — End: 2021-11-02
  Administered 2021-10-31 – 2021-11-02 (×7): 5000 [IU] via SUBCUTANEOUS
  Filled 2021-10-30 (×7): qty 1

## 2021-10-30 MED ORDER — PROPOFOL 10 MG/ML IV BOLUS
INTRAVENOUS | Status: AC
Start: 2021-10-30 — End: ?
  Filled 2021-10-30: qty 20

## 2021-10-30 MED ORDER — MORPHINE SULFATE (PF) 2 MG/ML IV SOLN: 1.0000 mg | INTRAVENOUS | Status: DC | PRN

## 2021-10-30 MED ORDER — DEXMEDETOMIDINE (PRECEDEX) IN NS 20 MCG/5ML (4 MCG/ML) IV SYRINGE
PREFILLED_SYRINGE | INTRAVENOUS | Status: DC | PRN
  Administered 2021-10-30: 12 ug via INTRAVENOUS

## 2021-10-30 MED ORDER — DOCUSATE SODIUM 100 MG PO CAPS
100.0000 mg | ORAL_CAPSULE | Freq: Every day | ORAL | Status: DC
  Administered 2021-10-31 – 2021-11-02 (×3): 100 mg via ORAL
  Filled 2021-10-30 (×3): qty 1

## 2021-10-30 MED ORDER — PANTOPRAZOLE SODIUM 40 MG PO TBEC
40.0000 mg | DELAYED_RELEASE_TABLET | Freq: Every day | ORAL | Status: DC
  Administered 2021-10-31 – 2021-11-02 (×3): 40 mg via ORAL
  Filled 2021-10-30 (×3): qty 1

## 2021-10-30 MED ORDER — LABETALOL HCL 5 MG/ML IV SOLN: 10.0000 mg | INTRAVENOUS | Status: DC | PRN

## 2021-10-30 MED ORDER — PROPOFOL 10 MG/ML IV BOLUS
INTRAVENOUS | Status: DC | PRN
  Administered 2021-10-30: 130 mg via INTRAVENOUS

## 2021-10-30 MED ORDER — GUAIFENESIN-DM 100-10 MG/5ML PO SYRP: 15.0000 mL | ORAL_SOLUTION | ORAL | Status: DC | PRN

## 2021-10-30 MED ORDER — PHENOL 1.4 % MT LIQD: 1.0000 | OROMUCOSAL | Status: DC | PRN

## 2021-10-30 MED ORDER — FENTANYL CITRATE (PF) 250 MCG/5ML IJ SOLN
INTRAMUSCULAR | Status: AC
  Filled 2021-10-30: qty 5

## 2021-10-30 MED ORDER — METOPROLOL TARTRATE 5 MG/5ML IV SOLN: 2.0000 mg | INTRAVENOUS | Status: DC | PRN

## 2021-10-30 MED ORDER — POTASSIUM CHLORIDE CRYS ER 20 MEQ PO TBCR: 20.0000 meq | EXTENDED_RELEASE_TABLET | Freq: Every day | ORAL | Status: DC | PRN

## 2021-10-30 MED ORDER — OXYCODONE HCL 5 MG PO TABS: 5.0000 mg | ORAL_TABLET | Freq: Once | ORAL | Status: DC | PRN

## 2021-10-30 MED ORDER — CEFAZOLIN SODIUM-DEXTROSE 2-4 GM/100ML-% IV SOLN
2.0000 g | INTRAVENOUS | Status: AC
  Administered 2021-10-30: 2 g via INTRAVENOUS
  Filled 2021-10-30: qty 100

## 2021-10-30 MED ORDER — CEFAZOLIN SODIUM-DEXTROSE 2-4 GM/100ML-% IV SOLN
2.0000 g | Freq: Three times a day (TID) | INTRAVENOUS | Status: AC
  Administered 2021-10-30 – 2021-10-31 (×2): 2 g via INTRAVENOUS
  Filled 2021-10-30 (×2): qty 100

## 2021-10-30 MED ORDER — HYDROMORPHONE HCL 1 MG/ML IJ SOLN
INTRAMUSCULAR | Status: DC | PRN
  Administered 2021-10-30 (×2): .5 mg via INTRAVENOUS

## 2021-10-30 MED ORDER — HYDRALAZINE HCL 20 MG/ML IJ SOLN: 5.0000 mg | INTRAMUSCULAR | Status: DC | PRN

## 2021-10-30 MED ORDER — 0.9 % SODIUM CHLORIDE (POUR BTL) OPTIME
TOPICAL | Status: DC | PRN
  Administered 2021-10-30: 2000 mL

## 2021-10-30 MED ORDER — METFORMIN HCL ER 500 MG PO TB24
500.0000 mg | ORAL_TABLET | Freq: Two times a day (BID) | ORAL | Status: DC
  Administered 2021-10-31 – 2021-11-02 (×3): 500 mg via ORAL
  Filled 2021-10-30 (×3): qty 1

## 2021-10-30 MED ORDER — OXYCODONE-ACETAMINOPHEN 5-325 MG PO TABS: 1.0000 | ORAL_TABLET | ORAL | Status: DC | PRN

## 2021-10-30 MED ORDER — SODIUM CHLORIDE 0.9 % IV SOLN: 500.0000 mL | Freq: Once | INTRAVENOUS | Status: DC | PRN

## 2021-10-30 MED ORDER — DEXAMETHASONE SODIUM PHOSPHATE 10 MG/ML IJ SOLN
INTRAMUSCULAR | Status: DC | PRN
  Administered 2021-10-30: 5 mg via INTRAVENOUS

## 2021-10-30 MED ORDER — ORAL CARE MOUTH RINSE: 15.0000 mL | Freq: Once | OROMUCOSAL | Status: AC

## 2021-10-30 MED ORDER — ALUM & MAG HYDROXIDE-SIMETH 200-200-20 MG/5ML PO SUSP: 15.0000 mL | ORAL | Status: DC | PRN

## 2021-10-30 MED ORDER — MEPERIDINE HCL 25 MG/ML IJ SOLN: 6.2500 mg | INTRAMUSCULAR | Status: DC | PRN

## 2021-10-30 MED ORDER — PROTAMINE SULFATE 10 MG/ML IV SOLN
INTRAVENOUS | Status: AC
  Filled 2021-10-30: qty 5

## 2021-10-30 MED ORDER — ASPIRIN 81 MG PO TBEC
81.0000 mg | DELAYED_RELEASE_TABLET | Freq: Every day | ORAL | Status: DC
  Administered 2021-10-31 – 2021-11-02 (×3): 81 mg via ORAL
  Filled 2021-10-30 (×3): qty 1

## 2021-10-30 MED ORDER — SUGAMMADEX SODIUM 200 MG/2ML IV SOLN
INTRAVENOUS | Status: DC | PRN
  Administered 2021-10-30: 200 mg via INTRAVENOUS

## 2021-10-30 MED ORDER — HEPARIN SODIUM (PORCINE) 1000 UNIT/ML IJ SOLN
INTRAMUSCULAR | Status: DC | PRN
  Administered 2021-10-30: 1000 [IU] via INTRAVENOUS
  Administered 2021-10-30: 8000 [IU] via INTRAVENOUS

## 2021-10-30 MED ORDER — ACETAMINOPHEN 500 MG PO TABS
1000.0000 mg | ORAL_TABLET | Freq: Once | ORAL | Status: AC
  Administered 2021-10-30: 1000 mg via ORAL
  Filled 2021-10-30: qty 2

## 2021-10-30 MED ORDER — LIDOCAINE 2% (20 MG/ML) 5 ML SYRINGE
INTRAMUSCULAR | Status: DC | PRN
  Administered 2021-10-30: 100 mg via INTRAVENOUS

## 2021-10-30 MED ORDER — ONDANSETRON HCL 4 MG/2ML IJ SOLN: 4.0000 mg | Freq: Four times a day (QID) | INTRAMUSCULAR | Status: DC | PRN

## 2021-10-30 MED ORDER — FENTANYL CITRATE (PF) 100 MCG/2ML IJ SOLN: 25.0000 ug | INTRAMUSCULAR | Status: DC | PRN

## 2021-10-30 MED ORDER — HEPARIN 6000 UNIT IRRIGATION SOLUTION
Status: AC
  Filled 2021-10-30: qty 500

## 2021-10-30 MED ORDER — OXYCODONE HCL 5 MG/5ML PO SOLN: 5.0000 mg | Freq: Once | ORAL | Status: DC | PRN

## 2021-10-30 MED ORDER — ESMOLOL HCL 100 MG/10ML IV SOLN
INTRAVENOUS | Status: AC
  Filled 2021-10-30: qty 10

## 2021-10-30 MED ORDER — MAGNESIUM SULFATE 2 GM/50ML IV SOLN: 2.0000 g | Freq: Every day | INTRAVENOUS | Status: DC | PRN

## 2021-10-30 MED ORDER — PRAVASTATIN SODIUM 40 MG PO TABS
40.0000 mg | ORAL_TABLET | Freq: Every day | ORAL | Status: DC
Start: 2021-10-31 — End: 2021-10-31
  Administered 2021-10-31: 40 mg via ORAL
  Filled 2021-10-30: qty 1

## 2021-10-30 MED ORDER — PROTAMINE SULFATE 10 MG/ML IV SOLN
INTRAVENOUS | Status: DC | PRN
  Administered 2021-10-30: 50 mg via INTRAVENOUS

## 2021-10-30 MED ORDER — SENNOSIDES-DOCUSATE SODIUM 8.6-50 MG PO TABS: 1.0000 | ORAL_TABLET | Freq: Every evening | ORAL | Status: DC | PRN

## 2021-10-30 MED ORDER — ESMOLOL HCL 100 MG/10ML IV SOLN
INTRAVENOUS | Status: DC | PRN
  Administered 2021-10-30 (×2): 30 mg via INTRAVENOUS
  Administered 2021-10-30: 40 mg via INTRAVENOUS

## 2021-10-30 MED ORDER — ACETAMINOPHEN 650 MG RE SUPP: 325.0000 mg | RECTAL | Status: DC | PRN

## 2021-10-30 MED ORDER — FENTANYL CITRATE (PF) 250 MCG/5ML IJ SOLN
INTRAMUSCULAR | Status: DC | PRN
  Administered 2021-10-30: 100 ug via INTRAVENOUS
  Administered 2021-10-30 (×3): 50 ug via INTRAVENOUS

## 2021-10-30 MED ORDER — HEPARIN SODIUM (PORCINE) 1000 UNIT/ML IJ SOLN
INTRAMUSCULAR | Status: AC
  Filled 2021-10-30: qty 40

## 2021-10-30 MED ORDER — EPHEDRINE 5 MG/ML INJ
INTRAVENOUS | Status: AC
  Filled 2021-10-30: qty 5

## 2021-10-30 SURGICAL SUPPLY — 68 items
BAG COUNTER SPONGE SURGICOUNT (BAG) ×4 IMPLANT
BANDAGE ESMARK 6X9 LF (GAUZE/BANDAGES/DRESSINGS) IMPLANT
BNDG ELASTIC 4X5.8 VLCR STR LF (GAUZE/BANDAGES/DRESSINGS) IMPLANT
BNDG ESMARK 6X9 LF (GAUZE/BANDAGES/DRESSINGS) ×4
CANISTER SUCT 3000ML PPV (MISCELLANEOUS) ×4 IMPLANT
CANNULA VESSEL 3MM 2 BLNT TIP (CANNULA) ×1 IMPLANT
CLIP VESOCCLUDE MED 24/CT (CLIP) ×4 IMPLANT
CLIP VESOCCLUDE SM WIDE 24/CT (CLIP) ×4 IMPLANT
COVER PROBE W GEL 5X96 (DRAPES) ×2 IMPLANT
CUFF TOURN SGL QUICK 24 (TOURNIQUET CUFF)
CUFF TOURN SGL QUICK 34 (TOURNIQUET CUFF) ×1
CUFF TOURN SGL QUICK 42 (TOURNIQUET CUFF) IMPLANT
CUFF TRNQT CYL 24X4X16.5-23 (TOURNIQUET CUFF) IMPLANT
CUFF TRNQT CYL 34X4.125X (TOURNIQUET CUFF) IMPLANT
DERMABOND ADVANCED (GAUZE/BANDAGES/DRESSINGS) ×2
DERMABOND ADVANCED .7 DNX12 (GAUZE/BANDAGES/DRESSINGS) ×3 IMPLANT
DRAIN CHANNEL 15F RND FF W/TCR (WOUND CARE) IMPLANT
DRAPE HALF SHEET 40X57 (DRAPES) IMPLANT
DRAPE X-RAY CASS 24X20 (DRAPES) IMPLANT
DRESSING PREVENA PLUS CUSTOM (GAUZE/BANDAGES/DRESSINGS) IMPLANT
DRSG PREVENA PLUS CUSTOM (GAUZE/BANDAGES/DRESSINGS) ×4
ELECT REM PT RETURN 9FT ADLT (ELECTROSURGICAL) ×8
ELECTRODE REM PT RTRN 9FT ADLT (ELECTROSURGICAL) ×3 IMPLANT
EVACUATOR SILICONE 100CC (DRAIN) IMPLANT
GLOVE BIO SURGEON STRL SZ 6.5 (GLOVE) ×1 IMPLANT
GLOVE BIO SURGEON STRL SZ7.5 (GLOVE) ×1 IMPLANT
GLOVE BIOGEL PI IND STRL 8 (GLOVE) IMPLANT
GLOVE BIOGEL PI INDICATOR 8 (GLOVE) ×1
GLOVE SURG SS PI 7.5 STRL IVOR (GLOVE) ×12 IMPLANT
GOWN STRL REUS W/ TWL LRG LVL3 (GOWN DISPOSABLE) ×6 IMPLANT
GOWN STRL REUS W/ TWL XL LVL3 (GOWN DISPOSABLE) ×3 IMPLANT
GOWN STRL REUS W/TWL LRG LVL3 (GOWN DISPOSABLE) ×3
GOWN STRL REUS W/TWL XL LVL3 (GOWN DISPOSABLE) ×2
HEMOSTAT SNOW SURGICEL 2X4 (HEMOSTASIS) IMPLANT
KIT BASIN OR (CUSTOM PROCEDURE TRAY) ×4 IMPLANT
KIT TURNOVER KIT B (KITS) ×4 IMPLANT
MARKER GRAFT CORONARY BYPASS (MISCELLANEOUS) IMPLANT
NS IRRIG 1000ML POUR BTL (IV SOLUTION) ×8 IMPLANT
OCCLUDER DISP VAS  1.0 (MISCELLANEOUS) ×1
OCCLUDER DISP VAS  3.00 (MISCELLANEOUS) ×2
OCCLUDER DISP VAS 1.0 (MISCELLANEOUS) IMPLANT
OCCLUDER DISP VAS 3.00 (MISCELLANEOUS) IMPLANT
PACK PERIPHERAL VASCULAR (CUSTOM PROCEDURE TRAY) ×4 IMPLANT
PAD ARMBOARD 7.5X6 YLW CONV (MISCELLANEOUS) ×8 IMPLANT
PENCIL BUTTON HOLSTER BLD 10FT (ELECTRODE) ×1 IMPLANT
SET COLLECT BLD 21X3/4 12 (NEEDLE) IMPLANT
SPONGE T-LAP 18X18 ~~LOC~~+RFID (SPONGE) ×1 IMPLANT
STOPCOCK 4 WAY LG BORE MALE ST (IV SETS) IMPLANT
SUT ETHILON 3 0 PS 1 (SUTURE) IMPLANT
SUT PROLENE 5 0 C 1 24 (SUTURE) ×5 IMPLANT
SUT PROLENE 6 0 BV (SUTURE) ×5 IMPLANT
SUT PROLENE 7 0 BV 1 (SUTURE) IMPLANT
SUT SILK 2 0 (SUTURE) ×1
SUT SILK 2 0 SH (SUTURE) ×1 IMPLANT
SUT SILK 2-0 18XBRD TIE 12 (SUTURE) IMPLANT
SUT SILK 3 0 (SUTURE) ×1
SUT SILK 3-0 18XBRD TIE 12 (SUTURE) IMPLANT
SUT VIC AB 2-0 CT1 27 (SUTURE) ×4
SUT VIC AB 2-0 CT1 TAPERPNT 27 (SUTURE) ×6 IMPLANT
SUT VIC AB 3-0 SH 27 (SUTURE) ×4
SUT VIC AB 3-0 SH 27X BRD (SUTURE) ×6 IMPLANT
SUT VICRYL 4-0 PS2 18IN ABS (SUTURE) ×10 IMPLANT
TAPE UMBILICAL COTTON 1/8X30 (MISCELLANEOUS) IMPLANT
TOWEL GREEN STERILE (TOWEL DISPOSABLE) ×4 IMPLANT
TRAY FOLEY MTR SLVR 16FR STAT (SET/KITS/TRAYS/PACK) ×4 IMPLANT
TUBING EXTENTION W/L.L. (IV SETS) IMPLANT
UNDERPAD 30X36 HEAVY ABSORB (UNDERPADS AND DIAPERS) ×4 IMPLANT
WATER STERILE IRR 1000ML POUR (IV SOLUTION) ×4 IMPLANT

## 2021-10-30 NOTE — Transfer of Care (Signed)
Immediate Anesthesia Transfer of Care Note  Patient: Katie Hawkins  Procedure(s) Performed: LEFT FEMORAL TO PERONEAL BYPASS GRAFT USING VEIN (Left: Leg Upper) VEIN HARVEST (Left) APPLICATION OF WOUND VAC (Left: Groin)  Patient Location: PACU  Anesthesia Type:General  Level of Consciousness: drowsy, patient cooperative and responds to stimulation  Airway & Oxygen Therapy: Patient Spontanous Breathing and Patient connected to nasal cannula oxygen  Post-op Assessment: Report given to RN and Post -op Vital signs reviewed and stable  Post vital signs: Reviewed and stable  Last Vitals:  Vitals Value Taken Time  BP 136/74 10/30/21 2011  Temp    Pulse 97 10/30/21 2013  Resp 16 10/30/21 2013  SpO2 98 % 10/30/21 2013  Vitals shown include unvalidated device data.  Last Pain:  Vitals:   10/30/21 1242  PainSc: 6       Patients Stated Pain Goal: 2 (43/32/95 1884)  Complications: No notable events documented.

## 2021-10-30 NOTE — Anesthesia Preprocedure Evaluation (Addendum)
Anesthesia Evaluation  Patient identified by MRN, date of birth, ID band Patient awake    Reviewed: Allergy & Precautions, NPO status , Patient's Chart, lab work & pertinent test results, reviewed documented beta blocker date and time   History of Anesthesia Complications Negative for: history of anesthetic complications  Airway Mallampati: I  TM Distance: >3 FB Neck ROM: Full    Dental  (+) Dental Advisory Given   Pulmonary neg pulmonary ROS,    breath sounds clear to auscultation       Cardiovascular hypertension, Pt. on medications and Pt. on home beta blockers (-) angina+ Peripheral Vascular Disease   Rhythm:Regular Rate:Normal     Neuro/Psych negative neurological ROS     GI/Hepatic negative GI ROS, Neg liver ROS,   Endo/Other  diabetes (glu 179), Oral Hypoglycemic Agentsobese  Renal/GU negative Renal ROS     Musculoskeletal   Abdominal (+) + obese,   Peds  Hematology H/o Hodgkin's   Anesthesia Other Findings   Reproductive/Obstetrics                            Anesthesia Physical Anesthesia Plan  ASA: 3  Anesthesia Plan: General   Post-op Pain Management: Tylenol PO (pre-op)*   Induction: Intravenous  PONV Risk Score and Plan: 3 and Ondansetron, Dexamethasone and Treatment may vary due to age or medical condition  Airway Management Planned: Oral ETT  Additional Equipment:   Intra-op Plan:   Post-operative Plan: Extubation in OR  Informed Consent: I have reviewed the patients History and Physical, chart, labs and discussed the procedure including the risks, benefits and alternatives for the proposed anesthesia with the patient or authorized representative who has indicated his/her understanding and acceptance.     Dental advisory given  Plan Discussed with: Surgeon and CRNA  Anesthesia Plan Comments:        Anesthesia Quick Evaluation

## 2021-10-30 NOTE — Anesthesia Procedure Notes (Signed)
Procedure Name: Intubation Date/Time: 10/30/2021 4:14 PM Performed by: Janace Litten, CRNA Pre-anesthesia Checklist: Patient identified, Emergency Drugs available, Suction available and Patient being monitored Patient Re-evaluated:Patient Re-evaluated prior to induction Oxygen Delivery Method: Circle System Utilized Preoxygenation: Pre-oxygenation with 100% oxygen Induction Type: IV induction Ventilation: Mask ventilation without difficulty Laryngoscope Size: Mac and 3 Grade View: Grade I Tube type: Oral Tube size: 7.0 mm Number of attempts: 1 Airway Equipment and Method: Stylet and Oral airway Placement Confirmation: ETT inserted through vocal cords under direct vision, positive ETCO2 and breath sounds checked- equal and bilateral Secured at: 21 cm Tube secured with: Tape Dental Injury: Teeth and Oropharynx as per pre-operative assessment

## 2021-10-30 NOTE — Op Note (Signed)
Patient name: Katie Hawkins MRN: [id number] DOB: 03-Aug-1950 Sex: female  10/30/2021 Pre-operative Diagnosis: Left third toe gangrene Post-operative diagnosis:  Same Surgeon:  Annamarie Major Assistants:  Fortunato Curling, MD, Renne Musca, [place] Procedure:   #1: Left common femoral to peroneal artery bypass graft with ipsilateral nonreversed translocated saphenous vein   #2: Left groin Prevena wound VAC Anesthesia:  General Blood Loss:  200 cc Specimens:  none  Findings: Relatively disease-free common femoral artery.  The distal anastomosis was to the peroneal artery which was calcified but had a good lumen.  The vein was subfascial.  It measured approximately 3 mm throughout with the exception of a dilated portion proximally.  There was a good size match to the peroneal artery as well as the common femoral artery  Indications: This is a 71 year old female with left third toe gangrene.  Angiography reveals occluded superficial femoral-popliteal artery with single-vessel runoff via the peroneal artery.  She was scheduled for surgery last week however had a urinary tract infection.  This is now resolved with antibiotics.  She has a marginal vein on preoperative imaging.  Procedure:  The patient was identified in the holding area and taken to [facility]  The patient was then placed supine on the table. general anesthesia was administered.  The patient was prepped and draped in the usual sterile fashion.  A time out was called and antibiotics were administered.  An assistant was necessary to expedite the procedure and assist with technical details.  I began by mapping the saphenous vein throughout the leg.  It was marginal by ultrasound imaging.  I then made a medial below-knee incision.  I identified the saphenous vein.  It was approximately 2.5 mm.  I dissected out throughout the length of the incision and then protected it laterally.  I then divided the fascia and reflected the gastrocnemius muscle  posteriorly.  The soleal attachments to the tibia were taken down with cautery.  I then identified the peroneal artery which had plaque externally but was fairly soft.  I then proceeded with dissection proximally.  Identified the anterior tibial vein which was divided between silk ties.  Once I had adequate exposure of the artery, I proceeded to harvest more of the vein to see if it was usable.  This was done through skip incisions.  After I had dissected out the vein through 2 skip incisions, I was concerned that it was not adequate, and so I elected to ligated distally.  I then instilled heparin saline within the vein and it appeared to dilate to where I thought it would be adequate for bypass.  At this point, Dr. Carlis Abbott came in.  He proceeded with exposure of the common femoral artery via a oblique groin incision.  Similarly through an additional skip incision he dissected out the proximal saphenous vein.  The PA helped provide exposure with suction and retraction.  Once the vein was fully mobilized, it was ligated proximally.  It was then removed and prepared on the back table.  It distended adequately.  There was a good size match to the peroneal artery.  The proximal 5 cm of the vein were also a good size match to the common femoral artery.  I then used a long Gore tunneler to to create a subsartorial tunnel between the groin incision and the distal incision.  Next, the patient was fully heparinized.  After the heparin circulated, the common femoral, profundofemoral, and superficial femoral arteries were occluded.  A #11 blade was used to make an arteriotomy which was extended longitudinally with Potts scissors.  The arteriotomy was in the distal common femoral artery down to the origin of the profundofemoral artery.  The vein was then spatulated to fit the size the arteriotomy in a running anastomosis was created with 5-0 Prolene with the assistance of the PA following the suture.  Once this was completed, the  clamps were released.  I then used a valvulotome to lyse the valves within the vein.  2 passes were made.  There was excellent pulsatile flow through the graft at this point.  The vein was then sutured to the tunneler and brought through the tunnel making sure to maintain proper orientation.  Next, a Webril was placed in the proximal thigh followed by tourniquet.  The leg was exsanguinated with an Esmarch and the tourniquet was taken 250 mm of pressure.  I then opened the peroneal artery with a #11 blade and extended this longitudinally with Potts scissors.  The leg was straightened and the vein was cut the appropriate length and then spatulated but the size the arteriotomy.  A running anastomosis was created with 6-0 Prolene.  Prior to completion, the appropriate flushing maneuvers were performed.  The tourniquet was let down and the graft was flushed appropriately.  The anastomosis was completed.  Hand-held Doppler was used to evaluate the signal in the peroneal artery just beyond the anastomosis which was triphasic.  There was a palpable pulse within the vein graft.  There was a brisk graft dependent posterior tibial Doppler signal.  At this point, I was satisfied with the results.  The patient's heparin was reversed with 50 mg of protamine.  Once hemostasis was satisfactory, the vein harvest incisions were closed with 2 layers of Vicryl and Dermabond.  The distal incision was closed by reapproximating the fascia with 2-0 Vicryl.  The subcutaneous tissue was then closed with an additional layer of Vicryl followed by subcuticular closure.  Attention was then turned towards the groin.  This was irrigated.  Hemostasis was achieved.  The femoral sheath was reapproximated 2-0 Vicryl.  Subcutaneous tissue was then closed with multiple layers of Vicryl followed by subcuticular closure.  A Prevena wound VAC was placed in the groin incision and Dermabond was placed on the additional incisions.  The patient was then  successfully extubated.  She was taken recovery in stable condition.  There were no immediate complications.   Disposition: To PACU stable.   Theotis Burrow, M.D., [facility] [place] Office: [phone] Pager:  [phone]

## 2021-10-30 NOTE — Anesthesia Postprocedure Evaluation (Signed)
Anesthesia Post Note  Patient: Tamilyn Lupien  Procedure(s) Performed: LEFT FEMORAL TO PERONEAL BYPASS GRAFT USING VEIN (Left: Leg Upper) VEIN HARVEST (Left) APPLICATION OF WOUND VAC (Left: Groin)     Patient location during evaluation: PACU Anesthesia Type: General Level of consciousness: sedated and patient cooperative Pain management: pain level controlled Vital Signs Assessment: post-procedure vital signs reviewed and stable Respiratory status: spontaneous breathing Cardiovascular status: stable Anesthetic complications: no   No notable events documented.  Last Vitals:  Vitals:   10/30/21 2040 10/30/21 2132  BP:  (!) 136/97  Pulse:  (!) 107  Resp:  16  Temp: 37.3 C 36.7 C  SpO2:  93%    Last Pain:  Vitals:   10/30/21 2132  TempSrc: Oral  PainSc:                  Nolon Nations

## 2021-10-30 NOTE — Interval H&P Note (Signed)
History and Physical Interval Note:  10/30/2021 3:18 PM  Katie Hawkins  has presented today for surgery, with the diagnosis of I70.262.  The various methods of treatment have been discussed with the patient and family. After consideration of risks, benefits and other options for treatment, the patient has consented to  Procedure(s): FEMORALTO PERONEAL BYPASS GRAFT (Left) as a surgical intervention.  The patient's history has been reviewed, patient examined, no change in status, stable for surgery.  I have reviewed the patient's chart and labs.  Questions were answered to the patient's satisfaction.     Wells Anwita Mencer  Repeat UA is WNL.  Plan to proceed with left leg bypass

## 2021-10-30 NOTE — Discharge Instructions (Signed)
 [facility] [place]  Discharge instructions  Lower Extremity Bypass Surgery  Please refer to the following instruction for your post-procedure care. Your surgeon or physician assistant will discuss any changes with you.  Activity  You are encouraged to walk as much as you can. You can slowly return to normal activities during the month after your surgery. Avoid strenuous activity and heavy lifting until your doctor tells you it's OK. Avoid activities such as vacuuming or swinging a golf club. Do not drive until your doctor give the OK and you are no longer taking prescription pain medications. It is also normal to have difficulty with sleep habits, eating and bowel movement after surgery. These will go away with time.  Bathing/Showering  Shower daily after you go home. Do not soak in a bathtub, hot tub, or swim until the incision heals completely.  Incision Care  Clean your incision with mild soap and water. Shower every day. Pat the area dry with a clean towel. You do not need a bandage unless otherwise instructed. Do not apply any ointments or creams to your incision. If you have open wounds you will be instructed how to care for them or a visiting nurse may be arranged for you. If you have staples or sutures along your incision they will be removed at your post-op appointment. You may have skin glue on your incision. Do not peel it off. It will come off on its own in about one week.  Wash the groin wound with soap and water daily and pat dry. (No tub bath-only shower)  Then put a dry gauze or washcloth in the groin to keep this area dry to help prevent wound infection.  Do this daily and as needed.  Do not use Vaseline or neosporin on your incisions.  Only use soap and water on your incisions and then protect and keep dry.  Diet  Resume your normal diet. There are no special food restrictions following this procedure. A low fat/ low cholesterol diet is  recommended for all patients with vascular disease. In order to heal from your surgery, it is CRITICAL to get adequate nutrition. Your body requires vitamins, minerals, and protein. Vegetables are the best source of vitamins and minerals. Vegetables also provide the perfect balance of protein. Processed food has little nutritional value, so try to avoid this.  Medications  Resume taking all your medications unless your doctor or physician assistant tells you not to. If your incision is causing pain, you may take over-the-counter pain relievers such as acetaminophen (Tylenol). If you were prescribed a stronger pain medication, please aware these medication can cause nausea and constipation. Prevent nausea by taking the medication with a snack or meal. Avoid constipation by drinking plenty of fluids and eating foods with high amount of fiber, such as fruits, vegetables, and grains. Take Colace 100 mg (an over-the-counter stool softener) twice a day as needed for constipation.  Do not take Tylenol if you are taking prescription pain medications.  Follow Up  Our office will schedule a follow up appointment 2-3 weeks following discharge.  Please call us immediately for any of the following conditions  Severe or worsening pain in your legs or feet while at rest or while walking Increase pain, redness, warmth, or drainage (pus) from your incision site(s) Fever of 101 degree or higher The swelling in your leg with the bypass suddenly worsens and becomes more painful than when you were in the hospital If you have   been instructed to feel your graft pulse then you should do so every day. If you can no longer feel this pulse, call the office immediately. Not all patients are given this instruction.  Leg swelling is common after leg bypass surgery.  The swelling should improve over a few months following surgery. To improve the swelling, you may elevate your legs above the level of your heart while you are  sitting or resting. Your surgeon or physician assistant may ask you to apply an ACE wrap or wear compression (TED) stockings to help to reduce swelling.  Reduce your risk of vascular disease  Stop smoking. If you would like help call [facility] at 1-800-QUIT-NOW (1-800-784-8669) or [place] at 336-586-4000.  Manage your cholesterol Maintain a desired weight Control your diabetes weight Control your diabetes Keep your blood pressure down  If you have any questions, please call the office at [phone]  

## 2021-10-31 ENCOUNTER — Encounter (HOSPITAL_COMMUNITY): Payer: Self-pay | Admitting: Surgery

## 2021-10-31 ENCOUNTER — Telehealth: Payer: Self-pay | Admitting: Podiatry

## 2021-10-31 LAB — GLUCOSE, CAPILLARY
Glucose-Capillary: 184 mg/dL — ABNORMAL HIGH (ref 70–99)
Glucose-Capillary: 188 mg/dL — ABNORMAL HIGH (ref 70–99)
Glucose-Capillary: 204 mg/dL — ABNORMAL HIGH (ref 70–99)
Glucose-Capillary: 121 mg/dL — ABNORMAL HIGH (ref 70–99)

## 2021-10-31 LAB — CBC
HCT: 32.5 % — ABNORMAL LOW (ref 36.0–46.0)
Hemoglobin: 10.8 g/dL — ABNORMAL LOW (ref 12.0–15.0)
MCH: 29.9 pg (ref 26.0–34.0)
MCHC: 33.2 g/dL (ref 30.0–36.0)
MCV: 90 fL (ref 80.0–100.0)
Platelets: 371 10*3/uL (ref 150–400)
RBC: 3.61 MIL/uL — ABNORMAL LOW (ref 3.87–5.11)
RDW: 13.4 % (ref 11.5–15.5)
WBC: 18.9 10*3/uL — ABNORMAL HIGH (ref 4.0–10.5)
nRBC: 0 % (ref 0.0–0.2)

## 2021-10-31 LAB — LIPID PANEL
Cholesterol: 111 mg/dL (ref 0–200)
HDL: 45 mg/dL (ref >40)
LDL Cholesterol: 57 mg/dL (ref 0–99)
Total CHOL/HDL Ratio: 2.5 RATIO
Triglycerides: 46 mg/dL (ref <150)
VLDL: 9 mg/dL (ref 0–40)

## 2021-10-31 LAB — BASIC METABOLIC PANEL
Anion gap: 9 (ref 5–15)
BUN: 8 mg/dL (ref 8–23)
CO2: 22 mmol/L (ref 22–32)
Calcium: 8.9 mg/dL (ref 8.9–10.3)
Chloride: 100 mmol/L (ref 98–111)
Creatinine, Ser: 0.6 mg/dL (ref 0.44–1.00)
GFR, Estimated: >60 mL/min (ref >60)
Glucose, Bld: 222 mg/dL — ABNORMAL HIGH (ref 70–99)
Potassium: 4.1 mmol/L (ref 3.5–5.1)
Sodium: 131 mmol/L — ABNORMAL LOW (ref 135–145)

## 2021-10-31 MED ORDER — ROSUVASTATIN CALCIUM 20 MG PO TABS
20.0000 mg | ORAL_TABLET | Freq: Every day | ORAL | Status: DC
  Administered 2021-11-01 – 2021-11-02 (×2): 20 mg via ORAL
  Filled 2021-10-31 (×2): qty 1

## 2021-10-31 NOTE — Progress Notes (Signed)
Mobility Specialist: Progress Note   10/31/21 1500  Mobility  Activity Ambulated independently in hallway  Level of Assistance Contact guard assist, steadying assist  Assistive Device None  Distance Ambulated (ft) 330 ft  Activity Response Tolerated well  $Mobility charge 1 Mobility   During Mobility: 135 HR Post-Mobility: 110 HR, 141/53 (79) BP, 98% SpO2  Received pt in chair having no complaints and agreeable to mobility. Asymptomatic throughout ambulation, returned back to chair w/ call bell in reach and all needs met.  [facility] Katie Hawkins Mobility Specialist Mobility Specialist [address]: [phone]

## 2021-10-31 NOTE — Progress Notes (Addendum)
OT Cancellation Note  Patient Details Name: Katie Hawkins MRN: [id number] DOB: July 21, 1950   Cancelled Treatment:    Reason Eval/Treat Not Completed: OT screened, no needs identified, will sign off  Metta Clines 10/31/2021, 9:19 AM 10/31/2021  RP, OTR/L  Acute Rehabilitation Services  Office:  [phone]

## 2021-10-31 NOTE — Progress Notes (Addendum)
Progress Note    10/31/2021 8:14 AM 1 Day Post-Op  Subjective:  says last night was first night in a month that she slept without pain in her foot. She is very happy with how her pain is resolved   Vitals:   10/31/21 0413 10/31/21 0701  BP: (!) 121/34 (!) 121/93  Pulse: (!) 104 (!) 117  Resp: 20 19  Temp: 98.6 F (37 C) 98.2 F (36.8 C)  SpO2: 96% 97%   Physical Exam: Cardiac:  tachycardic Lungs:  non labored Incisions:  Left groin with prevena vac to suction, good seal. Left medial thigh incision mildly oozing otherwise incisions clean dry and intact without hematoma Extremities:  well perfused and warm. Palpable left PT. Left 3rd toe dry gangrene unchanged. 3rd toe remains mildly erythematous Abdomen:  obese, soft Neurologic: alert and oriented  CBC    Component Value Date/Time   WBC 18.9 (H) 10/31/2021 0045   RBC 3.61 (L) 10/31/2021 0045   HGB 10.8 (L) 10/31/2021 0045   HGB 13.4 02/20/2014 1159   HCT 32.5 (L) 10/31/2021 0045   HCT 41.0 02/20/2014 1159   PLT 371 10/31/2021 0045   PLT 318 02/20/2014 1159   MCV 90.0 10/31/2021 0045   MCV 89.8 02/20/2014 1159   MCH 29.9 10/31/2021 0045   MCHC 33.2 10/31/2021 0045   RDW 13.4 10/31/2021 0045   RDW 13.6 02/20/2014 1159   LYMPHSABS 1.7 02/20/2014 1159   MONOABS 0.8 02/20/2014 1159   EOSABS 0.1 02/20/2014 1159   BASOSABS 0.1 02/20/2014 1159    BMET    Component Value Date/Time   NA 131 (L) 10/31/2021 0045   NA 140 02/20/2014 1159   K 4.1 10/31/2021 0045   K 3.7 02/20/2014 1159   CL 100 10/31/2021 0045   CL 103 08/11/2012 0838   CO2 22 10/31/2021 0045   CO2 27 02/20/2014 1159   GLUCOSE 222 (H) 10/31/2021 0045   GLUCOSE 176 (H) 02/20/2014 1159   GLUCOSE 165 (H) 08/11/2012 0838   BUN 8 10/31/2021 0045   BUN 16.6 02/20/2014 1159   CREATININE 0.60 10/31/2021 0045   CREATININE 0.7 02/20/2014 1159   CALCIUM 8.9 10/31/2021 0045   CALCIUM 10.0 02/20/2014 1159   GFRNONAA >60 10/31/2021 0045   GFRAA >60  11/22/2010 1515    INR    Component Value Date/Time   INR 1.0 10/23/2021 1005     Intake/Output Summary (Last 24 hours) at 10/31/2021 3875 Last data filed at 10/31/2021 6433 Gross per 24 hour  Intake 1950 ml  Output 685 ml  Net 1265 ml     Assessment/Plan:  71 y.o. female is s/p  #1: Left common femoral to peroneal artery bypass graft with ipsilateral nonreversed translocated saphenous vein #2: Left groin Prevena wound VAC 1 day Post op  Doing well post op Rest pain resolved Prevena vac to left groin otherwise incisions are intact and well appearing Apply dry gauze to proximal medial thigh incision as needed for oozing LLE well perfused and warm with palpable PT Tachycardic overnight WBC 18.9. Suspect this is reactive but will monitor Left 3rd toe gangrene stable. Will allow to demarcate PT/ OT to evaluate today with recs Mobilize as tolerated   Karoline Caldwell, PA-C [facility] [phone] 10/31/2021 8:14 AM  I agree with the above.  I have seen and evaluated the patient.  She has a brisk pedal Doppler signal, postop day 1 from femoral peroneal bypass.  She did not have pain overnight in  her toe.  I have spoken with her about having her third toe amputated.  I spoke with podiatry today and they will see her this evening with plans for amputation tomorrow.  She will continue aspirin and statin therapy  Annamarie Major

## 2021-10-31 NOTE — Consult Note (Signed)
Reason for Consult: Gangrene of left third toe Referring Physician: Dr. Vic Blackbird Katie Hawkins is an 71 y.o. female.  HPI: She is a history of uncontrolled type 2 diabetes her A1c is about 8.5%.  She also has a history of peripheral arterial disease, she underwent bypass surgery yesterday with Dr. Trula Slade with a femoral to peroneal bypass graft.  Perfusion has improved significantly.  She has had painful gangrene of the third toe this started in late April.  Past Medical History:  Diagnosis Date   Diabetes mellitus    History of blood transfusion    HTN (hypertension), benign 02/13/2012   Hypercholesterolemia    Hyperlipidemia    Hypertension    nodular lymphocyte predominate hodgkins lymphoma dx'd 10/2010   xrt comp 02/20/11   Peripheral vascular disease [facility])     Past Surgical History:  Procedure Laterality Date   ABDOMINAL AORTOGRAM W/LOWER EXTREMITY N/A 10/18/2021   Procedure: ABDOMINAL AORTOGRAM W/LOWER EXTREMITY;  Surgeon: Cherre Robins, MD;  Location: [facility];  Service: Cardiovascular;  Laterality: N/A;   APPLICATION OF WOUND VAC Left 10/30/2021   Procedure: APPLICATION OF WOUND VAC;  Surgeon: Serafina Mitchell, MD;  Location: [facility] OR;  Service: Vascular;  Laterality: Left;   BYPASS GRAFT POPLITEAL TO TIBIAL Left 10/30/2021   Procedure: LEFT FEMORAL TO PERONEAL BYPASS GRAFT USING VEIN;  Surgeon: Serafina Mitchell, MD;  Location: [facility] OR;  Service: Vascular;  Laterality: Left;   CESAREAN SECTION     2   DILATION AND CURETTAGE OF UTERUS     VEIN HARVEST Left 10/30/2021   Procedure: VEIN HARVEST;  Surgeon: Serafina Mitchell, MD;  Location: [facility] OR;  Service: Vascular;  Laterality: Left;    Family History  Problem Relation Age of Onset   Diabetes Father    Cancer Maternal Grandfather        lung   Cancer Paternal Grandfather        leukemia    Social History:  reports that she has never smoked. She has never used smokeless tobacco. She reports current alcohol use. She  reports that she does not use drugs.  Allergies:  Allergies  Allergen Reactions   Linagliptin     didn't like how she felt on this    Medications: I have reviewed the patient's current medications.  Results for orders placed or performed during the hospital encounter of 10/30/21 (from the past 48 hour(s))  Glucose, capillary     Status: Abnormal   Collection Time: 10/30/21 11:39 AM  Result Value Ref Range   Glucose-Capillary 179 (H) 70 - 99 mg/dL    Comment: Glucose reference range applies only to samples taken after fasting for at least 8 hours.  Type and screen     Status: None   Collection Time: 10/30/21 12:35 PM  Result Value Ref Range   ABO/RH(D) B NEG    Antibody Screen NEG    Sample Expiration      11/02/2021,2359 Performed at [facility], [place] [address], [place], [place] [address]   Urinalysis, Routine w reflex microscopic Urine, Clean Catch     Status: Abnormal   Collection Time: 10/30/21  1:10 PM  Result Value Ref Range   Color, Urine STRAW (A) YELLOW   APPearance CLEAR CLEAR   Specific Gravity, Urine 1.005 1.005 - 1.030   pH 6.0 5.0 - 8.0   Glucose, UA NEGATIVE NEGATIVE mg/dL   Hgb urine dipstick NEGATIVE NEGATIVE   Bilirubin Urine  NEGATIVE NEGATIVE   Ketones, ur 5 (A) NEGATIVE mg/dL   Protein, ur NEGATIVE NEGATIVE mg/dL   Nitrite NEGATIVE NEGATIVE   Leukocytes,Ua NEGATIVE NEGATIVE    Comment: Performed at [facility] [address], [place], [place] [address]  Glucose, capillary     Status: Abnormal   Collection Time: 10/30/21  1:42 PM  Result Value Ref Range   Glucose-Capillary 141 (H) 70 - 99 mg/dL    Comment: Glucose reference range applies only to samples taken after fasting for at least 8 hours.  Glucose, capillary     Status: Abnormal   Collection Time: 10/30/21  4:00 PM  Result Value Ref Range   Glucose-Capillary 141 (H) 70 - 99 mg/dL    Comment: Glucose reference range applies only to samples taken after fasting for at least 8  hours.   Comment 1 Notify RN    Comment 2 Document in Chart   Glucose, capillary     Status: Abnormal   Collection Time: 10/30/21  8:14 PM  Result Value Ref Range   Glucose-Capillary 176 (H) 70 - 99 mg/dL    Comment: Glucose reference range applies only to samples taken after fasting for at least 8 hours.  CBC     Status: Abnormal   Collection Time: 10/31/21 12:45 AM  Result Value Ref Range   WBC 18.9 (H) 4.0 - 10.5 K/uL   RBC 3.61 (L) 3.87 - 5.11 MIL/uL   Hemoglobin 10.8 (L) 12.0 - 15.0 g/dL   HCT 32.5 (L) 36.0 - 46.0 %   MCV 90.0 80.0 - 100.0 fL   MCH 29.9 26.0 - 34.0 pg   MCHC 33.2 30.0 - 36.0 g/dL   RDW 13.4 11.5 - 15.5 %   Platelets 371 150 - 400 K/uL   nRBC 0.0 0.0 - 0.2 %    Comment: Performed at [facility], [place] [address], [place], [place] [address]  Basic metabolic panel     Status: Abnormal   Collection Time: 10/31/21 12:45 AM  Result Value Ref Range   Sodium 131 (L) 135 - 145 mmol/L   Potassium 4.1 3.5 - 5.1 mmol/L   Chloride 100 98 - 111 mmol/L   CO2 22 22 - 32 mmol/L   Glucose, Bld 222 (H) 70 - 99 mg/dL    Comment: Glucose reference range applies only to samples taken after fasting for at least 8 hours.   BUN 8 8 - 23 mg/dL   Creatinine, Ser 0.60 0.44 - 1.00 mg/dL   Calcium 8.9 8.9 - 10.3 mg/dL   GFR, Estimated >60 >60 mL/min    Comment: (NOTE) Calculated using the CKD-EPI Creatinine Equation (2021)    Anion gap 9 5 - 15    Comment: Performed at [facility] [address] [place], [place] [address]  Lipid panel     Status: None   Collection Time: 10/31/21 12:45 AM  Result Value Ref Range   Cholesterol 111 0 - 200 mg/dL   Triglycerides 46 <150 mg/dL   HDL 45 >40 mg/dL   Total CHOL/HDL Ratio 2.5 RATIO   VLDL 9 0 - 40 mg/dL   LDL Cholesterol 57 0 - 99 mg/dL    Comment:        Total Cholesterol/HDL:CHD Risk Coronary Heart Disease Risk Table                     Men   Women  1/2 Average Risk   3.4  3.3  Average Risk       5.0   4.4  2 X  Average Risk   9.6   7.1  3 X Average Risk  23.4   11.0        Use the calculated Patient Ratio above and the CHD Risk Table to determine the patient's CHD Risk.        ATP III CLASSIFICATION (LDL):  <100     mg/dL   Optimal  100-129  mg/dL   Near or Above                    Optimal  130-159  mg/dL   Borderline  160-189  mg/dL   High  >190     mg/dL   Very High Performed at [facility] [address] [place], [place] [address]   Glucose, capillary     Status: Abnormal   Collection Time: 10/31/21  7:02 AM  Result Value Ref Range   Glucose-Capillary 184 (H) 70 - 99 mg/dL    Comment: Glucose reference range applies only to samples taken after fasting for at least 8 hours.  Glucose, capillary     Status: Abnormal   Collection Time: 10/31/21 12:12 PM  Result Value Ref Range   Glucose-Capillary 188 (H) 70 - 99 mg/dL    Comment: Glucose reference range applies only to samples taken after fasting for at least 8 hours.  Glucose, capillary     Status: Abnormal   Collection Time: 10/31/21  4:59 PM  Result Value Ref Range   Glucose-Capillary 204 (H) 70 - 99 mg/dL    Comment: Glucose reference range applies only to samples taken after fasting for at least 8 hours.  Glucose, capillary     Status: Abnormal   Collection Time: 10/31/21  9:10 PM  Result Value Ref Range   Glucose-Capillary 121 (H) 70 - 99 mg/dL    Comment: Glucose reference range applies only to samples taken after fasting for at least 8 hours.   Comment 1 Notify RN    Comment 2 Document in Chart     No results found.  Review of Systems  Skin:        Pain, gangrene in third toe L foot   Blood pressure 117/67, pulse 97, temperature 98.2 F (36.8 C), temperature source Oral, resp. rate 19, height '5\' 3"'$  (1.6 m), weight 78.9 kg, SpO2 98 %.  Vitals:   10/31/21 1653 10/31/21 2016  BP: (!) 115/58 117/67  Pulse: 97 97  Resp: 15 19  Temp: 98.2 F (36.8 C) 98.2 F (36.8 C)  SpO2: 98% 98%    General AA&O x3.  Normal mood and affect.  Vascular Unable to appreciate a palpable pulse but the foot is warm and well-perfused good capillary fill time now  Neurologic Epicritic sensation grossly present.  Dermatologic (Wound) Dry gangrene of third toe no purulence mild periwound erythema, very painful  Orthopedic: Motor intact BLE.       Assessment/Plan:  Gangrene left third toe -Imaging: Studies independently reviewed -Antibiotics: Expect will be able to discharge on p.o. antibiotics likely doxycycline 100 mg twice daily after surgery -WB Status: WBAT, she will be WBAT in a postop shoe after surgery -Surgical Plan: Plan for left third toe amputation tomorrow.  We discussed the option of doing as an outpatient but she would like to have this definitively treated as soon as possible.  I discussed with her we will take her to the  operating room tomorrow evening she is n.p.o. after 8:30 AM may have breakfast before this.  She should be able to be discharged home Saturday morning and I will have my office arrange for postoperative follow-up.  Katie Hawkins 10/31/2021, 9:45 PM   Best available via secure chat for questions or concerns.

## 2021-10-31 NOTE — Progress Notes (Signed)
PHARMACIST LIPID MONITORING   Katie Hawkins is a 71 y.o. female admitted on 10/30/2021 with peripheral vascular disease.  Pharmacy has been consulted to optimize lipid-lowering therapy with the indication of secondary prevention for clinical ASCVD.  Recent Labs:  Lipid Panel (last 6 months):   Lab Results  Component Value Date   CHOL 111 10/31/2021   TRIG 46 10/31/2021   HDL 45 10/31/2021   CHOLHDL 2.5 10/31/2021   VLDL 9 10/31/2021   LDLCALC 57 10/31/2021    Hepatic function panel (last 6 months):   Lab Results  Component Value Date   AST 23 10/23/2021   ALT 13 10/23/2021   ALKPHOS 60 10/23/2021   BILITOT 0.8 10/23/2021    SCr (since admission):   Serum creatinine: 0.6 mg/dL 10/31/21 0045 Estimated creatinine clearance: 63.2 mL/min  Current therapy and lipid therapy tolerance Current lipid-lowering therapy: pravastatin '40mg'$  and zetia '10mg'$  Previous lipid-lowering therapies (if applicable): none Documented or reported allergies or intolerances to lipid-lowering therapies (if applicable): none  Assessment:   Patient agrees with changes to lipid-lowering therapy  Plan:    1.Statin intensity (high intensity recommended for all patients regardless of the LDL):  Add or increase statin to high intensity.  2.Add ezetimibe (if any one of the following):   already taking zetia  3.Refer to lipid clinic:   No  4.Follow-up with:  Primary care provider - Carol Ada, MD  5.Follow-up labs after discharge:  Changes in lipid therapy were made. Check a lipid panel in 8-12 weeks then annually.     Erin Hearing PharmD., BCPS Clinical Pharmacist 10/31/2021 3:33 PM

## 2021-10-31 NOTE — Telephone Encounter (Signed)
Received call from Dr Etta Quill a vascular surgeon @ [facility] called asking who provider on call was that he called the cell phone and it went to voicemail.  I told him Dr Sherryle Lis is doctor on call and I have sent him a secure chat with the providers name and pts information for him to call Dr Etta Quill back. His # is [phone]

## 2021-10-31 NOTE — Telephone Encounter (Signed)
I spoke with him this morning and will see the patient

## 2021-10-31 NOTE — Evaluation (Signed)
Physical Therapy Evaluation & Discharge Patient Details Name: Katie Hawkins MRN: [id number] DOB: 04/11/1951 Today's Date: 10/31/2021  History of Present Illness  Pt is a 71 y.o. female s/p L femoral to peroneal bypass graft with wound vac placement 10/30/21. PMH includes DM, HTN, HLD, PVD.   Clinical Impression  Patient evaluated by Physical Therapy with no further acute PT needs identified. PTA, pt independent without DME, values her independence, lives with supportive husband. Today, pt moving well with supervision for safety/lines; pt notes significant improvement in LLE pain ("Last night was the first time I slept without my toe pain waking me up"). Educ re: precautions, positioning, AROM, activity recommendations; encouraged hallway ambulation with husband or staff assist. All education has been completed and the patient has no further questions. Acute PT is signing off. Thank you for this referral.    HR up to 150s with activity    Recommendations for follow up therapy are one component of a multi-disciplinary discharge planning process, led by the attending physician.  Recommendations may be updated based on patient status, additional functional criteria and insurance authorization.  Follow Up Recommendations No PT follow up    Assistance Recommended at Discharge PRN  Patient can return home with the following  Assistance with cooking/housework;Assist for transportation;Help with stairs or ramp for entrance    Equipment Recommendations None recommended by PT  Recommendations for Other Services   N/A   Functional Status Assessment       Precautions / Restrictions Precautions Precautions: Fall;Other (comment) Precaution Comments: Watch HR (tachycardia) Restrictions Weight Bearing Restrictions: No      Mobility  Bed Mobility Overal bed mobility: Independent                  Transfers Overall transfer level: Independent Equipment used: Rolling walker (2 wheels),  None               General transfer comment: initial sit<>stand from EOB and low toilet mod indep with RW; additional trial without DME, indep    Ambulation/Gait Ambulation/Gait assistance: Supervision Gait Distance (Feet): 300 Feet Assistive device: Rolling walker (2 wheels), None Gait Pattern/deviations: Step-through pattern, Decreased stride length Gait velocity: Decreased     General Gait Details: initial gait trial in room mod indep with RW; additional hallway ambulation without DME, supervision for safety/lines, 1x self-corrected instability  Stairs            Wheelchair Mobility    Modified Rankin (Stroke Patients Only)       Balance Overall balance assessment: Needs assistance   Sitting balance-Leahy Scale: Good Sitting balance - Comments: indep with pericare, able to reach bilateral feet   Standing balance support: No upper extremity supported, During functional activity Standing balance-Leahy Scale: Good                               Pertinent Vitals/Pain Pain Assessment Pain Assessment: No/denies pain    Home Living Family/patient expects to be discharged to:: Private residence Living Arrangements: Spouse/significant other Available Help at Discharge: Family;Available 24 hours/day Type of Home: House Home Access: Stairs to enter Entrance Stairs-Rails: None (doorframe/wall) Entrance Stairs-Number of Steps: 2   Home Layout: One level Home Equipment: Conservation officer, nature (2 wheels);BSC/3in1;Shower seat      Prior Function Prior Level of Function : Independent/Modified Independent             Mobility Comments: indep without DME; emjoys time with dog and  two cats       Hand Dominance        Extremity/Trunk Assessment   Upper Extremity Assessment Upper Extremity Assessment: Overall WFL for tasks assessed    Lower Extremity Assessment Lower Extremity Assessment: LLE deficits/detail LLE Deficits / Details: s/p bypass  graft with wound vac in groin; demonstrates >3/5 functional strength and good AROM    Cervical / Trunk Assessment Cervical / Trunk Assessment: Normal  Communication   Communication: No difficulties  Cognition Arousal/Alertness: Awake/alert Behavior During Therapy: WFL for tasks assessed/performed Overall Cognitive Status: Within Functional Limits for tasks assessed                                          General Comments General comments (skin integrity, edema, etc.): pt's husband present and spupportive. HR up to 150s with activity    Exercises Other Exercises Other Exercises: ankle pumps, knee flex/ext, hip flex/ext/ER/ABD - AROM   Assessment/Plan    PT Assessment Patient does not need any further PT services  PT Problem List         PT Treatment Interventions      PT Goals (Current goals can be found in the Care Plan section)  Acute Rehab PT Goals PT Goal Formulation: All assessment and education complete, DC therapy    Frequency       Co-evaluation               AM-PAC PT "6 Clicks" Mobility  Outcome Measure Help needed turning from your back to your side while in a flat bed without using bedrails?: None Help needed moving from lying on your back to sitting on the side of a flat bed without using bedrails?: None Help needed moving to and from a bed to a chair (including a wheelchair)?: None Help needed standing up from a chair using your arms (e.g., wheelchair or bedside chair)?: None Help needed to walk in hospital room?: A Little Help needed climbing 3-5 steps with a railing? : A Little 6 Click Score: 22    End of Session Equipment Utilized During Treatment: Gait belt Activity Tolerance: Patient tolerated treatment well Patient left: in chair;with call bell/phone within reach;with family/visitor present Nurse Communication: Mobility status PT Visit Diagnosis: Other abnormalities of gait and mobility (R26.89)    Time: 3361-2244 PT  Time Calculation (min) (ACUTE ONLY): 20 min   Charges:   PT Evaluation $PT Eval Low Complexity: 1 Low        Mabeline Caras, PT, DPT Acute Rehabilitation Services  Pager [phone] Office [place] 10/31/2021, 9:32 AM

## 2021-11-01 ENCOUNTER — Encounter (HOSPITAL_COMMUNITY)
Admission: RE | Disposition: A | Discharge status: Home / Self Care | Payer: Self-pay | Source: Home / Self Care | Attending: Surgery

## 2021-11-01 ENCOUNTER — Encounter (HOSPITAL_COMMUNITY): Payer: Self-pay | Admitting: Surgery

## 2021-11-01 ENCOUNTER — Inpatient Hospital Stay (HOSPITAL_COMMUNITY): Payer: Medicare HMO | Admitting: Certified Registered"

## 2021-11-01 ENCOUNTER — Other Ambulatory Visit: Payer: Self-pay

## 2021-11-01 ENCOUNTER — Other Ambulatory Visit (HOSPITAL_COMMUNITY): Payer: Self-pay

## 2021-11-01 DIAGNOSIS — E1152 Type 2 diabetes mellitus with diabetic peripheral angiopathy with gangrene: Secondary | ICD-10-CM

## 2021-11-01 DIAGNOSIS — I96 Gangrene, not elsewhere classified: Secondary | ICD-10-CM

## 2021-11-01 DIAGNOSIS — I10 Essential (primary) hypertension: Secondary | ICD-10-CM

## 2021-11-01 DIAGNOSIS — E785 Hyperlipidemia, unspecified: Secondary | ICD-10-CM

## 2021-11-01 DIAGNOSIS — D649 Anemia, unspecified: Secondary | ICD-10-CM

## 2021-11-01 HISTORY — PX: AMPUTATION TOE: SHX6595

## 2021-11-01 LAB — GLUCOSE, CAPILLARY
Glucose-Capillary: 171 mg/dL — ABNORMAL HIGH (ref 70–99)
Glucose-Capillary: 183 mg/dL — ABNORMAL HIGH (ref 70–99)
Glucose-Capillary: 168 mg/dL — ABNORMAL HIGH (ref 70–99)
Glucose-Capillary: 167 mg/dL — ABNORMAL HIGH (ref 70–99)
Glucose-Capillary: 168 mg/dL — ABNORMAL HIGH (ref 70–99)
Glucose-Capillary: 259 mg/dL — ABNORMAL HIGH (ref 70–99)

## 2021-11-01 SURGERY — AMPUTATION, TOE
Anesthesia: Monitor Anesthesia Care | Site: Toe | Laterality: Left

## 2021-11-01 MED ORDER — PROPOFOL 10 MG/ML IV BOLUS
INTRAVENOUS | Status: AC
  Filled 2021-11-01: qty 20

## 2021-11-01 MED ORDER — ONDANSETRON HCL 4 MG/2ML IJ SOLN
INTRAMUSCULAR | Status: DC | PRN
  Administered 2021-11-01: 4 mg via INTRAVENOUS

## 2021-11-01 MED ORDER — LIDOCAINE 2% (20 MG/ML) 5 ML SYRINGE
INTRAMUSCULAR | Status: AC
  Filled 2021-11-01: qty 5

## 2021-11-01 MED ORDER — CHLORHEXIDINE GLUCONATE 0.12 % MT SOLN
OROMUCOSAL | Status: AC
  Administered 2021-11-01: 15 mL via OROMUCOSAL
  Filled 2021-11-01: qty 15

## 2021-11-01 MED ORDER — LIDOCAINE 2% (20 MG/ML) 5 ML SYRINGE
INTRAMUSCULAR | Status: DC | PRN
  Administered 2021-11-01: 40 mg via INTRAVENOUS

## 2021-11-01 MED ORDER — CHLORHEXIDINE GLUCONATE 0.12 % MT SOLN: 15.0000 mL | Freq: Once | OROMUCOSAL | Status: AC

## 2021-11-01 MED ORDER — CEFAZOLIN SODIUM-DEXTROSE 2-3 GM-%(50ML) IV SOLR
INTRAVENOUS | Status: DC | PRN
  Administered 2021-11-01: 2 g via INTRAVENOUS

## 2021-11-01 MED ORDER — BUPIVACAINE HCL (PF) 0.5 % IJ SOLN
INTRAMUSCULAR | Status: AC
  Filled 2021-11-01: qty 30

## 2021-11-01 MED ORDER — FENTANYL CITRATE (PF) 250 MCG/5ML IJ SOLN
INTRAMUSCULAR | Status: AC
  Filled 2021-11-01: qty 5

## 2021-11-01 MED ORDER — BUPIVACAINE HCL (PF) 0.5 % IJ SOLN
INTRAMUSCULAR | Status: DC | PRN
  Administered 2021-11-01: 30 mL

## 2021-11-01 MED ORDER — ORAL CARE MOUTH RINSE: 15.0000 mL | Freq: Once | OROMUCOSAL | Status: AC

## 2021-11-01 MED ORDER — CEFAZOLIN SODIUM 1 G IJ SOLR
INTRAMUSCULAR | Status: AC
  Filled 2021-11-01: qty 20

## 2021-11-01 MED ORDER — SUCCINYLCHOLINE CHLORIDE 200 MG/10ML IV SOSY
PREFILLED_SYRINGE | INTRAVENOUS | Status: AC
Start: 2021-11-01 — End: ?
  Filled 2021-11-01: qty 20

## 2021-11-01 MED ORDER — ONDANSETRON HCL 4 MG/2ML IJ SOLN
INTRAMUSCULAR | Status: AC
  Filled 2021-11-01: qty 2

## 2021-11-01 MED ORDER — LACTATED RINGERS IV SOLN: INTRAVENOUS | Status: DC

## 2021-11-01 MED ORDER — BUPIVACAINE HCL (PF) 0.25 % IJ SOLN
INTRAMUSCULAR | Status: AC
  Filled 2021-11-01: qty 30

## 2021-11-01 MED ORDER — KETOROLAC TROMETHAMINE 30 MG/ML IJ SOLN
INTRAMUSCULAR | Status: AC
  Filled 2021-11-01: qty 1

## 2021-11-01 MED ORDER — HYDROMORPHONE HCL 1 MG/ML IJ SOLN: 0.2500 mg | INTRAMUSCULAR | Status: DC | PRN

## 2021-11-01 MED ORDER — 0.9 % SODIUM CHLORIDE (POUR BTL) OPTIME
TOPICAL | Status: DC | PRN
  Administered 2021-11-01: 1000 mL

## 2021-11-01 MED ORDER — PROPOFOL 10 MG/ML IV BOLUS
INTRAVENOUS | Status: DC | PRN
  Administered 2021-11-01: 30 mg via INTRAVENOUS

## 2021-11-01 MED ORDER — PHENYLEPHRINE 80 MCG/ML (10ML) SYRINGE FOR IV PUSH (FOR BLOOD PRESSURE SUPPORT)
PREFILLED_SYRINGE | INTRAVENOUS | Status: AC
  Filled 2021-11-01: qty 20

## 2021-11-01 MED ORDER — PROPOFOL 500 MG/50ML IV EMUL
INTRAVENOUS | Status: DC | PRN
  Administered 2021-11-01: 75 ug/kg/min via INTRAVENOUS

## 2021-11-01 MED ORDER — INSULIN ASPART 100 UNIT/ML IJ SOLN: 0.0000 [IU] | INTRAMUSCULAR | Status: DC | PRN

## 2021-11-01 SURGICAL SUPPLY — 31 items
BLADE LONG MED 31X9 (MISCELLANEOUS) IMPLANT
BNDG ELASTIC 4X5.8 VLCR STR LF (GAUZE/BANDAGES/DRESSINGS) ×1 IMPLANT
BNDG GAUZE ELAST 4 BULKY (GAUZE/BANDAGES/DRESSINGS) ×1 IMPLANT
COVER SURGICAL LIGHT HANDLE (MISCELLANEOUS) ×2 IMPLANT
DRAPE SURG 17X23 STRL (DRAPES) ×2 IMPLANT
GAUZE SPONGE 2X2 8PLY STRL LF (GAUZE/BANDAGES/DRESSINGS) IMPLANT
GAUZE SPONGE 4X4 12PLY STRL (GAUZE/BANDAGES/DRESSINGS) ×1 IMPLANT
GAUZE XEROFORM 1X8 LF (GAUZE/BANDAGES/DRESSINGS) ×1 IMPLANT
GLOVE BIOGEL M 7.0 STRL (GLOVE) ×2 IMPLANT
GLOVE BIOGEL PI ORTHO PRO 7.5 (GLOVE) ×1
GLOVE PI ORTHO PRO STRL 7.5 (GLOVE) ×1 IMPLANT
GOWN STRL REUS W/ TWL LRG LVL3 (GOWN DISPOSABLE) ×2 IMPLANT
GOWN STRL REUS W/TWL LRG LVL3 (GOWN DISPOSABLE) ×2
KIT BASIN OR (CUSTOM PROCEDURE TRAY) ×2 IMPLANT
KIT TURNOVER KIT B (KITS) ×2 IMPLANT
NDL HYPO 25GX1X1/2 BEV (NEEDLE) IMPLANT
NEEDLE HYPO 25GX1X1/2 BEV (NEEDLE) IMPLANT
NS IRRIG 1000ML POUR BTL (IV SOLUTION) ×2 IMPLANT
PACK ORTHO EXTREMITY (CUSTOM PROCEDURE TRAY) ×2 IMPLANT
PAD ARMBOARD 7.5X6 YLW CONV (MISCELLANEOUS) ×4 IMPLANT
SOL PREP POV-IOD 4OZ 10% (MISCELLANEOUS) ×4 IMPLANT
SPECIMEN JAR SMALL (MISCELLANEOUS) ×2 IMPLANT
SPONGE GAUZE 2X2 STER 10/PKG (GAUZE/BANDAGES/DRESSINGS) ×1
STAPLER VISISTAT (STAPLE) ×1 IMPLANT
SUT ETHILON 4 0 PS 2 18 (SUTURE) ×1 IMPLANT
SUT MNCRL AB 4-0 PS2 18 (SUTURE) ×1 IMPLANT
SYR CONTROL 10ML LL (SYRINGE) IMPLANT
TOWEL GREEN STERILE (TOWEL DISPOSABLE) ×2 IMPLANT
TOWEL GREEN STERILE FF (TOWEL DISPOSABLE) ×2 IMPLANT
TUBE CONNECTING 12X1/4 (SUCTIONS) IMPLANT
UNDERPAD 30X36 HEAVY ABSORB (UNDERPADS AND DIAPERS) ×2 IMPLANT

## 2021-11-01 NOTE — Progress Notes (Signed)
Mobility Specialist: Progress Note   11/01/21 1237  Mobility  Activity Ambulated with assistance in hallway  Level of Assistance Contact guard assist, steadying assist  Assistive Device None  Distance Ambulated (ft) 470 ft  Activity Response Tolerated well  $Mobility charge 1 Mobility   During Mobility: 147 HR Post-Mobility: 117 HR  Received pt in bed having no complaints and agreeable to mobility. Asymptomatic throughout ambulation, returned back to bed w/ call bell in reach and all needs met. Bed alarm is on.   [facility] Rox Mcgriff Mobility Specialist Mobility Specialist [address]: [phone]

## 2021-11-01 NOTE — Brief Op Note (Signed)
11/01/2021  6:37 PM  PATIENT:  Audria Nine  71 y.o. female  PRE-OPERATIVE DIAGNOSIS:  gangrene  POST-OPERATIVE DIAGNOSIS:  gangrene  PROCEDURE:  Procedure(s) with comments: AMPUTATION TOE (Left) - will do own block  SURGEON:  Surgeon(s) and Role:    * Ioanna Colquhoun, Stephan Minister, DPM - Primary   ASSISTANTS: none   ANESTHESIA:   local and MAC  EBL:  10 mL   BLOOD ADMINISTERED:none  DRAINS: none   LOCAL MEDICATIONS USED:  MARCAINE    and Amount: 10 ml  SPECIMEN:  3rd toe  DISPOSITION OF SPECIMEN:  PATHOLOGY  COUNTS:  YES  TOURNIQUET:  * No tourniquets in log *  DICTATION: .Note written in EPIC  PLAN OF CARE: Admit to inpatient   PATIENT DISPOSITION:  PACU - hemodynamically stable.   Delay start of Pharmacological VTE agent (>24hrs) due to surgical blood loss or risk of bleeding: no

## 2021-11-01 NOTE — Anesthesia Postprocedure Evaluation (Signed)
Anesthesia Post Note  Patient: Katie Hawkins  Procedure(s) Performed: AMPUTATION TOE (Left: Toe)     Patient location during evaluation: PACU Anesthesia Type: MAC Level of consciousness: awake and alert, patient cooperative and oriented Pain management: pain level controlled Vital Signs Assessment: post-procedure vital signs reviewed and stable Respiratory status: spontaneous breathing, nonlabored ventilation and respiratory function stable Cardiovascular status: stable and blood pressure returned to baseline Postop Assessment: no apparent nausea or vomiting Anesthetic complications: no   No notable events documented.  Last Vitals:  Vitals:   11/01/21 1849 11/01/21 1900  BP: 123/68 100/65  Pulse: (!) 104 (!) 105  Resp: 17 18  Temp: 37.5 C   SpO2: 96% 98%    Last Pain:  Vitals:   11/01/21 1849  TempSrc:   PainSc: 0-No pain                 Glennie Rodda,E. Nashay Brickley

## 2021-11-01 NOTE — Transfer of Care (Signed)
Immediate Anesthesia Transfer of Care Note  Patient: Katie Hawkins  Procedure(s) Performed: AMPUTATION TOE (Left: Toe)  Patient Location: PACU  Anesthesia Type:MAC  Level of Consciousness: awake and alert   Airway & Oxygen Therapy: Patient Spontanous Breathing  Post-op Assessment: Report given to RN and Post -op Vital signs reviewed and stable  Post vital signs: Reviewed and stable  Last Vitals:  Vitals Value Taken Time  BP 123/68 11/01/21 1848  Temp    Pulse 104 11/01/21 1849  Resp 17 11/01/21 1849  SpO2 96 % 11/01/21 1849  Vitals shown include unvalidated device data.  Last Pain:  Vitals:   11/01/21 1633  TempSrc: Oral  PainSc: 0-No pain      Patients Stated Pain Goal: 2 (35/00/93 8182)  Complications: No notable events documented.

## 2021-11-01 NOTE — Progress Notes (Signed)
Orthopedic Tech Progress Note Patient Details:  Katie Hawkins 08/08/1950 [id number]  Ortho Devices Type of Ortho Device: Postop shoe/boot Ortho Device/Splint Location: RLE Ortho Device/Splint Interventions: Ordered     Post op shoe dropped off with PACU RN.  Vernona Rieger 11/01/2021, 8:02 PM

## 2021-11-01 NOTE — Progress Notes (Addendum)
  Progress Note    11/01/2021 8:44 AM 2 Days Post-Op  Subjective:  No complaints this morning   Vitals:   11/01/21 0643 11/01/21 0823  BP: 116/68 139/65  Pulse:  (!) 124  Resp: 12 16  Temp: 98 F (36.7 C) 98.4 F (36.9 C)  SpO2: 95% 99%   Physical Exam: Lungs:  non labored Incisions:  L LE vein harvest incisions c/d/I; prevena L groin with good seal Extremities:  brisk L PT by doppler Neurologic: A&O  CBC    Component Value Date/Time   WBC 18.9 (H) 10/31/2021 0045   RBC 3.61 (L) 10/31/2021 0045   HGB 10.8 (L) 10/31/2021 0045   HGB 13.4 02/20/2014 1159   HCT 32.5 (L) 10/31/2021 0045   HCT 41.0 02/20/2014 1159   PLT 371 10/31/2021 0045   PLT 318 02/20/2014 1159   MCV 90.0 10/31/2021 0045   MCV 89.8 02/20/2014 1159   MCH 29.9 10/31/2021 0045   MCHC 33.2 10/31/2021 0045   RDW 13.4 10/31/2021 0045   RDW 13.6 02/20/2014 1159   LYMPHSABS 1.7 02/20/2014 1159   MONOABS 0.8 02/20/2014 1159   EOSABS 0.1 02/20/2014 1159   BASOSABS 0.1 02/20/2014 1159    BMET    Component Value Date/Time   NA 131 (L) 10/31/2021 0045   NA 140 02/20/2014 1159   K 4.1 10/31/2021 0045   K 3.7 02/20/2014 1159   CL 100 10/31/2021 0045   CL 103 08/11/2012 0838   CO2 22 10/31/2021 0045   CO2 27 02/20/2014 1159   GLUCOSE 222 (H) 10/31/2021 0045   GLUCOSE 176 (H) 02/20/2014 1159   GLUCOSE 165 (H) 08/11/2012 0838   BUN 8 10/31/2021 0045   BUN 16.6 02/20/2014 1159   CREATININE 0.60 10/31/2021 0045   CREATININE 0.7 02/20/2014 1159   CALCIUM 8.9 10/31/2021 0045   CALCIUM 10.0 02/20/2014 1159   GFRNONAA >60 10/31/2021 0045   GFRAA >60 11/22/2010 1515    INR    Component Value Date/Time   INR 1.0 10/23/2021 1005    No intake or output data in the 24 hours ending 11/01/21 0844   Assessment/Plan:  71 y.o. female is s/p L femoral to peroneal bypass with vein 2 Days Post-Op   L foot well perfused based on doppler exam Continue prevena L groin Plans noted for L 3rd toe amputation  with Podiatry this evening Possible discharge home tomorrow if ambulating well and pain controlled after toe amp    Dagoberto Ligas, PA-C [facility] [phone] 11/01/2021 8:44 AM  I have independently interviewed and examined patient and agree with PA assessment and plan above.   Veronica Guerrant C. Donzetta Matters, MD [facility] [place] Office: [phone] Pager: [phone]

## 2021-11-01 NOTE — Anesthesia Preprocedure Evaluation (Addendum)
Anesthesia Evaluation  Patient identified by MRN, date of birth, ID band Patient awake    Reviewed: Allergy & Precautions, NPO status , Patient's Chart, lab work & pertinent test results  Airway Mallampati: II  TM Distance: >3 FB     Dental   Pulmonary neg pulmonary ROS,    breath sounds clear to auscultation       Cardiovascular hypertension, Pt. 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   Abdominal   Peds  Hematology  (+) Blood dyscrasia, anemia , Hx/o Hodgkin's lymphoma   Anesthesia Other Findings   Reproductive/Obstetrics                            Anesthesia Physical Anesthesia Plan  ASA: 3  Anesthesia Plan: MAC   Post-op Pain Management: Regional block* and Minimal or no pain anticipated   Induction: Intravenous  PONV Risk Score and Plan: 2 and Treatment may vary due to age or medical condition, Ondansetron and Propofol infusion  Airway Management Planned: Natural Airway, Nasal Cannula and Simple Face Mask  Additional Equipment:   Intra-op Plan:   Post-operative Plan:   Informed Consent: I have reviewed the patients History and Physical, chart, labs and discussed the procedure including the risks, benefits and alternatives for the proposed anesthesia with the patient or authorized representative who has indicated his/her understanding and acceptance.     Dental advisory given  Plan Discussed with: CRNA and Anesthesiologist  Anesthesia Plan Comments:        Anesthesia Quick Evaluation

## 2021-11-01 NOTE — Progress Notes (Signed)
History and Physical Interval Note:  11/01/2021 5:56 PM  Katie Hawkins  has presented today for surgery, with the diagnosis of gangrene left 3rd toe.  The various methods of treatment have been discussed with the patient and family. After consideration of risks, benefits and other options for treatment, the patient has consented to   Procedure(s) with comments: AMPUTATION TOE (Left) - will do own block as a surgical intervention.  The patient's history has been reviewed, patient examined, no change in status, stable for surgery.  I have reviewed the patient's chart and labs.  Questions were answered to the patient's satisfaction.     [name]

## 2021-11-01 NOTE — TOC Benefit Eligibility Note (Signed)
Patient Teacher, English as a foreign language completed.    The patient is currently admitted and upon discharge could be taking rosuvastatin (Crestor) 20 mg tablets.  The current 30 day co-pay is, $0.00.   The patient is insured through [place], [place] Patient Advocate Specialist [place] Patient Advocate Team Direct Number: [phone]  Fax: [phone]

## 2021-11-01 NOTE — Care Management Important Message (Signed)
Important Message  Patient Details  Name: Mana Haberl MRN: [id number] Date of Birth: August 15, 1950   Medicare Important Message Given:  Yes     Shelda Altes 11/01/2021, 9:18 AM

## 2021-11-01 NOTE — Anesthesia Procedure Notes (Signed)
Procedure Name: MAC Date/Time: 11/01/2021 6:13 PM  Performed by: Reece Agar, CRNAPre-anesthesia Checklist: Patient identified, Emergency Drugs available, Suction available and Patient being monitored Patient Re-evaluated:Patient Re-evaluated prior to induction Oxygen Delivery Method: Simple face mask

## 2021-11-02 ENCOUNTER — Encounter (HOSPITAL_COMMUNITY): Payer: Self-pay | Admitting: Podiatry

## 2021-11-02 LAB — GLUCOSE, CAPILLARY
Glucose-Capillary: 177 mg/dL — ABNORMAL HIGH (ref 70–99)
Glucose-Capillary: 185 mg/dL — ABNORMAL HIGH (ref 70–99)

## 2021-11-02 MED ORDER — HYDROCODONE-ACETAMINOPHEN 5-325 MG PO TABS: 1.0000 | ORAL_TABLET | Freq: Four times a day (QID) | ORAL | 0 refills | Status: DC | PRN

## 2021-11-02 MED ORDER — DOXYCYCLINE HYCLATE 50 MG PO CAPS: 50.0000 mg | ORAL_CAPSULE | Freq: Two times a day (BID) | ORAL | 0 refills | Status: DC

## 2021-11-02 NOTE — Progress Notes (Addendum)
[facility] [place]  Subjective  - Pain is much better now that the toe has been amputated   Objective 128/77 97 98.1 F (36.7 C) (Oral) 19 95%  Intake/Output Summary (Last 24 hours) at 11/02/2021 0741 Last data filed at 11/02/2021 0254 Gross per 24 hour  Intake 1515.37 ml  Output 10 ml  Net 1505.37 ml    Left foot dressing clean and dry Left groin soft, incisional vac in place with good suction, lower leg incision healing well Palpable PT pulse left LE Lungs non labored breathing   Assessment/Planning: 71 y.o. female is s/p  #1: Left common femoral to peroneal artery bypass graft with ipsilateral nonreversed translocated saphenous vein #2: Left groin Prevena wound VAC for ischemic rest pain.  Improved inflow to assist with amputation site healing and to improve lifestyle Wound vac will be maintained for 7-10 days and then can be discarded.  DPM s/p amputation left third toe Plan for 2 weeks of Doxycycline orally Stable for discharge Heel weight bearing in darco shoe, elevation when at rest for edema F/U with VVS 2-3 weeks for incisional checks and baseline ABI  Roxy Horseman 11/02/2021 7:41 AM --  Laboratory Lab Results: Recent Labs    10/31/21 0045  WBC 18.9*  HGB 10.8*  HCT 32.5*  PLT 371   BMET Recent Labs    10/31/21 0045  NA 131*  K 4.1  CL 100  CO2 22  GLUCOSE 222*  BUN 8  CREATININE 0.60  CALCIUM 8.9    COAG Lab Results  Component Value Date   INR 1.0 10/23/2021   No results found for: "PTT"

## 2021-11-02 NOTE — Plan of Care (Signed)
  Problem: Skin Integrity: Goal: Risk for impaired skin integrity will decrease Outcome: Progressing   

## 2021-11-02 NOTE — Progress Notes (Signed)
Patient D/C home with preveena wound vac, D/C instructions, transported by family.

## 2021-11-02 NOTE — Plan of Care (Signed)
  Problem: Education: Goal: Ability to describe self-care measures that may prevent or decrease complications (Diabetes Survival Skills Education) will improve Outcome: Adequate for Discharge Goal: Individualized Educational Video(s) Outcome: Adequate for Discharge   Problem: Coping: Goal: Ability to adjust to condition or change in health will improve Outcome: Adequate for Discharge   Problem: Fluid Volume: Goal: Ability to maintain a balanced intake and output will improve Outcome: Adequate for Discharge   Problem: Health Behavior/Discharge Planning: Goal: Ability to identify and utilize available resources and services will improve Outcome: Adequate for Discharge Goal: Ability to manage health-related needs will improve Outcome: Adequate for Discharge   Problem: Nutritional: Goal: Maintenance of adequate nutrition will improve Outcome: Adequate for Discharge Goal: Progress toward achieving an optimal weight will improve Outcome: Adequate for Discharge   Problem: Skin Integrity: Goal: Risk for impaired skin integrity will decrease Outcome: Adequate for Discharge   Problem: Tissue Perfusion: Goal: Adequacy of tissue perfusion will improve Outcome: Adequate for Discharge   Problem: Education: Goal: Knowledge of General Education information will improve Description: Including pain rating scale, medication(s)/side effects and non-pharmacologic comfort measures Outcome: Adequate for Discharge   Problem: Nutrition: Goal: Adequate nutrition will be maintained Outcome: Adequate for Discharge   Problem: Coping: Goal: Level of anxiety will decrease Outcome: Adequate for Discharge   Problem: Elimination: Goal: Will not experience complications related to bowel motility Outcome: Adequate for Discharge Goal: Will not experience complications related to urinary retention Outcome: Adequate for Discharge

## 2021-11-03 DIAGNOSIS — I96 Gangrene, not elsewhere classified: Secondary | ICD-10-CM

## 2021-11-03 NOTE — Op Note (Signed)
Patient Name: Katie Hawkins DOB: [birth year]  MRN: [id number]   Date of Service: 10/30/2021 - 11/01/2021  Surgeon: Dr. Lanae Crumbly, DPM Assistants: None Pre-operative Diagnosis:  Gangrene left third toe Post-operative Diagnosis:  Gangrene left third toe Procedures:  1) amputation MPJ left third toe Pathology/Specimens: ID Type Source Tests Collected by Time Destination  1 : 3rd left toe Tissue PATH GI Other SURGICAL PATHOLOGY Katie Hawkins, [facility] 11/01/2021 1827    Anesthesia: Monitored anesthesia care with local block Hemostasis: * No tourniquets in log * Estimated Blood Loss: 10 mL Materials: * No implants in log * Medications: 10 cc 0.5% Marcaine plain Complications: No complications noted  Indications for Procedure:  This is a 71 y.o. female with a history of type 2 diabetes and peripheral arterial disease.  She developed gangrene of the left third toe.  She previously underwent vascular bypass with vascular surgery.  Amputation was recommended for the gangrene.  All questions were addressed prior to surgery.  Informed consent was signed and reviewed prior to surgery.   Procedure in Detail: Patient was identified in pre-operative holding area. Formal consent was signed and the left lower extremity was marked. Patient was brought back to the operating room. Anesthesia was induced. The extremity was prepped and draped in the usual sterile fashion. Timeout was taken to confirm patient name, laterality, and procedure prior to incision.   Attention was then directed to the third toe where a racquet shaped incision was made around the third toe proximally.  It was carried deep to the level of bone and then to the MTPJ.  The soft tissue attachments of the proximal phalanx to the metatarsal were freed and the toe was amputated.  The remaining metatarsal bone appeared healthy and viable.  Perfusion was excellent.  The wound was thoroughly irrigated and then closed with 3-0 nylon and skin  staples  The foot was then dressed with Xeroform and dry sterile dressings. Patient tolerated the procedure well.   Disposition: Following a period of post-operative monitoring, patient will be transferred to the floor.

## 2021-11-04 ENCOUNTER — Telehealth: Payer: Self-pay

## 2021-11-05 ENCOUNTER — Telehealth: Payer: Self-pay | Admitting: Podiatry

## 2021-11-05 LAB — SURGICAL PATHOLOGY

## 2021-11-05 NOTE — Telephone Encounter (Signed)
Pt called today to get scheduled for next week for the hospital follow up from the toe amputation. I have scheduled her for 6.20 at 1015 but she has an appt with the eye doctor for discussion of cataract surgery.  She is not sure of the time of the eye doctor appt. But she is going to call to see if she can change but you are very full the next few weeks. I did  add her to waitlist as well. She just wanted me to make you aware.

## 2021-11-05 NOTE — Discharge Summary (Signed)
[facility] Discharge Summary   Patient ID:  Katie Hawkins MRN: [id number] DOB/AGE: 01-05-1951 71 y.o.  Admit date: 10/30/2021 Discharge date: 11/02/21 Date of Surgery: 11/01/2021 Surgeon: Surgeon(s): [name], DPM  Admission Diagnosis: Peripheral arterial occlusive disease ([place]) [I77.9]  Discharge Diagnoses:  Peripheral arterial occlusive disease ([place]) [I77.9]  Secondary Diagnoses: Past Medical History:  Diagnosis Date   Diabetes mellitus    History of blood transfusion    HTN (hypertension), benign 02/13/2012   Hypercholesterolemia    Hyperlipidemia    Hypertension    nodular lymphocyte predominate hodgkins lymphoma dx'd 10/2010   xrt comp 02/20/11   Peripheral vascular disease ([place])     Procedure(s): AMPUTATION TOE  Discharged Condition: good  HPI: This is a 71 year old female with left third toe gangrene.  Angiography reveals occluded superficial femoral-popliteal artery with single-vessel runoff via the peroneal artery.   Hospital Course:  Katie Hawkins is a 71 y.o. female is S/P Left Procedure(s): #1: Left common femoral to peroneal artery bypass graft with ipsilateral nonreversed translocated saphenous vein   #2: Left groin Prevena wound VAC                      AMPUTATION TOE by Dr. Sherryle Lis DPM  Post op she had good inflow with palpable PT pulse left LE.  Incision healing well, incisional vac was maintained over the groin incision.   11/01/21 third toe amputation.  Foot dressing was maintained and followed by DPM. Palpable PT pulses with improved rest pain.  The vac will be maintained for 7-10 days.  She will f/u for incision checks in 2-3 weeks.  Plan for 2 weeks of Doxycycline orally.  Heel weight bearing Darco shoe.   Significant Diagnostic Studies: CBC Lab Results  Component Value Date   WBC 18.9 (H) 10/31/2021   HGB 10.8 (L) 10/31/2021   HCT 32.5 (L) 10/31/2021   MCV 90.0 10/31/2021   PLT 371 10/31/2021    BMET     Component Value Date/Time   NA 131 (L) 10/31/2021 0045   NA 140 02/20/2014 1159   K 4.1 10/31/2021 0045   K 3.7 02/20/2014 1159   CL 100 10/31/2021 0045   CL 103 08/11/2012 0838   CO2 22 10/31/2021 0045   CO2 27 02/20/2014 1159   GLUCOSE 222 (H) 10/31/2021 0045   GLUCOSE 176 (H) 02/20/2014 1159   GLUCOSE 165 (H) 08/11/2012 0838   BUN 8 10/31/2021 0045   BUN 16.6 02/20/2014 1159   CREATININE 0.60 10/31/2021 0045   CREATININE 0.7 02/20/2014 1159   CALCIUM 8.9 10/31/2021 0045   CALCIUM 10.0 02/20/2014 1159   GFRNONAA >60 10/31/2021 0045   GFRAA >60 11/22/2010 1515   COAG Lab Results  Component Value Date   INR 1.0 10/23/2021     Disposition:  Discharge to :Home Discharge Instructions     Call MD for:  redness, tenderness, or signs of infection (pain, swelling, bleeding, redness, odor or green/yellow discharge around incision site)   Complete by: As directed    Call MD for:  severe or increased pain, loss or decreased feeling  in affected limb(s)   Complete by: As directed    Call MD for:  temperature >100.5   Complete by: As directed    Resume previous diet   Complete by: As directed       Allergies as of 11/02/2021       Reactions   Linagliptin    didn't like how  she felt on this        Medication List     TAKE these medications    Accu-Chek FastClix Lancets Misc   acetaminophen 500 MG tablet Commonly known as: TYLENOL Take 1,000 mg by mouth at bedtime as needed for moderate pain.   aspirin EC 81 MG tablet Take 1 tablet (81 mg total) by mouth daily. Swallow whole.   doxycycline 50 MG capsule Commonly known as: VIBRAMYCIN Take 1 capsule (50 mg total) by mouth 2 (two) times daily.   ezetimibe 10 MG tablet Commonly known as: ZETIA Take 10 mg by mouth daily.   glimepiride 2 MG tablet Commonly known as: AMARYL Take 2 mg by mouth at bedtime.   glucose blood test strip 1 each by Other route as needed. Use as instructed. One Touch Delica Lancets  used also.   HYDROcodone-acetaminophen 5-325 MG tablet Commonly known as: NORCO/VICODIN Take 1 tablet by mouth every 6 (six) hours as needed for moderate pain. What changed: when to take this   lisinopril 10 MG tablet Commonly known as: ZESTRIL Take 10 mg by mouth daily.   metFORMIN 500 MG 24 hr tablet Commonly known as: GLUCOPHAGE-XR Take 500 mg by mouth 2 (two) times daily.   metoprolol succinate 25 MG 24 hr tablet Commonly known as: TOPROL-XL Take 25 mg by mouth daily.   MULTIVITAMIN GUMMIES ADULT PO Take 2 tablets by mouth daily.   pravastatin 40 MG tablet Commonly known as: PRAVACHOL Take 40 mg by mouth Daily.   sulfamethoxazole-trimethoprim 800-160 MG tablet Commonly known as: BACTRIM DS Take 1 tablet by mouth 2 (two) times daily.       Verbal and written Discharge instructions given to the patient. Wound care per Discharge AVS  Follow-up Information     [facility] Follow up in 3 week(s).   Specialty: Vascular Surgery Why: sent Contact information: [place] [address] [phone]                Signed: Roxy Horseman 11/05/2021, 8:20 AM - For VQI Registry use --- Instructions: Press F2 to tab through selections.  Delete question if not applicable.   Post-op:  Wound infection: Yes Third left toe Graft infection: No  Transfusion: No  If yes, 0 units given New Arrhythmia: No Ipsilateral amputation: [ x] no, '[ ]'$  Minor, '[ ]'$  BKA, '[ ]'$  AKA Discharge patency: [ x] Primary, '[ ]'$  Primary assisted, '[ ]'$  Secondary, '[ ]'$  Occluded Patency judged by: '[ ]'$  Dopper only, '[ ]'$  Palpable graft pulse, [ x] Palpable distal pulse, '[ ]'$  ABI inc. > 0.15, '[ ]'$  Duplex   Complications: MI: [ x] No, '[ ]'$  Troponin only, '[ ]'$  EKG or Clinical CHF: No Resp failure: [x ] none, '[ ]'$  Pneumonia, '[ ]'$  Ventilator Chg in renal function: [x ] none, '[ ]'$  Inc. Cr > 0.5, '[ ]'$  Temp. Dialysis, '[ ]'$  Permanent dialysis Stroke: [ x]  None, '[ ]'$  Minor, '[ ]'$  Major Return to OR: Yes By [facility] for toe amputation Reason for return to OR: '[ ]'$  Bleeding, '[ ]'$  Infection, '[ ]'$  Thrombosis, '[ ]'$  Revision  Discharge medications: Statin use:  Yes ASA use:  Yes Plavix use:  NO not indicated Beta blocker use: Yes Coumadin use: No  for medical reason not indicated

## 2021-11-12 ENCOUNTER — Ambulatory Visit: Payer: Medicare HMO | Admitting: Podiatry

## 2021-11-12 DIAGNOSIS — Z89422 Acquired absence of other left toe(s): Secondary | ICD-10-CM

## 2021-11-12 DIAGNOSIS — I739 Peripheral vascular disease, unspecified: Secondary | ICD-10-CM | POA: Diagnosis not present

## 2021-11-12 NOTE — Progress Notes (Signed)
  Subjective:  Patient ID: Katie Hawkins, female    DOB: September 18, 1950,  MRN: [id number]  Chief Complaint  Patient presents with   Routine Post Op    POV DOS 11/01/21. Left foot 3nd toe amputation.  Staple and sutures are still in place. No nausea, vomiting and or fever.     71 y.o. female returns for post-op check.  Doing well her pain has resolved nearly completely after the amputation  Review of Systems: Negative except as noted in the HPI. Denies N/V/F/Ch.   Objective:  There were no vitals filed for this visit. There is no height or weight on file to calculate BMI. Constitutional Well developed. Well nourished.  Vascular Foot warm and well perfused. Capillary refill normal to all digits.  Calf is soft and supple, no posterior calf or knee pain, negative Homans' sign  Neurologic Normal speech. Oriented to person, place, and time. Epicritic sensation to light touch grossly present bilaterally.  Dermatologic Skin healing well without signs of infection. Skin edges well coapted without signs of infection.  Orthopedic: Tenderness to palpation noted about the surgical site.   Assessment:   1. Status post amputation of lesser toe of left foot ([place])   2. Peripheral arterial disease ([place])    Plan:  Patient was evaluated and treated and all questions answered.  S/p foot surgery left -Progressing as expected post-operatively. -WB Status: WBAT in surgical shoe -Sutures: Plan to remove in 1 week. -Medications: No refills required complete current antibiotics -Foot redressed.  Return in about 1 week (around 11/19/2021) for post op (no x-rays).

## 2021-11-18 ENCOUNTER — Encounter: Payer: Self-pay | Admitting: Surgery

## 2021-11-18 ENCOUNTER — Ambulatory Visit (INDEPENDENT_AMBULATORY_CARE_PROVIDER_SITE_OTHER): Payer: Medicare HMO | Admitting: Surgery

## 2021-11-18 VITALS — BP 149/73 | HR 115 | Temp 98.0°F | Resp 14 | Ht 63.0 in | Wt 175.0 lb

## 2021-11-18 DIAGNOSIS — I70262 Atherosclerosis of native arteries of extremities with gangrene, left leg: Secondary | ICD-10-CM

## 2021-11-18 NOTE — Progress Notes (Signed)
Patient name: Katie Hawkins MRN: [id number] DOB: Feb 19, 1951 Sex: female  REASON FOR VISIT:     Post op  HISTORY OF PRESENT ILLNESS:   Katie Hawkins is a 71 y.o. female who was initially evaluated by Dr. Lenell Antu on 10/15/2021 with a left toe gangrene.  She underwent angiography on 10/17/2021 and was found to have an occluded superficial femoral and popliteal artery with single-vessel runoff via the peroneal.  On 10/30/2021 I performed left common femoral to peroneal artery bypass graft with saphenous vein.  On 11/02/2021, Dr. Lilian Kapur with podiatry performed left third toe amputation.  She is ambulating without difficulty.  She does complain of some swelling in her left leg particularly during the day.  This goes away at night  CURRENT MEDICATIONS:    Current Outpatient Medications  Medication Sig Dispense Refill   Accu-Chek FastClix Lancets MISC      acetaminophen (TYLENOL) 500 MG tablet Take 1,000 mg by mouth at bedtime as needed for moderate pain.     aspirin EC 81 MG tablet Take 1 tablet (81 mg total) by mouth daily. Swallow whole. 150 tablet 2   doxycycline (VIBRAMYCIN) 50 MG capsule Take 1 capsule (50 mg total) by mouth 2 (two) times daily. 14 capsule 0   ezetimibe (ZETIA) 10 MG tablet Take 10 mg by mouth daily.     glimepiride (AMARYL) 2 MG tablet Take 2 mg by mouth at bedtime.     glucose blood test strip 1 each by Other route as needed. Use as instructed. One Touch Delica Lancets used also.      lisinopril (PRINIVIL,ZESTRIL) 10 MG tablet Take 10 mg by mouth daily.       metFORMIN (GLUCOPHAGE-XR) 500 MG 24 hr tablet Take 500 mg by mouth 2 (two) times daily.     metoprolol succinate (TOPROL-XL) 25 MG 24 hr tablet Take 25 mg by mouth daily.  0   Multiple Vitamins-Minerals (MULTIVITAMIN GUMMIES ADULT PO) Take 2 tablets by mouth daily.     pravastatin (PRAVACHOL) 40 MG tablet Take 40 mg by mouth Daily.      sulfamethoxazole-trimethoprim (BACTRIM DS)  800-160 MG tablet Take 1 tablet by mouth 2 (two) times daily. 14 tablet 0   HYDROcodone-acetaminophen (NORCO/VICODIN) 5-325 MG tablet Take 1 tablet by mouth every 6 (six) hours as needed for moderate pain. (Patient not taking: Reported on 11/18/2021) 20 tablet 0   No current facility-administered medications for this visit.    REVIEW OF SYSTEMS:   [X]  denotes positive finding, [ ]  denotes negative finding Cardiac  Comments:  Chest pain or chest pressure:    Shortness of breath upon exertion:    Short of breath when lying flat:    Irregular heart rhythm:    Constitutional    Fever or chills:      PHYSICAL EXAM:   Vitals:   11/18/21 0815  BP: (!) 149/73  Pulse: (!) 115  Resp: 14  Temp: 98 F (36.7 C)  TempSrc: Temporal  SpO2: 95%  Weight: 175 lb (79.4 kg)  Height: 5\' 3"  (1.6 m)    GENERAL: The patient is a well-nourished female, in no acute distress. The vital signs are documented above. CARDIOVASCULAR: There is a regular rate and rhythm. PULMONARY: Non-labored respirations Incisions are healing nicely Brisk PT signal at the ankle and peroneal signal    STUDIES:   none   MEDICAL ISSUES:   Status post left femoral peroneal bypass graft with vein for left third toe gangrene.  She has  had her toe amputated by podiatry.  She has brisk pedal Doppler signals.  Her wound is healing.  She will need surveillance imaging of her bypass graft in 3 months.  Also told her she could start wearing a compression sock in 1 month to help with her leg swelling.  She will continue aspirin and statin.  Charlena Cross, MD, FACS [facility] [phone] Pager [phone]

## 2021-11-20 ENCOUNTER — Ambulatory Visit (INDEPENDENT_AMBULATORY_CARE_PROVIDER_SITE_OTHER): Payer: Medicare HMO

## 2021-11-20 DIAGNOSIS — Z89422 Acquired absence of other left toe(s): Secondary | ICD-10-CM

## 2021-11-20 MED ORDER — MUPIROCIN 2 % EX OINT: TOPICAL_OINTMENT | Freq: Every day | CUTANEOUS | Status: DC

## 2021-11-20 NOTE — Progress Notes (Signed)
Patient seen in office for post op visit #2 Left foot 3rd toe amputation. Patient denies any nausea, vomiting, fever, chills. Stiches and staples were removed today. Patient tolerated procedure with no complications. Patient was seen by Dr. Sherryle Lis today. Per provider orders nurse applied Iodosorb followed by an adhesive bandage. Patient was advised to cleanse area with water while showering but do not soak foot and apply mupirocin ointment followed by a Band-Aid once a day.    All questions were answered at this visit.   Advised patient to contact office with any concerns. If any signs or symptoms of infection occur to go to the ED immediately or to contact our office.

## 2021-11-22 ENCOUNTER — Telehealth: Payer: Self-pay | Admitting: Podiatry

## 2021-11-22 MED ORDER — MUPIROCIN 2 % EX OINT: 1.0000 | TOPICAL_OINTMENT | Freq: Every day | CUTANEOUS | 2 refills | Status: DC

## 2021-11-22 NOTE — Telephone Encounter (Signed)
Pt was seen in the office on 7/28; she states she waiting on an ointment RX to be called in to the [facility], [place], [place]. [address] Please advise.

## 2021-11-27 ENCOUNTER — Other Ambulatory Visit: Payer: Self-pay

## 2021-11-27 DIAGNOSIS — I70262 Atherosclerosis of native arteries of extremities with gangrene, left leg: Secondary | ICD-10-CM

## 2021-11-28 NOTE — Telephone Encounter (Signed)
Appt scheduled

## 2021-12-09 ENCOUNTER — Ambulatory Visit (INDEPENDENT_AMBULATORY_CARE_PROVIDER_SITE_OTHER): Payer: Medicare HMO | Admitting: Podiatry

## 2021-12-09 DIAGNOSIS — L97522 Non-pressure chronic ulcer of other part of left foot with fat layer exposed: Secondary | ICD-10-CM | POA: Diagnosis not present

## 2021-12-09 DIAGNOSIS — Z89422 Acquired absence of other left toe(s): Secondary | ICD-10-CM

## 2021-12-10 DIAGNOSIS — I739 Peripheral vascular disease, unspecified: Secondary | ICD-10-CM | POA: Diagnosis not present

## 2021-12-10 DIAGNOSIS — Z0000 Encounter for general adult medical examination without abnormal findings: Secondary | ICD-10-CM | POA: Diagnosis not present

## 2021-12-10 DIAGNOSIS — Z8571 Personal history of Hodgkin lymphoma: Secondary | ICD-10-CM | POA: Diagnosis not present

## 2021-12-10 DIAGNOSIS — E669 Obesity, unspecified: Secondary | ICD-10-CM | POA: Diagnosis not present

## 2021-12-10 DIAGNOSIS — E1159 Type 2 diabetes mellitus with other circulatory complications: Secondary | ICD-10-CM | POA: Diagnosis not present

## 2021-12-10 DIAGNOSIS — E1169 Type 2 diabetes mellitus with other specified complication: Secondary | ICD-10-CM | POA: Diagnosis not present

## 2021-12-10 DIAGNOSIS — Z1331 Encounter for screening for depression: Secondary | ICD-10-CM | POA: Diagnosis not present

## 2021-12-10 DIAGNOSIS — I10 Essential (primary) hypertension: Secondary | ICD-10-CM | POA: Diagnosis not present

## 2021-12-10 DIAGNOSIS — E7800 Pure hypercholesterolemia, unspecified: Secondary | ICD-10-CM | POA: Diagnosis not present

## 2021-12-12 ENCOUNTER — Encounter: Payer: Self-pay | Admitting: Podiatry

## 2021-12-12 NOTE — Progress Notes (Signed)
  Subjective:  Patient ID: Katie Hawkins, female    DOB: July 21, 1950,  MRN: [id number]  Chief Complaint  Patient presents with   Routine Post Op      2 pov left toe amputation/ dos 6.9.2023    71 y.o. female returns for post-op check.  Doing well she has been applying a bandage to the wound  Review of Systems: Negative except as noted in the HPI. Denies N/V/F/Ch.   Objective:  There were no vitals filed for this visit. There is no height or weight on file to calculate BMI. Constitutional Well developed. Well nourished.  Vascular Foot warm and well perfused. Capillary refill normal to all digits.  Calf is soft and supple, no posterior calf or knee pain, negative Homans' sign  Neurologic Normal speech. Oriented to person, place, and time. Epicritic sensation to light touch grossly present bilaterally.  Dermatologic Central portions of the incision has slight skin breakdown with an open ulceration with exposed subcutaneous tissue measuring 0.5 x 0.2 x 0.2 cm.  No signs of infection.  Orthopedic: She has now tenderness to palpation noted about the surgical site.     Assessment:   1. Ulcerated, foot, left, with fat layer exposed ([place])   2. Status post amputation of lesser toe of left foot ([place])    Plan:  Patient was evaluated and treated and all questions answered.  S/p foot surgery left -There is dehiscence and ulceration at the amputation site.  The wound was debrided of nonviable skin and subcutaneous tissue excisionally with a sharp scalpel.  Postdebridement measurements are 0.5 x 0.2 x 8.2 cm.  There were no signs of infection.  She will dress daily at home with mupirocin ointment.  I will see her back in 3 weeks for further wound care.  Return in about 3 weeks (around 12/30/2021) for wound care.

## 2021-12-16 ENCOUNTER — Telehealth: Payer: Self-pay

## 2021-12-16 NOTE — Telephone Encounter (Signed)
Pt called stating that she had a sore and red area on the top of her foot, possibly caused by the strap of her flip flops.  Reviewed pt's chart, returned pt's call for clarification, two identifiers used. Pt stated that the area appeared on Friday after she'd been walking and on her feet the majority of the day. She feels that it may be a superficial irritation. She denies any pain, coldness, numbness, or open wounds on the leg or foot. Pt instructed to call podiatry and contact our office if they feel it is a vascular issue. Confirmed understanding.

## 2021-12-23 DIAGNOSIS — E7800 Pure hypercholesterolemia, unspecified: Secondary | ICD-10-CM | POA: Diagnosis not present

## 2021-12-23 DIAGNOSIS — E1159 Type 2 diabetes mellitus with other circulatory complications: Secondary | ICD-10-CM | POA: Diagnosis not present

## 2021-12-23 DIAGNOSIS — I10 Essential (primary) hypertension: Secondary | ICD-10-CM | POA: Diagnosis not present

## 2021-12-25 ENCOUNTER — Ambulatory Visit: Payer: Medicare HMO | Admitting: Podiatry

## 2021-12-25 DIAGNOSIS — S90822A Blister (nonthermal), left foot, initial encounter: Secondary | ICD-10-CM | POA: Diagnosis not present

## 2021-12-25 DIAGNOSIS — L97522 Non-pressure chronic ulcer of other part of left foot with fat layer exposed: Secondary | ICD-10-CM

## 2021-12-25 NOTE — Progress Notes (Signed)
  Subjective:  Patient ID: Katie Hawkins, female    DOB: [birth year],  MRN: [id number]  Chief Complaint  Patient presents with   Blister      Left foot - new blisters on top of foot, very sore   Routine Post Op    Status post left third toe amputation, incision is still healing, looks better    71 y.o. female returns for post-op check.  The wound is doing well she has a new issue of a blister on the top of the left foot she thinks it started after a certain pair of leather sandals was worn across the top  Review of Systems: Negative except as noted in the HPI. Denies N/V/F/Ch.   Objective:  There were no vitals filed for this visit. There is no height or weight on file to calculate BMI. Constitutional Well developed. Well nourished.  Vascular Foot warm and well perfused. Capillary refill normal to all digits.  Calf is soft and supple, no posterior calf or knee pain, negative Homans' sign  Neurologic Normal speech. Oriented to person, place, and time. Epicritic sensation to light touch grossly present bilaterally.  Dermatologic Central portions of the incision has slight skin breakdown with an open ulceration with exposed subcutaneous tissue measuring 0.3 x 0.2 x 0.5 cm.  New blister and abrasion on the dorsal.  No signs of infection.  Orthopedic: She has now tenderness to palpation noted about the surgical site.     Assessment:   1. Blister of left foot, initial encounter   2. Ulcerated, foot, left, with fat layer exposed ([place])    Plan:  Patient was evaluated and treated and all questions answered.  S/p foot surgery left - The full-thickness left foot wound was debrided of nonviable skin and subcutaneous tissue excisionally with a sharp scalpel.  Postdebridement measurements are 0.3 x 0.2 x 0.5 cm there were no signs of infection.  Prisma collagen matrix applied.  She will use this at home every 2 to 3 days.   Regarding a blister on the left foot there is no signs of  infection and appears to be healing well albeit slowly.  Advised to avoid shoe gear she will wear nonadhesive padded bandage here to offload pressure.  She will apply mupirocin ointment daily.  We will reevaluate her in 2 weeks  No follow-ups on file.

## 2022-01-06 ENCOUNTER — Ambulatory Visit (INDEPENDENT_AMBULATORY_CARE_PROVIDER_SITE_OTHER): Payer: Medicare HMO | Admitting: Podiatry

## 2022-01-06 ENCOUNTER — Ambulatory Visit: Payer: Medicare HMO

## 2022-01-06 DIAGNOSIS — S90822A Blister (nonthermal), left foot, initial encounter: Secondary | ICD-10-CM | POA: Diagnosis not present

## 2022-01-06 DIAGNOSIS — Z9889 Other specified postprocedural states: Secondary | ICD-10-CM

## 2022-01-06 DIAGNOSIS — L97522 Non-pressure chronic ulcer of other part of left foot with fat layer exposed: Secondary | ICD-10-CM

## 2022-01-07 NOTE — Progress Notes (Signed)
  Subjective:  Patient ID: Katie Hawkins, female    DOB: 09-Sep-1950,  MRN: [id number]  Chief Complaint  Patient presents with   Routine Post Op     left toe amputation, no pain, numbness in the left foot    71 y.o. female returns for post-op check.  Says both are doing better  Review of Systems: Negative except as noted in the HPI. Denies N/V/F/Ch.   Objective:  There were no vitals filed for this visit. There is no height or weight on file to calculate BMI. Constitutional Well developed. Well nourished.  Vascular Foot warm and well perfused. Capillary refill normal to all digits.  Calf is soft and supple, no posterior calf or knee pain, negative Homans' sign  Neurologic Normal speech. Oriented to person, place, and time. Epicritic sensation to light touch grossly present bilaterally.  Dermatologic Central portions of the incision has slight skin breakdown with an open ulceration with exposed subcutaneous tissue measuring 0.2 x 0.2 x 0.2 cm.  Very professional now.  Blister is healing well  Orthopedic: She has now tenderness to palpation noted about the surgical site.     Assessment:   1. Ulcerated, foot, left, with fat layer exposed ([place])   2. Blister of left foot, initial encounter    Plan:  Patient was evaluated and treated and all questions answered.  S/p foot surgery left -Ulcer patient is nearly fully healed.  She may continue to apply mupirocin ointment to this without bandage and leave OTA   Blister is also healing well and she may leave OTA with mupirocin, when in close shoes may want to wear bandage for padding.  I will see her back in 4 weeks for final wound check.  Return in about 4 weeks (around 02/03/2022) for wound check .

## 2022-01-15 DIAGNOSIS — Z1231 Encounter for screening mammogram for malignant neoplasm of breast: Secondary | ICD-10-CM | POA: Diagnosis not present

## 2022-01-16 DIAGNOSIS — H2513 Age-related nuclear cataract, bilateral: Secondary | ICD-10-CM | POA: Diagnosis not present

## 2022-01-16 DIAGNOSIS — H25043 Posterior subcapsular polar age-related cataract, bilateral: Secondary | ICD-10-CM | POA: Diagnosis not present

## 2022-01-16 DIAGNOSIS — H18413 Arcus senilis, bilateral: Secondary | ICD-10-CM | POA: Diagnosis not present

## 2022-01-16 DIAGNOSIS — H2511 Age-related nuclear cataract, right eye: Secondary | ICD-10-CM | POA: Diagnosis not present

## 2022-01-16 DIAGNOSIS — H35373 Puckering of macula, bilateral: Secondary | ICD-10-CM | POA: Diagnosis not present

## 2022-01-16 DIAGNOSIS — H25013 Cortical age-related cataract, bilateral: Secondary | ICD-10-CM | POA: Diagnosis not present

## 2022-01-23 DIAGNOSIS — I10 Essential (primary) hypertension: Secondary | ICD-10-CM | POA: Diagnosis not present

## 2022-01-23 DIAGNOSIS — E1159 Type 2 diabetes mellitus with other circulatory complications: Secondary | ICD-10-CM | POA: Diagnosis not present

## 2022-01-23 DIAGNOSIS — E7800 Pure hypercholesterolemia, unspecified: Secondary | ICD-10-CM | POA: Diagnosis not present

## 2022-02-06 ENCOUNTER — Ambulatory Visit (INDEPENDENT_AMBULATORY_CARE_PROVIDER_SITE_OTHER): Payer: Medicare HMO | Admitting: Podiatry

## 2022-02-06 DIAGNOSIS — L97522 Non-pressure chronic ulcer of other part of left foot with fat layer exposed: Secondary | ICD-10-CM | POA: Diagnosis not present

## 2022-02-06 NOTE — Progress Notes (Unsigned)
  Subjective:  Patient ID: Katie Hawkins, female    DOB: 05/03/1951,  MRN: [id number]  Chief Complaint  Patient presents with   Wound Check    4 weeks (around 02/03/2022) for wound check- left foot. Patient denies any pain. Ulcer seems to be closed.     71 y.o. female returns for post-op check.  Doing well says it is healed up at this point  Review of Systems: Negative except as noted in the HPI. Denies N/V/F/Ch.   Objective:  There were no vitals filed for this visit. There is no height or weight on file to calculate BMI. Constitutional Well developed. Well nourished.  Vascular Foot warm and well perfused. Capillary refill normal to all digits.  Calf is soft and supple, no posterior calf or knee pain, negative Homans' sign  Neurologic Normal speech. Oriented to person, place, and time. Epicritic sensation to light touch grossly present bilaterally.  Dermatologic Ulceration fully healed  Orthopedic: Little to no pain left foot     Assessment:   1. Ulcerated, foot, left, with fat layer exposed ([place])    Plan:  Patient was evaluated and treated and all questions answered.  S/p foot surgery left Left foot and ulceration has healed completely without incident.  No further signs of infection.  May leave OTA.  She will monitor the feet and return to see me as needed if there is any further recurrence or problems.  We discussed the risk and possibility of further ulceration  Return if symptoms worsen or fail to improve.

## 2022-02-14 NOTE — Progress Notes (Unsigned)
Office Note     CC:  follow up Requesting Provider:  Carol Ada, MD  HPI: Katie Hawkins is a 71 y.o. (08-21-1950) female who presents for routine follow up for PAD. She underwent angiography on 10/17/2021 and was found to have an occluded superficial femoral and popliteal artery with single-vessel runoff via the peroneal.  On 10/30/2021 she underwent left common femoral to peroneal artery bypass graft with saphenous vein by Dr. Trula Slade. 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 Sherryle Lis with podiatry performed left third toe amputation. She did well post operatively. Her left toe amputation site healed well at time of her last follow up with Dr. Trula Slade. She was ambulating without difficulty. She was having some swelling in her left leg particularly during the day.  Today she reports no pain in her legs on ambulation or rest. She has no new wounds. Her swelling has also improved with elevation and compression. She was cleared by Dr. Sherryle Lis last Monday, 02/10/22.  The pt is on a statin for cholesterol management. Also takes Zetia The pt is on a daily aspirin.   Other AC: none The pt is on ACE, BB for hypertension.   The pt is diabetic. Tobacco hx:  never  Past Medical History:  Diagnosis Date   Diabetes mellitus    History of blood transfusion    HTN (hypertension), benign 02/13/2012   Hypercholesterolemia    Hyperlipidemia    Hypertension    nodular lymphocyte predominate hodgkins lymphoma dx'd 10/2010   xrt comp 02/20/11   Peripheral vascular disease [facility])     Past Surgical History:  Procedure Laterality Date   ABDOMINAL AORTOGRAM W/LOWER EXTREMITY N/A 10/18/2021   Procedure: ABDOMINAL AORTOGRAM W/LOWER EXTREMITY;  Surgeon: Cherre Robins, MD;  Location: [facility];  Service: Cardiovascular;  Laterality: N/A;   AMPUTATION TOE Left 11/01/2021   Procedure: AMPUTATION TOE;  Surgeon: [name], DPM;  Location: [facility];  Service: Podiatry;  Laterality:  Left;  will do own block   APPLICATION OF WOUND VAC Left 10/30/2021   Procedure: APPLICATION OF WOUND VAC;  Surgeon: Serafina Mitchell, MD;  Location: [facility];  Service: Vascular;  Laterality: Left;   BYPASS GRAFT POPLITEAL TO TIBIAL Left 10/30/2021   Procedure: LEFT FEMORAL TO PERONEAL BYPASS GRAFT USING VEIN;  Surgeon: Serafina Mitchell, MD;  Location: [facility];  Service: Vascular;  Laterality: Left;   CESAREAN SECTION     2   DILATION AND CURETTAGE OF UTERUS     VEIN HARVEST Left 10/30/2021   Procedure: VEIN HARVEST;  Surgeon: Serafina Mitchell, MD;  Location: [facility];  Service: Vascular;  Laterality: Left;    Social History   Socioeconomic History   Marital status: Married    Spouse name: Not on file   Number of children: Not on file   Years of education: Not on file   Highest education level: Not on file  Occupational History   Not on file  Tobacco Use   Smoking status: Never   Smokeless tobacco: Never  Vaping Use   Vaping Use: Never used  Substance and Sexual Activity   Alcohol use: Yes    Alcohol/week: 0.0 standard drinks of alcohol    Comment: occasionally   Drug use: No   Sexual activity: Not on file  Other Topics Concern   Not on file  Social History Narrative   Not on file   Social Determinants of Health   Financial  Resource Strain: Not on file  Food Insecurity: Not on file  Transportation Needs: Not on file  Physical Activity: Not on file  Stress: Not on file  Social Connections: Not on file  Intimate Partner Violence: Not on file    Family History  Problem Relation Age of Onset   Diabetes Father    Cancer Maternal Grandfather        lung   Cancer Paternal Grandfather        leukemia    Current Outpatient Medications  Medication Sig Dispense Refill   Accu-Chek FastClix Lancets MISC      acetaminophen (TYLENOL) 500 MG tablet Take 1,000 mg by mouth at bedtime as needed for moderate pain.     aspirin EC 81 MG tablet Take 1 tablet (81 mg total) by mouth daily.  Swallow whole. 150 tablet 2   ezetimibe (ZETIA) 10 MG tablet Take 10 mg by mouth daily.     glimepiride (AMARYL) 2 MG tablet Take 2 mg by mouth at bedtime.     glucose blood test strip 1 each by Other route as needed. Use as instructed. One Touch Delica Lancets used also.      lisinopril (PRINIVIL,ZESTRIL) 10 MG tablet Take 10 mg by mouth daily.       metFORMIN (GLUCOPHAGE-XR) 500 MG 24 hr tablet Take 500 mg by mouth 2 (two) times daily.     metoprolol succinate (TOPROL-XL) 25 MG 24 hr tablet Take 25 mg by mouth daily.  0   Multiple Vitamins-Minerals (MULTIVITAMIN GUMMIES ADULT PO) Take 2 tablets by mouth daily.     mupirocin ointment (BACTROBAN) 2 % Apply 1 Application topically daily. 30 g 2   pravastatin (PRAVACHOL) 40 MG tablet Take 40 mg by mouth Daily.      sulfamethoxazole-trimethoprim (BACTRIM DS) 800-160 MG tablet Take 1 tablet by mouth 2 (two) times daily. 14 tablet 0   doxycycline (VIBRAMYCIN) 50 MG capsule Take 1 capsule (50 mg total) by mouth 2 (two) times daily. (Patient not taking: Reported on 02/17/2022) 14 capsule 0   HYDROcodone-acetaminophen (NORCO/VICODIN) 5-325 MG tablet Take 1 tablet by mouth every 6 (six) hours as needed for moderate pain. (Patient not taking: Reported on 02/17/2022) 20 tablet 0   Current Facility-Administered Medications  Medication Dose Route Frequency Provider Last Rate Last Admin   mupirocin ointment (BACTROBAN) 2 %   Topical Daily McDonald, Adam R, DPM        Allergies  Allergen Reactions   Linagliptin     didn't like how she felt on this     REVIEW OF SYSTEMS:   '[X]'$  denotes positive finding, '[ ]'$  denotes negative finding Cardiac  Comments:  Chest pain or chest pressure:    Shortness of breath upon exertion:    Short of breath when lying flat:    Irregular heart rhythm:        Vascular    Pain in calf, thigh, or hip brought on by ambulation:    Pain in feet at night that wakes you up from your sleep:     Blood clot in your veins:    Leg  swelling:         Pulmonary    Oxygen at home:    Productive cough:     Wheezing:         Neurologic    Sudden weakness in arms or legs:     Sudden numbness in arms or legs:     Sudden onset of difficulty speaking  or slurred speech:    Temporary loss of vision in one eye:     Problems with dizziness:         Gastrointestinal    Blood in stool:     Vomited blood:         Genitourinary    Burning when urinating:     Blood in urine:        Psychiatric    Major depression:         Hematologic    Bleeding problems:    Problems with blood clotting too easily:        Skin    Rashes or ulcers:        Constitutional    Fever or chills:      PHYSICAL EXAMINATION:  Vitals:   02/17/22 1124  BP: (!) 161/90  Pulse: (!) 117  Resp: 16  Temp: 97.9 F (36.6 C)  TempSrc: Temporal  SpO2: 100%  Weight: 167 lb (75.8 kg)  Height: '5\' 3"'$  (1.6 m)    General:  WDWN in NAD; vital signs documented above Gait: Normal HENT: WNL, normocephalic Pulmonary: normal non-labored breathing  Cardiac: regular HR, without  Murmurs without carotid bruit Abdomen: obese, soft, NT, no masses Vascular Exam/Pulses:  Right Left  Radial 2+ (normal) 2+ (normal)  Femoral 2+ (normal) 2+ (normal)  Popliteal Not palpable Not palpable  DP Not palpable 2+ (normal)  PT Not palpable Not palpable   Extremities: without ischemic changes, without Gangrene , without cellulitis; without open wounds; left 3rd toe amputation site well healed Musculoskeletal: no muscle wasting or atrophy  Neurologic: A&O X 3;  No focal weakness or paresthesias are detected Psychiatric:  The pt has Normal affect.   Non-Invasive Vascular Imaging:   +-------+-----------+-----------+------------+------------+  ABI/TBIToday's ABIToday's TBIPrevious ABIPrevious TBI  +-------+-----------+-----------+------------+------------+  Right  0.73       0.37       0.85        0.31           +-------+-----------+-----------+------------+------------+  Left   0.98       0.52       0.51        0.18          +-------+-----------+-----------+------------+------------+   Left Graft #1: fem-peroneal  +--------------------+--------+--------+----------+--------+                      PSV cm/sStenosisWaveform  Comments  +--------------------+--------+--------+----------+--------+  Inflow              96              monophasic          +--------------------+--------+--------+----------+--------+  Proximal Anastomosis48              biphasic            +--------------------+--------+--------+----------+--------+  Proximal Graft      48              biphasic            +--------------------+--------+--------+----------+--------+  Mid Graft           90              biphasic            +--------------------+--------+--------+----------+--------+  Distal Graft        97              biphasic            +--------------------+--------+--------+----------+--------+  Distal Anastomosis  63              monophasic          +--------------------+--------+--------+----------+--------+  Outflow             114             monophasic          +--------------------+--------+--------+----------+--------+   Summary:  Left: Patent fem-peroneal BPG with no stenosis seen.   ASSESSMENT/PLAN:: 71 y.o. female here for routine follow up for PAD. She underwent angiography on 10/17/2021 and was found to have an occluded superficial femoral and popliteal artery with single-vessel runoff via the peroneal.  On 10/30/2021 she underwent left common femoral to peroneal artery bypass graft with saphenous vein by Dr. Trula Slade. 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 Sherryle Lis with podiatry performed left third toe amputation. Left 3rd toe amputation site well healed. Now cleared from Podiatry. She has known single vessel peroneal runoff on the right  from her angiography in May of this year. Presently without symptoms bilaterally and no tissue loss.  - ABI improved greatly on the left, slight decrease on the right - Duplex shows patent left lower extremity bypass - Advised keeping her feet protected - Encourage increased walking regimen - Continue Aspirin, Statin, Zetia - she knows to call for earlier follow up if she has new or concerning symptoms  - She will follow up in 6 months with LLE arterial duplex and ABIs    Karoline Caldwell, PA-C [facility] MD: Trula Slade

## 2022-02-17 ENCOUNTER — Ambulatory Visit (INDEPENDENT_AMBULATORY_CARE_PROVIDER_SITE_OTHER): Payer: PRIVATE HEALTH INSURANCE | Admitting: Physician Assistant

## 2022-02-17 ENCOUNTER — Ambulatory Visit (HOSPITAL_COMMUNITY)
Admission: RE | Admit: 2022-02-17 | Discharge: 2022-02-17 | Disposition: A | Discharge status: Home / Self Care | Payer: PRIVATE HEALTH INSURANCE | Source: Ambulatory Visit | Attending: Surgery | Admitting: Surgery

## 2022-02-17 ENCOUNTER — Encounter: Payer: Self-pay | Admitting: Physician Assistant

## 2022-02-17 ENCOUNTER — Ambulatory Visit (INDEPENDENT_AMBULATORY_CARE_PROVIDER_SITE_OTHER)
Admission: RE | Admit: 2022-02-17 | Discharge: 2022-02-17 | Disposition: A | Discharge status: Home / Self Care | Payer: PRIVATE HEALTH INSURANCE | Source: Ambulatory Visit | Attending: Surgery | Admitting: Surgery

## 2022-02-17 VITALS — BP 161/90 | HR 117 | Temp 97.9°F | Resp 16 | Ht 63.0 in | Wt 167.0 lb

## 2022-02-17 DIAGNOSIS — I739 Peripheral vascular disease, unspecified: Secondary | ICD-10-CM

## 2022-02-17 DIAGNOSIS — I70262 Atherosclerosis of native arteries of extremities with gangrene, left leg: Secondary | ICD-10-CM | POA: Diagnosis not present

## 2022-02-18 ENCOUNTER — Ambulatory Visit: Payer: PRIVATE HEALTH INSURANCE

## 2022-02-18 ENCOUNTER — Encounter (HOSPITAL_COMMUNITY): Payer: PRIVATE HEALTH INSURANCE

## 2022-02-19 ENCOUNTER — Other Ambulatory Visit: Payer: Self-pay

## 2022-02-19 DIAGNOSIS — I739 Peripheral vascular disease, unspecified: Secondary | ICD-10-CM

## 2022-02-19 DIAGNOSIS — I70262 Atherosclerosis of native arteries of extremities with gangrene, left leg: Secondary | ICD-10-CM

## 2022-03-28 DIAGNOSIS — H2512 Age-related nuclear cataract, left eye: Secondary | ICD-10-CM | POA: Diagnosis not present

## 2022-03-28 DIAGNOSIS — H25011 Cortical age-related cataract, right eye: Secondary | ICD-10-CM | POA: Diagnosis not present

## 2022-03-28 DIAGNOSIS — H2511 Age-related nuclear cataract, right eye: Secondary | ICD-10-CM | POA: Diagnosis not present

## 2022-04-21 DIAGNOSIS — H2512 Age-related nuclear cataract, left eye: Secondary | ICD-10-CM | POA: Diagnosis not present

## 2022-04-23 DIAGNOSIS — Z23 Encounter for immunization: Secondary | ICD-10-CM | POA: Diagnosis not present

## 2022-06-11 DIAGNOSIS — D649 Anemia, unspecified: Secondary | ICD-10-CM | POA: Diagnosis not present

## 2022-06-11 DIAGNOSIS — S98132A Complete traumatic amputation of one left lesser toe, initial encounter: Secondary | ICD-10-CM | POA: Diagnosis not present

## 2022-06-11 DIAGNOSIS — E1169 Type 2 diabetes mellitus with other specified complication: Secondary | ICD-10-CM | POA: Diagnosis not present

## 2022-06-11 DIAGNOSIS — I10 Essential (primary) hypertension: Secondary | ICD-10-CM | POA: Diagnosis not present

## 2022-06-11 DIAGNOSIS — E7800 Pure hypercholesterolemia, unspecified: Secondary | ICD-10-CM | POA: Diagnosis not present

## 2022-06-11 DIAGNOSIS — E1165 Type 2 diabetes mellitus with hyperglycemia: Secondary | ICD-10-CM | POA: Diagnosis not present

## 2022-06-11 DIAGNOSIS — I739 Peripheral vascular disease, unspecified: Secondary | ICD-10-CM | POA: Diagnosis not present

## 2022-08-18 ENCOUNTER — Ambulatory Visit (HOSPITAL_COMMUNITY)
Admission: RE | Admit: 2022-08-18 | Discharge: 2022-08-18 | Disposition: A | Discharge status: Home / Self Care | Payer: Medicare HMO | Source: Ambulatory Visit | Attending: Surgery | Admitting: Surgery

## 2022-08-18 ENCOUNTER — Ambulatory Visit (INDEPENDENT_AMBULATORY_CARE_PROVIDER_SITE_OTHER)
Admission: RE | Admit: 2022-08-18 | Discharge: 2022-08-18 | Disposition: A | Discharge status: Home / Self Care | Payer: Medicare HMO | Source: Ambulatory Visit | Attending: Surgery | Admitting: Surgery

## 2022-08-18 ENCOUNTER — Ambulatory Visit: Payer: Medicare HMO | Admitting: Physician Assistant

## 2022-08-18 VITALS — BP 157/90 | HR 103 | Temp 97.4°F | Resp 14 | Ht 63.0 in | Wt 174.0 lb

## 2022-08-18 DIAGNOSIS — I70201 Unspecified atherosclerosis of native arteries of extremities, right leg: Secondary | ICD-10-CM | POA: Diagnosis not present

## 2022-08-18 DIAGNOSIS — I70202 Unspecified atherosclerosis of native arteries of extremities, left leg: Secondary | ICD-10-CM | POA: Diagnosis not present

## 2022-08-18 DIAGNOSIS — I70262 Atherosclerosis of native arteries of extremities with gangrene, left leg: Secondary | ICD-10-CM | POA: Insufficient documentation

## 2022-08-18 DIAGNOSIS — I739 Peripheral vascular disease, unspecified: Secondary | ICD-10-CM

## 2022-08-18 LAB — VAS US ABI WITH/WO TBI
Left ABI: 0.92
Right ABI: 0.63

## 2022-08-20 ENCOUNTER — Encounter: Payer: Self-pay | Admitting: *Deleted

## 2022-08-20 ENCOUNTER — Other Ambulatory Visit: Payer: Self-pay | Admitting: *Deleted

## 2022-08-20 DIAGNOSIS — I739 Peripheral vascular disease, unspecified: Secondary | ICD-10-CM

## 2022-08-20 NOTE — Progress Notes (Signed)
Office Note   History of Present Illness   Katie Hawkins is a 72 y.o. (1950/06/15) female who presents for surveillance of PAD.  She underwent left lower extremity angiography on 10/17/2021 was found to have an occluded SFA and popliteal artery with single-vessel runoff via peroneal artery. On 10/30/2021 she underwent left common femoral to peroneal artery bypass with greater saphenous vein by Dr. Trula Slade.  This was done for left third toe gangrene. On 11/02/2021, she underwent left third toe amputation by Dr. Sherryle Lis.  Since her surgery she has had no issues with claudication, rest pain, nonhealing wounds of the lower extremities.  She was cleared by podiatry in September 2023 with a healed third toe amputation.  She returns today for follow-up.  She denies any rest pain, nonhealing wounds of lower extremities, or claudication.  She denies any other changes to her health.  She plans on taking a vacation to the beach in late April.  Current Outpatient Medications  Medication Sig Dispense Refill   Accu-Chek FastClix Lancets MISC      aspirin EC 81 MG tablet Take 1 tablet (81 mg total) by mouth daily. Swallow whole. 150 tablet 2   ezetimibe (ZETIA) 10 MG tablet Take 10 mg by mouth daily.     glimepiride (AMARYL) 2 MG tablet Take 2 mg by mouth at bedtime.     glucose blood test strip 1 each by Other route as needed. Use as instructed. One Touch Delica Lancets used also.      lisinopril (PRINIVIL,ZESTRIL) 10 MG tablet Take 10 mg by mouth daily.       metFORMIN (GLUCOPHAGE-XR) 500 MG 24 hr tablet Take 500 mg by mouth 2 (two) times daily.     metoprolol succinate (TOPROL-XL) 25 MG 24 hr tablet Take 25 mg by mouth daily.  0   pravastatin (PRAVACHOL) 40 MG tablet Take 40 mg by mouth Daily.      acetaminophen (TYLENOL) 500 MG tablet Take 1,000 mg by mouth at bedtime as needed for moderate pain. (Patient not taking: Reported on 08/18/2022)     doxycycline (VIBRAMYCIN) 50 MG capsule Take 1 capsule (50 mg  total) by mouth 2 (two) times daily. (Patient not taking: Reported on 02/17/2022) 14 capsule 0   HYDROcodone-acetaminophen (NORCO/VICODIN) 5-325 MG tablet Take 1 tablet by mouth every 6 (six) hours as needed for moderate pain. (Patient not taking: Reported on 02/17/2022) 20 tablet 0   Multiple Vitamins-Minerals (MULTIVITAMIN GUMMIES ADULT PO) Take 2 tablets by mouth daily. (Patient not taking: Reported on 08/18/2022)     mupirocin ointment (BACTROBAN) 2 % Apply 1 Application topically daily. (Patient not taking: Reported on 08/18/2022) 30 g 2   sulfamethoxazole-trimethoprim (BACTRIM DS) 800-160 MG tablet Take 1 tablet by mouth 2 (two) times daily. (Patient not taking: Reported on 08/18/2022) 14 tablet 0   Current Facility-Administered Medications  Medication Dose Route Frequency Provider Last Rate Last Admin   mupirocin ointment (BACTROBAN) 2 %   Topical Daily McDonald, Adam R, DPM        REVIEW OF SYSTEMS (negative unless checked):   Cardiac:  []  Chest pain or chest pressure? []  Shortness of breath upon activity? []  Shortness of breath when lying flat? []  Irregular heart rhythm?  Vascular:  []  Pain in calf, thigh, or hip brought on by walking? []  Pain in feet at night that wakes you up from your sleep? []  Blood clot in your veins? []  Leg swelling?  Pulmonary:  []  Oxygen at home? []  Productive  cough? []  Wheezing?  Neurologic:  []  Sudden weakness in arms or legs? []  Sudden numbness in arms or legs? []  Sudden onset of difficult speaking or slurred speech? []  Temporary loss of vision in one eye? []  Problems with dizziness?  Gastrointestinal:  []  Blood in stool? []  Vomited blood?  Genitourinary:  []  Burning when urinating? []  Blood in urine?  Psychiatric:  []  Major depression  Hematologic:  []  Bleeding problems? []  Problems with blood clotting?  Dermatologic:  []  Rashes or ulcers?  Constitutional:  []  Fever or chills?  Ear/Nose/Throat:  []  Change in hearing? []  Nose  bleeds? []  Sore throat?  Musculoskeletal:  []  Back pain? []  Joint pain? []  Muscle pain?   Physical Examination   Vitals:   08/18/22 1105  BP: (!) 157/90  Pulse: (!) 103  Resp: 14  Temp: (!) 97.4 F (36.3 C)  SpO2: 100%  Weight: 174 lb (78.9 kg)  Height: 5\' 3"  (1.6 m)   Body mass index is 30.82 kg/m.  General:  WDWN in NAD; vital signs documented above Gait: Not observed HENT: WNL, normocephalic Pulmonary: normal non-labored breathing  Cardiac: regular Abdomen: soft, NT, no masses Skin: without rashes Vascular Exam/Pulses: 2+ left DP pulse.  Nonpalpable pedal pulses on the right Extremities: without gangrene, without cellulitis, without edema Musculoskeletal: no muscle wasting or atrophy  Neurologic: A&O X 3;  No focal weakness or paresthesias are detected Psychiatric:  The pt has Normal affect.  Non-Invasive Vascular Imaging ABI (08/18/2022) R:  ABI: 0.63 (0.73),  PT: mono DP: mono TBI:  0.23 L:  ABI: 0.92 (0.98),  PT: mono DP: mono TBI: 0.43  LLE Bypass Duplex (08/18/2022) Patent left femoral to peroneal artery bypass.  Greater than 70% stenosis in the mid graft with PSV 402.   Medical Decision Making   An Katie Hawkins is a 72 y.o. female who presents for surveillance of PAD  Based on the patient's vascular studies ABIs have slightly decreased on the right from 0.73 to 0.63.  Her ABIs on the left are essentially unchanged, however she has monophasic flow in the left PT/DP Duplex of the left lower extremity bypass graft demonstrates a patent bypass with greater than 70% stenosis in the mid graft, with PSV at 402.  This is potentially flow limiting stenosis and threatens the bypass graft She denies any claudication, rest pain, nonhealing wounds of lower extremities.  I explained to the patient she will need a left lower extremity angiogram with possible angioplasty/stent of the bypass graft to treat high-grade stenosis.  I have explained it is best she  undergoes this procedure within the next month.  She plans to have a vacation at the beach in late April, and she would like to wait until early May for her procedure. Will get the patient scheduled for a left lower extremity angiogram with Dr. Trula Slade in early May.  The patient understands if she develops any worsening symptoms, she should call our office to reschedule her procedure sooner   Vicente Serene PA-C [facility] Office: [facility] MD: Trula Slade

## 2022-09-30 ENCOUNTER — Encounter (HOSPITAL_COMMUNITY): Payer: Self-pay | Admitting: Surgery

## 2022-09-30 ENCOUNTER — Other Ambulatory Visit: Payer: Self-pay

## 2022-09-30 ENCOUNTER — Ambulatory Visit (HOSPITAL_COMMUNITY)
Admission: RE | Admit: 2022-09-30 | Discharge: 2022-09-30 | Disposition: A | Discharge status: Home / Self Care | Payer: Medicare HMO | Attending: Surgery | Admitting: Surgery

## 2022-09-30 ENCOUNTER — Encounter (HOSPITAL_COMMUNITY)
Admission: RE | Disposition: A | Discharge status: Home / Self Care | Payer: Self-pay | Source: Home / Self Care | Attending: Surgery

## 2022-09-30 DIAGNOSIS — T82858A Stenosis of vascular prosthetic devices, implants and grafts, initial encounter: Secondary | ICD-10-CM | POA: Diagnosis not present

## 2022-09-30 DIAGNOSIS — I739 Peripheral vascular disease, unspecified: Secondary | ICD-10-CM | POA: Insufficient documentation

## 2022-09-30 DIAGNOSIS — Y832 Surgical operation with anastomosis, bypass or graft as the cause of abnormal reaction of the patient, or of later complication, without mention of misadventure at the time of the procedure: Secondary | ICD-10-CM | POA: Insufficient documentation

## 2022-09-30 HISTORY — PX: PERIPHERAL VASCULAR BALLOON ANGIOPLASTY: CATH118281

## 2022-09-30 HISTORY — PX: ABDOMINAL AORTOGRAM W/LOWER EXTREMITY: CATH118223

## 2022-09-30 LAB — POCT I-STAT, CHEM 8
BUN: 21 mg/dL (ref 8–23)
Calcium, Ion: 1.21 mmol/L (ref 1.15–1.40)
Chloride: 101 mmol/L (ref 98–111)
Creatinine, Ser: 0.6 mg/dL (ref 0.44–1.00)
Glucose, Bld: 185 mg/dL — ABNORMAL HIGH (ref 70–99)
HCT: 38 % (ref 36.0–46.0)
Hemoglobin: 12.9 g/dL (ref 12.0–15.0)
Potassium: 3.8 mmol/L (ref 3.5–5.1)
Sodium: 137 mmol/L (ref 135–145)
TCO2: 24 mmol/L (ref 22–32)

## 2022-09-30 SURGERY — ABDOMINAL AORTOGRAM W/LOWER EXTREMITY
Anesthesia: LOCAL

## 2022-09-30 MED ORDER — CLOPIDOGREL BISULFATE 75 MG PO TABS: 75.0000 mg | ORAL_TABLET | Freq: Every day | ORAL | Status: DC

## 2022-09-30 MED ORDER — HEPARIN (PORCINE) IN NACL 1000-0.9 UT/500ML-% IV SOLN
INTRAVENOUS | Status: DC | PRN
  Administered 2022-09-30 (×2): 500 mL

## 2022-09-30 MED ORDER — FENTANYL CITRATE (PF) 100 MCG/2ML IJ SOLN
INTRAMUSCULAR | Status: DC | PRN
  Administered 2022-09-30: 50 ug via INTRAVENOUS

## 2022-09-30 MED ORDER — HYDRALAZINE HCL 20 MG/ML IJ SOLN: 5.0000 mg | INTRAMUSCULAR | Status: DC | PRN

## 2022-09-30 MED ORDER — SODIUM CHLORIDE 0.9 % IV SOLN: 250.0000 mL | INTRAVENOUS | Status: DC | PRN

## 2022-09-30 MED ORDER — ASPIRIN 81 MG PO TBEC
81.0000 mg | DELAYED_RELEASE_TABLET | Freq: Every day | ORAL | Status: DC
  Administered 2022-09-30: 81 mg via ORAL
  Filled 2022-09-30: qty 1

## 2022-09-30 MED ORDER — IODIXANOL 320 MG/ML IV SOLN
INTRAVENOUS | Status: DC | PRN
  Administered 2022-09-30: 100 mL

## 2022-09-30 MED ORDER — LABETALOL HCL 5 MG/ML IV SOLN: 10.0000 mg | INTRAVENOUS | Status: DC | PRN

## 2022-09-30 MED ORDER — SODIUM CHLORIDE 0.9% FLUSH: 3.0000 mL | Freq: Two times a day (BID) | INTRAVENOUS | Status: DC

## 2022-09-30 MED ORDER — SODIUM CHLORIDE 0.9 % WEIGHT BASED INFUSION
1.0000 mL/kg/h | INTRAVENOUS | Status: DC
  Administered 2022-09-30: 1 mL/kg/h via INTRAVENOUS

## 2022-09-30 MED ORDER — LIDOCAINE HCL (PF) 1 % IJ SOLN
INTRAMUSCULAR | Status: DC | PRN
  Administered 2022-09-30: 15 mL

## 2022-09-30 MED ORDER — MORPHINE SULFATE (PF) 2 MG/ML IV SOLN: 2.0000 mg | INTRAVENOUS | Status: DC | PRN

## 2022-09-30 MED ORDER — LIDOCAINE HCL (PF) 1 % IJ SOLN
INTRAMUSCULAR | Status: AC
  Filled 2022-09-30: qty 30

## 2022-09-30 MED ORDER — HEPARIN SODIUM (PORCINE) 1000 UNIT/ML IJ SOLN
INTRAMUSCULAR | Status: DC | PRN
  Administered 2022-09-30: 8000 [IU] via INTRAVENOUS

## 2022-09-30 MED ORDER — FENTANYL CITRATE (PF) 100 MCG/2ML IJ SOLN
INTRAMUSCULAR | Status: AC
  Filled 2022-09-30: qty 2

## 2022-09-30 MED ORDER — HEPARIN SODIUM (PORCINE) 1000 UNIT/ML IJ SOLN
INTRAMUSCULAR | Status: AC
  Filled 2022-09-30: qty 10

## 2022-09-30 MED ORDER — CLOPIDOGREL BISULFATE 300 MG PO TABS
ORAL_TABLET | ORAL | Status: DC | PRN
  Administered 2022-09-30: 300 mg via ORAL

## 2022-09-30 MED ORDER — SODIUM CHLORIDE 0.9 % IV SOLN: INTRAVENOUS | Status: DC

## 2022-09-30 MED ORDER — ACETAMINOPHEN 325 MG PO TABS: 650.0000 mg | ORAL_TABLET | ORAL | Status: DC | PRN

## 2022-09-30 MED ORDER — ONDANSETRON HCL 4 MG/2ML IJ SOLN: 4.0000 mg | Freq: Four times a day (QID) | INTRAMUSCULAR | Status: DC | PRN

## 2022-09-30 MED ORDER — CLOPIDOGREL BISULFATE 300 MG PO TABS
ORAL_TABLET | ORAL | Status: AC
  Filled 2022-09-30: qty 1

## 2022-09-30 MED ORDER — SODIUM CHLORIDE 0.9% FLUSH: 3.0000 mL | INTRAVENOUS | Status: DC | PRN

## 2022-09-30 MED ORDER — CLOPIDOGREL BISULFATE 75 MG PO TABS: 75.0000 mg | ORAL_TABLET | Freq: Every day | ORAL | 11 refills | Status: DC

## 2022-09-30 MED ORDER — MIDAZOLAM HCL 2 MG/2ML IJ SOLN
INTRAMUSCULAR | Status: AC
  Filled 2022-09-30: qty 2

## 2022-09-30 MED ORDER — CLOPIDOGREL BISULFATE 75 MG PO TABS: 300.0000 mg | ORAL_TABLET | Freq: Once | ORAL | Status: DC

## 2022-09-30 MED ORDER — OXYCODONE HCL 5 MG PO TABS: 5.0000 mg | ORAL_TABLET | ORAL | Status: DC | PRN

## 2022-09-30 MED ORDER — MIDAZOLAM HCL 2 MG/2ML IJ SOLN
INTRAMUSCULAR | Status: DC | PRN
  Administered 2022-09-30: 1 mg via INTRAVENOUS

## 2022-09-30 SURGICAL SUPPLY — 21 items
CATH NAVICROSS ST .035X90CM (MICROCATHETER) IMPLANT
CATH OMNI FLUSH 5F 65CM (CATHETERS) IMPLANT
CATH STRAIGHT 5FR 65CM (CATHETERS) IMPLANT
DCB RANGER 4.0X100 135 (BALLOONS) IMPLANT
DEVICE VASC CLSR CELT ART 5 (Vascular Products) IMPLANT
GUIDEWIRE ANGLED .035X150CM (WIRE) IMPLANT
KIT ENCORE 26 ADVANTAGE (KITS) IMPLANT
KIT MICROPUNCTURE NIT STIFF (SHEATH) IMPLANT
KIT PV (KITS) ×2 IMPLANT
RANGER DCB 4.0X100 135 (BALLOONS) ×2
SHEATH CATAPULT 5F 45 MP (SHEATH) IMPLANT
SHEATH PINNACLE 5F 10CM (SHEATH) IMPLANT
SHEATH PROBE COVER 6X72 (BAG) IMPLANT
STOPCOCK MORSE 400PSI 3WAY (MISCELLANEOUS) IMPLANT
SYR MEDRAD MARK 7 150ML (SYRINGE) ×2 IMPLANT
TRANSDUCER W/STOPCOCK (MISCELLANEOUS) ×2 IMPLANT
TRAY PV CATH (CUSTOM PROCEDURE TRAY) ×2 IMPLANT
TUBING CIL FLEX 10 FLL-RA (TUBING) IMPLANT
WIRE HI TORQ VERSACORE 300 (WIRE) IMPLANT
WIRE SPARTACORE .014X300CM (WIRE) IMPLANT
WIRE STARTER BENTSON 035X150 (WIRE) IMPLANT

## 2022-09-30 NOTE — H&P (Signed)
  Office Note   History of Present Illness   Katie Hawkins is a 72 y.o. (09/08/1950) female who presents for surveillance of PAD.  She underwent left lower extremity angiography on 10/17/2021 was found to have an occluded SFA and popliteal artery with single-vessel runoff via peroneal artery. On 10/30/2021 she underwent left common femoral to peroneal artery bypass with greater saphenous vein by Dr. Ladena Jacquez.  This was done for left third toe gangrene. On 11/02/2021, she underwent left third toe amputation by Dr. McDonald.  Since her surgery she has had no issues with claudication, rest pain, nonhealing wounds of the lower extremities.  She was cleared by podiatry in September 2023 with a healed third toe amputation.  She returns today for follow-up.  She denies any rest pain, nonhealing wounds of lower extremities, or claudication.  She denies any other changes to her health.  She plans on taking a vacation to the beach in late April.  Current Outpatient Medications  Medication Sig Dispense Refill   Accu-Chek FastClix Lancets MISC      aspirin EC 81 MG tablet Take 1 tablet (81 mg total) by mouth daily. Swallow whole. 150 tablet 2   ezetimibe (ZETIA) 10 MG tablet Take 10 mg by mouth daily.     glimepiride (AMARYL) 2 MG tablet Take 2 mg by mouth at bedtime.     glucose blood test strip 1 each by Other route as needed. Use as instructed. One Touch Delica Lancets used also.      lisinopril (PRINIVIL,ZESTRIL) 10 MG tablet Take 10 mg by mouth daily.       metFORMIN (GLUCOPHAGE-XR) 500 MG 24 hr tablet Take 500 mg by mouth 2 (two) times daily.     metoprolol succinate (TOPROL-XL) 25 MG 24 hr tablet Take 25 mg by mouth daily.  0   pravastatin (PRAVACHOL) 40 MG tablet Take 40 mg by mouth Daily.      acetaminophen (TYLENOL) 500 MG tablet Take 1,000 mg by mouth at bedtime as needed for moderate pain. (Patient not taking: Reported on 08/18/2022)     doxycycline (VIBRAMYCIN) 50 MG capsule Take 1 capsule (50 mg  total) by mouth 2 (two) times daily. (Patient not taking: Reported on 02/17/2022) 14 capsule 0   HYDROcodone-acetaminophen (NORCO/VICODIN) 5-325 MG tablet Take 1 tablet by mouth every 6 (six) hours as needed for moderate pain. (Patient not taking: Reported on 02/17/2022) 20 tablet 0   Multiple Vitamins-Minerals (MULTIVITAMIN GUMMIES ADULT PO) Take 2 tablets by mouth daily. (Patient not taking: Reported on 08/18/2022)     mupirocin ointment (BACTROBAN) 2 % Apply 1 Application topically daily. (Patient not taking: Reported on 08/18/2022) 30 g 2   sulfamethoxazole-trimethoprim (BACTRIM DS) 800-160 MG tablet Take 1 tablet by mouth 2 (two) times daily. (Patient not taking: Reported on 08/18/2022) 14 tablet 0   Current Facility-Administered Medications  Medication Dose Route Frequency Provider Last Rate Last Admin   mupirocin ointment (BACTROBAN) 2 %   Topical Daily McDonald, Adam R, DPM        REVIEW OF SYSTEMS (negative unless checked):   Cardiac:  [] Chest pain or chest pressure? [] Shortness of breath upon activity? [] Shortness of breath when lying flat? [] Irregular heart rhythm?  Vascular:  [] Pain in calf, thigh, or hip brought on by walking? [] Pain in feet at night that wakes you up from your sleep? [] Blood clot in your veins? [] Leg swelling?  Pulmonary:  [] Oxygen at home? [] Productive   cough? [] Wheezing?  Neurologic:  [] Sudden weakness in arms or legs? [] Sudden numbness in arms or legs? [] Sudden onset of difficult speaking or slurred speech? [] Temporary loss of vision in one eye? [] Problems with dizziness?  Gastrointestinal:  [] Blood in stool? [] Vomited blood?  Genitourinary:  [] Burning when urinating? [] Blood in urine?  Psychiatric:  [] Major depression  Hematologic:  [] Bleeding problems? [] Problems with blood clotting?  Dermatologic:  [] Rashes or ulcers?  Constitutional:  [] Fever or chills?  Ear/Nose/Throat:  [] Change in hearing? [] Nose  bleeds? [] Sore throat?  Musculoskeletal:  [] Back pain? [] Joint pain? [] Muscle pain?   Physical Examination   Vitals:   08/18/22 1105  BP: (!) 157/90  Pulse: (!) 103  Resp: 14  Temp: (!) 97.4 F (36.3 C)  SpO2: 100%  Weight: 174 lb (78.9 kg)  Height: 5' 3" (1.6 m)   Body mass index is 30.82 kg/m.  General:  WDWN in NAD; vital signs documented above Gait: Not observed HENT: WNL, normocephalic Pulmonary: normal non-labored breathing  Cardiac: regular Abdomen: soft, NT, no masses Skin: without rashes Vascular Exam/Pulses: 2+ left DP pulse.  Nonpalpable pedal pulses on the right Extremities: without gangrene, without cellulitis, without edema Musculoskeletal: no muscle wasting or atrophy  Neurologic: A&O X 3;  No focal weakness or paresthesias are detected Psychiatric:  The pt has Normal affect.  Non-Invasive Vascular Imaging ABI (08/18/2022) R:  ABI: 0.63 (0.73),  PT: mono DP: mono TBI:  0.23 L:  ABI: 0.92 (0.98),  PT: mono DP: mono TBI: 0.43  LLE Bypass Duplex (08/18/2022) Patent left femoral to peroneal artery bypass.  Greater than 70% stenosis in the mid graft with PSV 402.   Medical Decision Making   Katie Hawkins is a 72 y.o. female who presents for surveillance of PAD  Based on the patient's vascular studies ABIs have slightly decreased on the right from 0.73 to 0.63.  Her ABIs on the left are essentially unchanged, however she has monophasic flow in the left PT/DP Duplex of the left lower extremity bypass graft demonstrates a patent bypass with greater than 70% stenosis in the mid graft, with PSV at 402.  This is potentially flow limiting stenosis and threatens the bypass graft She denies any claudication, rest pain, nonhealing wounds of lower extremities.  I explained to the patient she will need a left lower extremity angiogram with possible angioplasty/stent of the bypass graft to treat high-grade stenosis.  I have explained it is best she  undergoes this procedure within the next month.  She plans to have a vacation at the beach in late April, and she would like to wait until early May for her procedure. Will get the patient scheduled for a left lower extremity angiogram with Dr. Denver Bentson in early May.  The patient understands if she develops any worsening symptoms, she should call our office to reschedule her procedure sooner   McKenzi Schuh PA-C [facility] [place] Office: [phone]  Clinic MD: Jahni Paul 

## 2022-09-30 NOTE — Op Note (Signed)
Patient name: Katie Hawkins MRN: [id number] DOB: 14-Mar-1951 Sex: female  09/30/2022 Pre-operative Diagnosis: Left leg bypass graft stenosis Post-operative diagnosis:  Same Surgeon:  Durene Cal Procedure Performed:  1.  Ultrasound-guided access, right femoral artery  2.  Aortobifemoral angiogram  3.  Left leg angiogram  4.  Selective angiogram with catheter and left femoral peroneal bypass  5.  Drug-coated balloon angioplasty, left femoral peroneal bypass  6.  Conscious sedation, 57 minutes  7.  Closure device, Celt   Indications: This is a 72 year old female who underwent left femoral to peroneal artery bypass graft with vein for a ulcer.  Her wound has healed.  Surveillance imaging revealed a high-grade stenosis within the midportion of her bypass graft.  She comes in today for further evaluation and possible intervention  Procedure:  The patient was identified in the holding area and taken to room 8.  The patient was then placed supine on the table and prepped and draped in the usual sterile fashion.  A time out was called.  Conscious sedation was administered with the use of IV fentanyl and Versed under continuous physician and nurse monitoring.  Heart rate, blood pressure, and oxygen saturation were continuously monitored.  Total sedation time was 57 minutes.  Ultrasound was used to evaluate the right common femoral artery.  It was patent .  A digital ultrasound image was acquired.  A micropuncture needle was used to access the right common femoral artery under ultrasound guidance.  An 018 wire was advanced without resistance and a micropuncture sheath was placed.  The 018 wire was removed and a benson wire was placed.  The micropuncture sheath was exchanged for a 5 french sheath.  An omniflush catheter was advanced over the wire to the level of L-1.  An abdominal angiogram was obtained.  Next, using the omniflush catheter and a benson wire, the aortic bifurcation was crossed and the  catheter was placed into theleft external iliac artery and left runoff was obtained.   Findings:   Aortogram: No significant renal artery stenosis was visualized.  The infrarenal abdominal aorta is widely patent.  Bilateral common and external iliac arteries are widely patent.  The visualized portions of the common femoral artery were widely patent    Left Lower Extremity: The left common femoral and profundofemoral artery are widely patent.  There is a bypass graft originating from the common femoral artery with distal anastomosis to the peroneal artery which is the single-vessel runoff.  There are tandem stenoses within the midportion of the graft, the most distal is approximately 90% more proximally it is about 50%.  The superficial femoral artery is patent proximally but occludes distally.  Intervention: After the above images were acquired, the decision was made to proceed with intervention.  A 5 French sheath was advanced over the aortic bifurcation.  Using a Nava cross catheter and a Glidewire, the bypass graft was selected.  A contrast injection was performed through the catheter in the bypass graft to better define and locate the stenoses.  Next, the patient was fully heparinized.  I then placed a [place] core wire across the lesions and selected a 4 x 100 drug-coated Ranger balloon.  The balloon was taken to 12 atm for 3 minutes.  Completion imaging showed resolution of the stenoses.  A short 5 French sheath was then exchanged for the longer sheath and a Celt was used for closure.  There were no immediate complications  Impression:  #1  Tandem left femoral-popliteal  bypass graft lesions, 50 and 90%, both treated using a 4 x 100 drug-coated Ranger balloon  #2  Patient will complete 3 months of Plavix   V. Durene Cal, M.D., [facility] [place] Office: [phone] Pager:  [phone]

## 2022-10-24 ENCOUNTER — Other Ambulatory Visit: Payer: Self-pay | Admitting: *Deleted

## 2022-10-24 DIAGNOSIS — I70262 Atherosclerosis of native arteries of extremities with gangrene, left leg: Secondary | ICD-10-CM

## 2022-10-24 DIAGNOSIS — I739 Peripheral vascular disease, unspecified: Secondary | ICD-10-CM

## 2022-11-03 ENCOUNTER — Ambulatory Visit (INDEPENDENT_AMBULATORY_CARE_PROVIDER_SITE_OTHER): Payer: Medicare HMO | Admitting: Surgery

## 2022-11-03 ENCOUNTER — Encounter: Payer: Self-pay | Admitting: Surgery

## 2022-11-03 ENCOUNTER — Ambulatory Visit (INDEPENDENT_AMBULATORY_CARE_PROVIDER_SITE_OTHER)
Admission: RE | Admit: 2022-11-03 | Discharge: 2022-11-03 | Disposition: A | Discharge status: Home / Self Care | Payer: Medicare HMO | Source: Ambulatory Visit | Attending: Surgery | Admitting: Surgery

## 2022-11-03 ENCOUNTER — Ambulatory Visit (HOSPITAL_COMMUNITY)
Admission: RE | Admit: 2022-11-03 | Discharge: 2022-11-03 | Disposition: A | Discharge status: Home / Self Care | Payer: Medicare HMO | Source: Ambulatory Visit | Attending: Surgery | Admitting: Surgery

## 2022-11-03 VITALS — BP 134/75 | HR 106 | Temp 98.2°F | Resp 20 | Ht 63.0 in | Wt 171.0 lb

## 2022-11-03 DIAGNOSIS — I739 Peripheral vascular disease, unspecified: Secondary | ICD-10-CM | POA: Insufficient documentation

## 2022-11-03 DIAGNOSIS — I70262 Atherosclerosis of native arteries of extremities with gangrene, left leg: Secondary | ICD-10-CM | POA: Diagnosis not present

## 2022-11-03 LAB — VAS US ABI WITH/WO TBI
Left ABI: 0.99
Right ABI: 0.73

## 2022-11-03 NOTE — Progress Notes (Signed)
[facility] [place]  Patient name: Katie Hawkins MRN: [id number] DOB: [birth year] Sex: female   REASON FOR VISIT:    Follow up  HISOTRY OF PRESENT ILLNESS:    Katie Hawkins is a 72 y.o. female who is status post left femoral to peroneal artery bypass graft with saphenous vein on 10/30/2021.  This was done for left toe gangrene.  She underwent amputation by podiatry on 11/02/2021.  Surveillance imaging revealed a stenosis within her bypass graft.  She went for angiography on 09/30/2022.  She was found to have tandem left femoral-popliteal bypass graft lesions, 50 and 90%.  Both were treated with a 4 mm drug-coated balloon.  She is walking at least half a mile every day.  She remains on aspirin Plavix and statin   PAST MEDICAL HISTORY:   Past Medical History:  Diagnosis Date   Diabetes mellitus    History of blood transfusion    HTN (hypertension), benign 02/13/2012   Hypercholesterolemia    Hyperlipidemia    Hypertension    nodular lymphocyte predominate hodgkins lymphoma dx'd 10/2010   xrt comp 02/20/11   Peripheral vascular disease (HCC)      FAMILY HISTORY:   Family History  Problem Relation Age of Onset   Diabetes Father    Cancer Maternal Grandfather        lung   Cancer Paternal Grandfather        leukemia    SOCIAL HISTORY:   Social History   Tobacco Use   Smoking status: Never   Smokeless tobacco: Never  Substance Use Topics   Alcohol use: Yes    Alcohol/week: 0.0 standard drinks of alcohol    Comment: occasionally     ALLERGIES:   Allergies  Allergen Reactions   Linagliptin     didn't like how she felt on this     CURRENT MEDICATIONS:   Current Outpatient Medications  Medication Sig Dispense Refill   Accu-Chek FastClix Lancets MISC      acetaminophen (TYLENOL) 500 MG tablet Take 1,000 mg by mouth at bedtime as needed for moderate pain.     clopidogrel (PLAVIX) 75 MG tablet Take 1  tablet (75 mg total) by mouth daily. 30 tablet 11   ezetimibe (ZETIA) 10 MG tablet Take 10 mg by mouth daily.     glimepiride (AMARYL) 2 MG tablet Take 2 mg by mouth at bedtime.     glucose blood test strip 1 each by Other route as needed. Use as instructed. One Touch Delica Lancets used also.      Glycerin-Hypromellose-PEG 400 (DRY EYE RELIEF DROPS OP) Place 1 drop into both eyes daily as needed (dry eye).     lisinopril (PRINIVIL,ZESTRIL) 10 MG tablet Take 10 mg by mouth daily.       metFORMIN (GLUCOPHAGE-XR) 500 MG 24 hr tablet Take 500 mg by mouth 2 (two) times daily.     metoprolol succinate (TOPROL-XL) 25 MG 24 hr tablet Take 25 mg by mouth daily.  0   Multiple Vitamins-Minerals (MULTIVITAMIN GUMMIES ADULT PO) Take 1 tablet by mouth daily.     pravastatin (PRAVACHOL) 40 MG tablet Take 40 mg by mouth Daily.      Current Facility-Administered Medications  Medication Dose Route Frequency Provider Last Rate Last Admin   mupirocin ointment (BACTROBAN) 2 %   Topical Daily McDonald, Rachelle Hora, DPM        REVIEW OF SYSTEMS:   [X]  denotes positive finding, [ ]  denotes negative finding  Cardiac  Comments:  Chest pain or chest pressure:    Shortness of breath upon exertion:    Short of breath when lying flat:    Irregular heart rhythm:        Vascular    Pain in calf, thigh, or hip brought on by ambulation:    Pain in feet at night that wakes you up from your sleep:     Blood clot in your veins:    Leg swelling:         Pulmonary    Oxygen at home:    Productive cough:     Wheezing:         Neurologic    Sudden weakness in arms or legs:     Sudden numbness in arms or legs:     Sudden onset of difficulty speaking or slurred speech:    Temporary loss of vision in one eye:     Problems with dizziness:         Gastrointestinal    Blood in stool:     Vomited blood:         Genitourinary    Burning when urinating:     Blood in urine:        Psychiatric    Major depression:          Hematologic    Bleeding problems:    Problems with blood clotting too easily:        Skin    Rashes or ulcers:        Constitutional    Fever or chills:      PHYSICAL EXAM:   Vitals:   11/03/22 1207  BP: 134/75  Pulse: (!) 106  Resp: 20  Temp: 98.2 F (36.8 C)  SpO2: 94%  Weight: 171 lb (77.6 kg)  Height: 5\' 3"  (1.6 m)    GENERAL: The patient is a well-nourished female, in no acute distress. The vital signs are documented above. CARDIAC: There is a regular rate and rhythm.  PULMONARY: Non-labored respirations MUSCULOSKELETAL: There are no major deformities or cyanosis. NEUROLOGIC: No focal weakness or paresthesias are detected. SKIN: There are no ulcers or rashes noted. PSYCHIATRIC: The patient has a normal affect.  STUDIES:   I reviewed the following: +-------+-----------+-----------+------------+------------+  ABI/TBIToday's ABIToday's TBIPrevious ABIPrevious TBI  +-------+-----------+-----------+------------+------------+  Right 0.73       0.61       0.63        0.23          +-------+-----------+-----------+------------+------------+  Left  0.99       0.50       0.92        0.43          +-------+-----------+-----------+------------+------------+  Left: Patent bypass with velocities in the proximal segment suggestive of  a 50-70% stenosis and at the distal anastomosis 50-70% stenosis.    MEDICAL ISSUES:   Bypass graft is widely patent.  I suspect the distal anastomotic issue is a size mismatch as this was widely patent on her recent angiogram.  She will return in 6 months for surveillance imaging    Durene Cal, IV, MD, FACS [facility] [phone] Pager [phone]

## 2022-11-07 DIAGNOSIS — E11319 Type 2 diabetes mellitus with unspecified diabetic retinopathy without macular edema: Secondary | ICD-10-CM | POA: Diagnosis not present

## 2022-11-12 ENCOUNTER — Other Ambulatory Visit: Payer: Self-pay

## 2022-11-12 DIAGNOSIS — I70262 Atherosclerosis of native arteries of extremities with gangrene, left leg: Secondary | ICD-10-CM

## 2022-11-12 DIAGNOSIS — I739 Peripheral vascular disease, unspecified: Secondary | ICD-10-CM

## 2022-12-24 DIAGNOSIS — Z89422 Acquired absence of other left toe(s): Secondary | ICD-10-CM | POA: Diagnosis not present

## 2022-12-24 DIAGNOSIS — E7800 Pure hypercholesterolemia, unspecified: Secondary | ICD-10-CM | POA: Diagnosis not present

## 2022-12-24 DIAGNOSIS — Z1211 Encounter for screening for malignant neoplasm of colon: Secondary | ICD-10-CM | POA: Diagnosis not present

## 2022-12-24 DIAGNOSIS — E1151 Type 2 diabetes mellitus with diabetic peripheral angiopathy without gangrene: Secondary | ICD-10-CM | POA: Diagnosis not present

## 2022-12-24 DIAGNOSIS — H6123 Impacted cerumen, bilateral: Secondary | ICD-10-CM | POA: Diagnosis not present

## 2022-12-24 DIAGNOSIS — I10 Essential (primary) hypertension: Secondary | ICD-10-CM | POA: Diagnosis not present

## 2022-12-24 DIAGNOSIS — Z0000 Encounter for general adult medical examination without abnormal findings: Secondary | ICD-10-CM | POA: Diagnosis not present

## 2022-12-24 DIAGNOSIS — H900 Conductive hearing loss, bilateral: Secondary | ICD-10-CM | POA: Diagnosis not present

## 2022-12-24 DIAGNOSIS — E1169 Type 2 diabetes mellitus with other specified complication: Secondary | ICD-10-CM | POA: Diagnosis not present

## 2022-12-24 DIAGNOSIS — Z1331 Encounter for screening for depression: Secondary | ICD-10-CM | POA: Diagnosis not present

## 2023-01-21 DIAGNOSIS — Z1231 Encounter for screening mammogram for malignant neoplasm of breast: Secondary | ICD-10-CM | POA: Diagnosis not present

## 2023-02-06 DIAGNOSIS — E11319 Type 2 diabetes mellitus with unspecified diabetic retinopathy without macular edema: Secondary | ICD-10-CM | POA: Diagnosis not present

## 2023-03-02 DIAGNOSIS — Z860100 Personal history of colon polyps, unspecified: Secondary | ICD-10-CM | POA: Diagnosis not present

## 2023-03-02 DIAGNOSIS — K649 Unspecified hemorrhoids: Secondary | ICD-10-CM | POA: Diagnosis not present

## 2023-03-02 DIAGNOSIS — K5730 Diverticulosis of large intestine without perforation or abscess without bleeding: Secondary | ICD-10-CM | POA: Diagnosis not present

## 2023-03-02 DIAGNOSIS — D123 Benign neoplasm of transverse colon: Secondary | ICD-10-CM | POA: Diagnosis not present

## 2023-03-02 DIAGNOSIS — Z09 Encounter for follow-up examination after completed treatment for conditions other than malignant neoplasm: Secondary | ICD-10-CM | POA: Diagnosis not present

## 2023-03-02 DIAGNOSIS — D122 Benign neoplasm of ascending colon: Secondary | ICD-10-CM | POA: Diagnosis not present

## 2023-03-04 DIAGNOSIS — D122 Benign neoplasm of ascending colon: Secondary | ICD-10-CM | POA: Diagnosis not present

## 2023-03-04 DIAGNOSIS — D123 Benign neoplasm of transverse colon: Secondary | ICD-10-CM | POA: Diagnosis not present

## 2023-05-11 ENCOUNTER — Encounter (HOSPITAL_COMMUNITY): Payer: PRIVATE HEALTH INSURANCE

## 2023-05-11 ENCOUNTER — Ambulatory Visit: Payer: PRIVATE HEALTH INSURANCE | Admitting: Surgery

## 2023-05-11 ENCOUNTER — Other Ambulatory Visit (HOSPITAL_COMMUNITY): Payer: PRIVATE HEALTH INSURANCE

## 2023-06-08 ENCOUNTER — Encounter: Payer: Self-pay | Admitting: Surgery

## 2023-06-08 ENCOUNTER — Ambulatory Visit (INDEPENDENT_AMBULATORY_CARE_PROVIDER_SITE_OTHER): Payer: Medicare HMO | Admitting: Surgery

## 2023-06-08 ENCOUNTER — Ambulatory Visit (INDEPENDENT_AMBULATORY_CARE_PROVIDER_SITE_OTHER)
Admission: RE | Admit: 2023-06-08 | Discharge: 2023-06-08 | Disposition: A | Discharge status: Home / Self Care | Payer: Medicare HMO | Source: Ambulatory Visit | Attending: Surgery | Admitting: Surgery

## 2023-06-08 ENCOUNTER — Other Ambulatory Visit: Payer: Self-pay

## 2023-06-08 VITALS — BP 149/77 | HR 108 | Temp 97.4°F | Resp 20 | Ht 63.0 in | Wt 171.1 lb

## 2023-06-08 DIAGNOSIS — Z79899 Other long term (current) drug therapy: Secondary | ICD-10-CM | POA: Diagnosis not present

## 2023-06-08 DIAGNOSIS — I70262 Atherosclerosis of native arteries of extremities with gangrene, left leg: Secondary | ICD-10-CM

## 2023-06-08 DIAGNOSIS — Z7984 Long term (current) use of oral hypoglycemic drugs: Secondary | ICD-10-CM | POA: Diagnosis not present

## 2023-06-08 DIAGNOSIS — I739 Peripheral vascular disease, unspecified: Secondary | ICD-10-CM

## 2023-06-08 DIAGNOSIS — Z7902 Long term (current) use of antithrombotics/antiplatelets: Secondary | ICD-10-CM | POA: Diagnosis not present

## 2023-06-08 DIAGNOSIS — I10 Essential (primary) hypertension: Secondary | ICD-10-CM | POA: Diagnosis present

## 2023-06-08 DIAGNOSIS — T82868A Thrombosis of vascular prosthetic devices, implants and grafts, initial encounter: Secondary | ICD-10-CM | POA: Diagnosis not present

## 2023-06-08 DIAGNOSIS — Z833 Family history of diabetes mellitus: Secondary | ICD-10-CM | POA: Diagnosis not present

## 2023-06-08 DIAGNOSIS — Z9889 Other specified postprocedural states: Secondary | ICD-10-CM | POA: Diagnosis not present

## 2023-06-08 DIAGNOSIS — Z888 Allergy status to other drugs, medicaments and biological substances status: Secondary | ICD-10-CM | POA: Diagnosis not present

## 2023-06-08 DIAGNOSIS — I7777 Dissection of artery of lower extremity: Secondary | ICD-10-CM | POA: Diagnosis not present

## 2023-06-08 DIAGNOSIS — E1151 Type 2 diabetes mellitus with diabetic peripheral angiopathy without gangrene: Secondary | ICD-10-CM | POA: Diagnosis present

## 2023-06-08 DIAGNOSIS — E7800 Pure hypercholesterolemia, unspecified: Secondary | ICD-10-CM | POA: Diagnosis present

## 2023-06-08 LAB — VAS US ABI WITH/WO TBI
Left ABI: 0.33
Right ABI: 0.76

## 2023-06-08 MED ORDER — CLOPIDOGREL BISULFATE 75 MG PO TABS
75.0000 mg | ORAL_TABLET | Freq: Every day | ORAL | 6 refills | Status: DC
Start: 2023-06-08 — End: 2023-06-10

## 2023-06-08 NOTE — H&P (View-Only) (Signed)
 [facility]  Patient name: Katie Hawkins MRN: [id number] DOB: November 11, 1950 Sex: female   REASON FOR VISIT:    Follow up  HISOTRY OF PRESENT ILLNESS:    Katie Hawkins is a 73 y.o. female who is status post left femoral to peroneal artery bypass graft with saphenous vein on 10/30/2021.  This was done for left toe gangrene.  She underwent amputation by podiatry on 11/02/2021.  Surveillance imaging revealed a stenosis within her bypass graft.  She went for angiography on 09/30/2022.  She was found to have tandem left femoral-popliteal bypass graft lesions, 50 and 90%.  Both were treated with a 4 mm drug-coated balloon.   She is walking at least half a mile every day.  She remains on aspirin  and statin.  She has stopped taking her Plavix    PAST MEDICAL HISTORY:   Past Medical History:  Diagnosis Date   Diabetes mellitus    History of blood transfusion    HTN (hypertension), benign 02/13/2012   Hypercholesterolemia    Hyperlipidemia    Hypertension    nodular lymphocyte predominate hodgkins lymphoma dx'd 10/2010   xrt comp 02/20/11   Peripheral vascular disease (HCC)      FAMILY HISTORY:   Family History  Problem Relation Age of Onset   Diabetes Father    Cancer Maternal Grandfather        lung   Cancer Paternal Grandfather        leukemia    SOCIAL HISTORY:   Social History   Tobacco Use   Smoking status: Never   Smokeless tobacco: Never  Substance Use Topics   Alcohol use: Yes    Alcohol/week: 0.0 standard drinks of alcohol    Comment: occasionally     ALLERGIES:   Allergies  Allergen Reactions   Linagliptin     didn't like how she felt on this     CURRENT MEDICATIONS:   Current Outpatient Medications  Medication Sig Dispense Refill   Accu-Chek FastClix Lancets MISC      acetaminophen  (TYLENOL ) 500 MG tablet Take 1,000 mg by mouth at bedtime as needed for moderate pain.     clopidogrel   (PLAVIX ) 75 MG tablet Take 1 tablet (75 mg total) by mouth daily. 30 tablet 11   ezetimibe  (ZETIA ) 10 MG tablet Take 10 mg by mouth daily.     glimepiride (AMARYL) 2 MG tablet Take 2 mg by mouth at bedtime.     glucose blood test strip 1 each by Other route as needed. Use as instructed. One Touch Delica Lancets used also.      Glycerin-Hypromellose-PEG 400 (DRY EYE RELIEF DROPS OP) Place 1 drop into both eyes daily as needed (dry eye).     lisinopril  (PRINIVIL ,ZESTRIL ) 10 MG tablet Take 10 mg by mouth daily.       metFORMIN  (GLUCOPHAGE -XR) 500 MG 24 hr tablet Take 500 mg by mouth 2 (two) times daily.     metoprolol  succinate (TOPROL -XL) 25 MG 24 hr tablet Take 25 mg by mouth daily.  0   Multiple Vitamins-Minerals (MULTIVITAMIN GUMMIES ADULT PO) Take 1 tablet by mouth daily.     pravastatin  (PRAVACHOL ) 40 MG tablet Take 40 mg by mouth Daily.      Current Facility-Administered Medications  Medication Dose Route Frequency Provider Last Rate Last Admin   mupirocin  ointment (BACTROBAN ) 2 %   Topical Daily McDonald, Juliene SAUNDERS, DPM        REVIEW OF SYSTEMS:   [X]  denotes  positive finding, [ ]  denotes negative finding Cardiac  Comments:  Chest pain or chest pressure:    Shortness of breath upon exertion:    Short of breath when lying flat:    Irregular heart rhythm:        Vascular    Pain in calf, thigh, or hip brought on by ambulation:    Pain in feet at night that wakes you up from your sleep:     Blood clot in your veins:    Leg swelling:         Pulmonary    Oxygen at home:    Productive cough:     Wheezing:         Neurologic    Sudden weakness in arms or legs:     Sudden numbness in arms or legs:     Sudden onset of difficulty speaking or slurred speech:    Temporary loss of vision in one eye:     Problems with dizziness:         Gastrointestinal    Blood in stool:     Vomited blood:         Genitourinary    Burning when urinating:     Blood in urine:        Psychiatric     Major depression:         Hematologic    Bleeding problems:    Problems with blood clotting too easily:        Skin    Rashes or ulcers:        Constitutional    Fever or chills:      PHYSICAL EXAM:   There were no vitals filed for this visit.  GENERAL: The patient is a well-nourished female, in no acute distress. The vital signs are documented above. CARDIAC: There is a regular rate and rhythm.  VASCULAR: Nonpalpable pedal pulse PULMONARY: Non-labored respirations MUSCULOSKELETAL: There are no major deformities or cyanosis. NEUROLOGIC: No focal weakness or paresthesias are detected. SKIN: There are no ulcers or rashes noted. PSYCHIATRIC: The patient has a normal affect.  STUDIES:   I have reviewed the following: +-------+-----------+-----------+------------+------------+  ABI/TBIToday's ABIToday's TBIPrevious ABIPrevious TBI  +-------+-----------+-----------+------------+------------+  Right 0.76       0.25       0.73        0.61          +-------+-----------+-----------+------------+------------+  Left  0.33       absent     0.99        0.50          +-------+-----------+-----------+------------+------------+   Left: Femoral to peroneal bypass graft appears patent with abnormal high  resistant flow in the proximal to mid segment.  Unable to identify graft in the distal segment and distal anastomosis,  likely due to occlusion.  Left ABI is significantly decreased since last exam.     MEDICAL ISSUES:   PAD: The patient is status post femoral peroneal bypass graft.  She has previously undergone intervention.  She is back today for follow-up without symptoms however she has had a dramatic drop in her ABIs from normal down to 0.33.  There is the possibility of a distal occlusion.  I am concerned that her bypass graft is threatened and that if we do not intervene urgently, it may not be able to be salvaged.  Therefore I am recommending that we proceed  with angiography via right femoral approach tomorrow.  Hopefully she has percutaneous  options.  I am refilling her Plavix  today.    Malvina Serene CLORE, MD, FACS [facility] [phone] Pager [phone]

## 2023-06-08 NOTE — Progress Notes (Signed)
 [facility]  Patient name: Katie Hawkins MRN: [id number] DOB: November 11, 1950 Sex: female   REASON FOR VISIT:    Follow up  HISOTRY OF PRESENT ILLNESS:    Katie Hawkins is a 73 y.o. female who is status post left femoral to peroneal artery bypass graft with saphenous vein on 10/30/2021.  This was done for left toe gangrene.  She underwent amputation by podiatry on 11/02/2021.  Surveillance imaging revealed a stenosis within her bypass graft.  She went for angiography on 09/30/2022.  She was found to have tandem left femoral-popliteal bypass graft lesions, 50 and 90%.  Both were treated with a 4 mm drug-coated balloon.   She is walking at least half a mile every day.  She remains on aspirin  and statin.  She has stopped taking her Plavix    PAST MEDICAL HISTORY:   Past Medical History:  Diagnosis Date   Diabetes mellitus    History of blood transfusion    HTN (hypertension), benign 02/13/2012   Hypercholesterolemia    Hyperlipidemia    Hypertension    nodular lymphocyte predominate hodgkins lymphoma dx'd 10/2010   xrt comp 02/20/11   Peripheral vascular disease (HCC)      FAMILY HISTORY:   Family History  Problem Relation Age of Onset   Diabetes Father    Cancer Maternal Grandfather        lung   Cancer Paternal Grandfather        leukemia    SOCIAL HISTORY:   Social History   Tobacco Use   Smoking status: Never   Smokeless tobacco: Never  Substance Use Topics   Alcohol use: Yes    Alcohol/week: 0.0 standard drinks of alcohol    Comment: occasionally     ALLERGIES:   Allergies  Allergen Reactions   Linagliptin     didn't like how she felt on this     CURRENT MEDICATIONS:   Current Outpatient Medications  Medication Sig Dispense Refill   Accu-Chek FastClix Lancets MISC      acetaminophen  (TYLENOL ) 500 MG tablet Take 1,000 mg by mouth at bedtime as needed for moderate pain.     clopidogrel   (PLAVIX ) 75 MG tablet Take 1 tablet (75 mg total) by mouth daily. 30 tablet 11   ezetimibe  (ZETIA ) 10 MG tablet Take 10 mg by mouth daily.     glimepiride (AMARYL) 2 MG tablet Take 2 mg by mouth at bedtime.     glucose blood test strip 1 each by Other route as needed. Use as instructed. One Touch Delica Lancets used also.      Glycerin-Hypromellose-PEG 400 (DRY EYE RELIEF DROPS OP) Place 1 drop into both eyes daily as needed (dry eye).     lisinopril  (PRINIVIL ,ZESTRIL ) 10 MG tablet Take 10 mg by mouth daily.       metFORMIN  (GLUCOPHAGE -XR) 500 MG 24 hr tablet Take 500 mg by mouth 2 (two) times daily.     metoprolol  succinate (TOPROL -XL) 25 MG 24 hr tablet Take 25 mg by mouth daily.  0   Multiple Vitamins-Minerals (MULTIVITAMIN GUMMIES ADULT PO) Take 1 tablet by mouth daily.     pravastatin  (PRAVACHOL ) 40 MG tablet Take 40 mg by mouth Daily.      Current Facility-Administered Medications  Medication Dose Route Frequency Provider Last Rate Last Admin   mupirocin  ointment (BACTROBAN ) 2 %   Topical Daily McDonald, Juliene SAUNDERS, DPM        REVIEW OF SYSTEMS:   [X]  denotes  positive finding, [ ]  denotes negative finding Cardiac  Comments:  Chest pain or chest pressure:    Shortness of breath upon exertion:    Short of breath when lying flat:    Irregular heart rhythm:        Vascular    Pain in calf, thigh, or hip brought on by ambulation:    Pain in feet at night that wakes you up from your sleep:     Blood clot in your veins:    Leg swelling:         Pulmonary    Oxygen at home:    Productive cough:     Wheezing:         Neurologic    Sudden weakness in arms or legs:     Sudden numbness in arms or legs:     Sudden onset of difficulty speaking or slurred speech:    Temporary loss of vision in one eye:     Problems with dizziness:         Gastrointestinal    Blood in stool:     Vomited blood:         Genitourinary    Burning when urinating:     Blood in urine:        Psychiatric     Major depression:         Hematologic    Bleeding problems:    Problems with blood clotting too easily:        Skin    Rashes or ulcers:        Constitutional    Fever or chills:      PHYSICAL EXAM:   There were no vitals filed for this visit.  GENERAL: The patient is a well-nourished female, in no acute distress. The vital signs are documented above. CARDIAC: There is a regular rate and rhythm.  VASCULAR: Nonpalpable pedal pulse PULMONARY: Non-labored respirations MUSCULOSKELETAL: There are no major deformities or cyanosis. NEUROLOGIC: No focal weakness or paresthesias are detected. SKIN: There are no ulcers or rashes noted. PSYCHIATRIC: The patient has a normal affect.  STUDIES:   I have reviewed the following: +-------+-----------+-----------+------------+------------+  ABI/TBIToday's ABIToday's TBIPrevious ABIPrevious TBI  +-------+-----------+-----------+------------+------------+  Right 0.76       0.25       0.73        0.61          +-------+-----------+-----------+------------+------------+  Left  0.33       absent     0.99        0.50          +-------+-----------+-----------+------------+------------+   Left: Femoral to peroneal bypass graft appears patent with abnormal high  resistant flow in the proximal to mid segment.  Unable to identify graft in the distal segment and distal anastomosis,  likely due to occlusion.  Left ABI is significantly decreased since last exam.     MEDICAL ISSUES:   PAD: The patient is status post femoral peroneal bypass graft.  She has previously undergone intervention.  She is back today for follow-up without symptoms however she has had a dramatic drop in her ABIs from normal down to 0.33.  There is the possibility of a distal occlusion.  I am concerned that her bypass graft is threatened and that if we do not intervene urgently, it may not be able to be salvaged.  Therefore I am recommending that we proceed  with angiography via right femoral approach tomorrow.  Hopefully she has percutaneous  options.  I am refilling her Plavix  today.    Malvina Serene CLORE, MD, FACS [facility] [phone] Pager [phone]

## 2023-06-09 ENCOUNTER — Inpatient Hospital Stay (HOSPITAL_COMMUNITY)
Admission: RE | Admit: 2023-06-09 | Discharge: 2023-06-10 | DRG: 270 | Disposition: A | Discharge status: Home / Self Care | Payer: Medicare HMO | Attending: Surgery | Admitting: Surgery

## 2023-06-09 ENCOUNTER — Encounter (HOSPITAL_COMMUNITY): Payer: Self-pay | Admitting: Surgery

## 2023-06-09 ENCOUNTER — Encounter (HOSPITAL_COMMUNITY)
Admission: RE | Disposition: A | Discharge status: Home / Self Care | Payer: Self-pay | Source: Ambulatory Visit | Attending: Surgery

## 2023-06-09 ENCOUNTER — Other Ambulatory Visit: Payer: Self-pay

## 2023-06-09 DIAGNOSIS — Z79899 Other long term (current) drug therapy: Secondary | ICD-10-CM | POA: Diagnosis not present

## 2023-06-09 DIAGNOSIS — E7800 Pure hypercholesterolemia, unspecified: Secondary | ICD-10-CM | POA: Diagnosis present

## 2023-06-09 DIAGNOSIS — I7777 Dissection of artery of lower extremity: Secondary | ICD-10-CM | POA: Diagnosis not present

## 2023-06-09 DIAGNOSIS — I739 Peripheral vascular disease, unspecified: Secondary | ICD-10-CM | POA: Diagnosis present

## 2023-06-09 DIAGNOSIS — Z9889 Other specified postprocedural states: Secondary | ICD-10-CM | POA: Diagnosis not present

## 2023-06-09 DIAGNOSIS — I10 Essential (primary) hypertension: Secondary | ICD-10-CM | POA: Diagnosis present

## 2023-06-09 DIAGNOSIS — Z7984 Long term (current) use of oral hypoglycemic drugs: Secondary | ICD-10-CM | POA: Diagnosis not present

## 2023-06-09 DIAGNOSIS — Z888 Allergy status to other drugs, medicaments and biological substances status: Secondary | ICD-10-CM | POA: Diagnosis not present

## 2023-06-09 DIAGNOSIS — T82868A Thrombosis of vascular prosthetic devices, implants and grafts, initial encounter: Secondary | ICD-10-CM | POA: Diagnosis not present

## 2023-06-09 DIAGNOSIS — E1151 Type 2 diabetes mellitus with diabetic peripheral angiopathy without gangrene: Principal | ICD-10-CM | POA: Diagnosis present

## 2023-06-09 DIAGNOSIS — Z7902 Long term (current) use of antithrombotics/antiplatelets: Secondary | ICD-10-CM | POA: Diagnosis not present

## 2023-06-09 DIAGNOSIS — Z833 Family history of diabetes mellitus: Secondary | ICD-10-CM | POA: Diagnosis not present

## 2023-06-09 HISTORY — PX: PERIPHERAL VASCULAR ATHERECTOMY: CATH118256

## 2023-06-09 HISTORY — PX: ABDOMINAL AORTOGRAM W/LOWER EXTREMITY: CATH118223

## 2023-06-09 HISTORY — PX: PERIPHERAL VASCULAR THROMBECTOMY: CATH118306

## 2023-06-09 LAB — CBC
HCT: 37.6 % (ref 36.0–46.0)
HCT: 36.2 % (ref 36.0–46.0)
HCT: 35.3 % — ABNORMAL LOW (ref 36.0–46.0)
Hemoglobin: 12.2 g/dL (ref 12.0–15.0)
Hemoglobin: 11.8 g/dL — ABNORMAL LOW (ref 12.0–15.0)
Hemoglobin: 11.6 g/dL — ABNORMAL LOW (ref 12.0–15.0)
MCH: 29 pg (ref 26.0–34.0)
MCH: 28.9 pg (ref 26.0–34.0)
MCH: 29.6 pg (ref 26.0–34.0)
MCHC: 32.4 g/dL (ref 30.0–36.0)
MCHC: 32.6 g/dL (ref 30.0–36.0)
MCHC: 32.9 g/dL (ref 30.0–36.0)
MCV: 89.3 fL (ref 80.0–100.0)
MCV: 88.7 fL (ref 80.0–100.0)
MCV: 90.1 fL (ref 80.0–100.0)
Platelets: 298 10*3/uL (ref 150–400)
Platelets: 287 10*3/uL (ref 150–400)
Platelets: 260 10*3/uL (ref 150–400)
RBC: 4.21 MIL/uL (ref 3.87–5.11)
RBC: 4.08 MIL/uL (ref 3.87–5.11)
RBC: 3.92 MIL/uL (ref 3.87–5.11)
RDW: 14.4 % (ref 11.5–15.5)
RDW: 14.5 % (ref 11.5–15.5)
RDW: 14.6 % (ref 11.5–15.5)
WBC: 9.8 10*3/uL (ref 4.0–10.5)
WBC: 11 10*3/uL — ABNORMAL HIGH (ref 4.0–10.5)
WBC: 10.5 10*3/uL (ref 4.0–10.5)
nRBC: 0 % (ref 0.0–0.2)
nRBC: 0 % (ref 0.0–0.2)
nRBC: 0 % (ref 0.0–0.2)

## 2023-06-09 LAB — POCT I-STAT, CHEM 8
BUN: 18 mg/dL (ref 8–23)
Calcium, Ion: 1.28 mmol/L (ref 1.15–1.40)
Chloride: 101 mmol/L (ref 98–111)
Creatinine, Ser: 0.6 mg/dL (ref 0.44–1.00)
Glucose, Bld: 211 mg/dL — ABNORMAL HIGH (ref 70–99)
HCT: 41 % (ref 36.0–46.0)
Hemoglobin: 13.9 g/dL (ref 12.0–15.0)
Potassium: 3.6 mmol/L (ref 3.5–5.1)
Sodium: 138 mmol/L (ref 135–145)
TCO2: 24 mmol/L (ref 22–32)

## 2023-06-09 LAB — FIBRINOGEN
Fibrinogen: 392 mg/dL (ref 210–475)
Fibrinogen: 372 mg/dL (ref 210–475)
Fibrinogen: 339 mg/dL (ref 210–475)

## 2023-06-09 LAB — MRSA NEXT GEN BY PCR, NASAL: MRSA by PCR Next Gen: NOT DETECTED

## 2023-06-09 LAB — HEPARIN LEVEL (UNFRACTIONATED)
Heparin Unfractionated: 0.68 [IU]/mL (ref 0.30–0.70)
Heparin Unfractionated: 0.47 [IU]/mL (ref 0.30–0.70)
Heparin Unfractionated: 0.15 [IU]/mL — ABNORMAL LOW (ref 0.30–0.70)

## 2023-06-09 SURGERY — ABDOMINAL AORTOGRAM W/LOWER EXTREMITY
Anesthesia: LOCAL

## 2023-06-09 MED ORDER — SODIUM CHLORIDE 0.9% FLUSH: 3.0000 mL | INTRAVENOUS | Status: DC | PRN

## 2023-06-09 MED ORDER — SODIUM CHLORIDE 0.9 % IV SOLN: INTRAVENOUS | Status: DC

## 2023-06-09 MED ORDER — IODIXANOL 320 MG/ML IV SOLN
INTRAVENOUS | Status: DC | PRN
  Administered 2023-06-09: 60 mL

## 2023-06-09 MED ORDER — HEPARIN (PORCINE) 25000 UT/250ML-% IV SOLN
1050.0000 [IU]/h | INTRAVENOUS | Status: DC
  Administered 2023-06-09: 800 [IU]/h via INTRAVENOUS
  Administered 2023-06-10: 900 [IU]/h via INTRAVENOUS
  Filled 2023-06-09 (×2): qty 250

## 2023-06-09 MED ORDER — HEPARIN (PORCINE) IN NACL 1000-0.9 UT/500ML-% IV SOLN
INTRAVENOUS | Status: DC | PRN
  Administered 2023-06-09 (×2): 500 mL

## 2023-06-09 MED ORDER — METOPROLOL TARTRATE 5 MG/5ML IV SOLN
5.0000 mg | Freq: Four times a day (QID) | INTRAVENOUS | Status: DC
  Administered 2023-06-10 (×3): 5 mg via INTRAVENOUS
  Filled 2023-06-09 (×4): qty 5

## 2023-06-09 MED ORDER — SODIUM CHLORIDE 0.9% FLUSH
3.0000 mL | Freq: Two times a day (BID) | INTRAVENOUS | Status: DC
  Administered 2023-06-09 – 2023-06-10 (×3): 3 mL via INTRAVENOUS

## 2023-06-09 MED ORDER — CHLORHEXIDINE GLUCONATE CLOTH 2 % EX PADS
6.0000 | MEDICATED_PAD | Freq: Every day | CUTANEOUS | Status: DC
  Administered 2023-06-09: 6 via TOPICAL

## 2023-06-09 MED ORDER — SODIUM CHLORIDE 0.9 % IV SOLN
INTRAVENOUS | Status: AC | PRN
  Administered 2023-06-09: 10 mL/h via INTRAVENOUS

## 2023-06-09 MED ORDER — ALTEPLASE 1 MG/ML SYRINGE FOR VASCULAR PROCEDURE
10.0000 mg | Freq: Once | INTRAMUSCULAR | Status: AC
  Administered 2023-06-09: 10 mg via INTRA_ARTERIAL
  Filled 2023-06-09: qty 10

## 2023-06-09 MED ORDER — MIDAZOLAM HCL 2 MG/2ML IJ SOLN
INTRAMUSCULAR | Status: AC
  Filled 2023-06-09: qty 2

## 2023-06-09 MED ORDER — LABETALOL HCL 5 MG/ML IV SOLN: 10.0000 mg | INTRAVENOUS | Status: DC | PRN

## 2023-06-09 MED ORDER — MIDAZOLAM HCL 2 MG/2ML IJ SOLN: 1.0000 mg | INTRAMUSCULAR | Status: DC | PRN

## 2023-06-09 MED ORDER — FENTANYL CITRATE (PF) 100 MCG/2ML IJ SOLN
INTRAMUSCULAR | Status: AC
  Filled 2023-06-09: qty 2

## 2023-06-09 MED ORDER — LIDOCAINE HCL (PF) 1 % IJ SOLN
INTRAMUSCULAR | Status: DC | PRN
  Administered 2023-06-09: 15 mL via INTRADERMAL

## 2023-06-09 MED ORDER — ORAL CARE MOUTH RINSE: 15.0000 mL | OROMUCOSAL | Status: DC | PRN

## 2023-06-09 MED ORDER — SODIUM CHLORIDE 0.9 % IV SOLN
1.0000 mg/h | INTRAVENOUS | Status: DC
  Administered 2023-06-09 – 2023-06-10 (×4): 1 mg/h
  Filled 2023-06-09 (×8): qty 10

## 2023-06-09 MED ORDER — SODIUM CHLORIDE 0.9 % IV SOLN: 250.0000 mL | INTRAVENOUS | Status: AC | PRN

## 2023-06-09 MED ORDER — MIDAZOLAM HCL 2 MG/2ML IJ SOLN
INTRAMUSCULAR | Status: DC | PRN
  Administered 2023-06-09: 2 mg via INTRAVENOUS
  Administered 2023-06-09 (×2): 1 mg via INTRAVENOUS

## 2023-06-09 MED ORDER — HEPARIN SODIUM (PORCINE) 1000 UNIT/ML IJ SOLN
INTRAMUSCULAR | Status: DC | PRN
  Administered 2023-06-09: 8000 [IU] via INTRAVENOUS
  Administered 2023-06-09: 2000 [IU] via INTRAVENOUS

## 2023-06-09 MED ORDER — LIDOCAINE HCL (PF) 1 % IJ SOLN
INTRAMUSCULAR | Status: AC
  Filled 2023-06-09: qty 30

## 2023-06-09 MED ORDER — HYDRALAZINE HCL 20 MG/ML IJ SOLN
5.0000 mg | INTRAMUSCULAR | Status: DC | PRN
Start: 2023-06-09 — End: 2023-06-11

## 2023-06-09 MED ORDER — MUPIROCIN 2 % EX OINT
1.0000 | TOPICAL_OINTMENT | Freq: Two times a day (BID) | CUTANEOUS | Status: DC
  Administered 2023-06-09 – 2023-06-10 (×2): 1 via NASAL

## 2023-06-09 MED ORDER — MORPHINE SULFATE (PF) 2 MG/ML IV SOLN
5.0000 mg | INTRAVENOUS | Status: DC | PRN
Start: 2023-06-09 — End: 2023-06-11
  Administered 2023-06-10: 5 mg via INTRAVENOUS
  Filled 2023-06-09: qty 3

## 2023-06-09 MED ORDER — FENTANYL CITRATE (PF) 100 MCG/2ML IJ SOLN
INTRAMUSCULAR | Status: DC | PRN
  Administered 2023-06-09 (×2): 25 ug via INTRAVENOUS
  Administered 2023-06-09: 50 ug via INTRAVENOUS

## 2023-06-09 MED ORDER — ONDANSETRON HCL 4 MG/2ML IJ SOLN: 4.0000 mg | Freq: Four times a day (QID) | INTRAMUSCULAR | Status: DC | PRN

## 2023-06-09 MED ORDER — HEPARIN SODIUM (PORCINE) 1000 UNIT/ML IJ SOLN
INTRAMUSCULAR | Status: AC
  Filled 2023-06-09: qty 10

## 2023-06-09 SURGICAL SUPPLY — 25 items
CANISTER PENUMBRA ENGINE (MISCELLANEOUS) IMPLANT
CATH AURYON ATHERECTOMY 2.35 (CATHETERS) IMPLANT
CATH INFUS 135X50 (CATHETERS) IMPLANT
CATH LIGHTNING 7 XTORQ 130 (CATHETERS) IMPLANT
CATH NAVICROSS ST .035X90CM (MICROCATHETER) IMPLANT
CATH OMNI FLUSH 5F 65CM (CATHETERS) IMPLANT
COVER DOME SNAP 22 D (MISCELLANEOUS) IMPLANT
DEVICE TORQUE SEADRAGON GRN (MISCELLANEOUS) IMPLANT
GLIDEWIRE ADV .035X260CM (WIRE) IMPLANT
GUIDEWIRE ANGLED .035X150CM (WIRE) IMPLANT
GUIDEWIRE ANGLED .035X260CM (WIRE) IMPLANT
KIT MICROPUNCTURE NIT STIFF (SHEATH) IMPLANT
KIT PV (KITS) ×2 IMPLANT
KIT SYRINGE INJ CVI SPIKEX1 (MISCELLANEOUS) IMPLANT
MAT PREVALON FULL STRYKER (MISCELLANEOUS) IMPLANT
SET ATX-X65L (MISCELLANEOUS) IMPLANT
SHEATH CATAPULT 7FR 45 (SHEATH) IMPLANT
SHEATH PINNACLE 5F 10CM (SHEATH) IMPLANT
SHEATH PINNACLE 7F 10CM (SHEATH) IMPLANT
SYR MEDRAD MARK 7 150ML (SYRINGE) ×2 IMPLANT
TRANSDUCER W/STOPCOCK (MISCELLANEOUS) ×2 IMPLANT
TRAY PV CATH (CUSTOM PROCEDURE TRAY) ×2 IMPLANT
WIRE BENTSON .035X145CM (WIRE) IMPLANT
WIRE ROSEN-J .035X260CM (WIRE) IMPLANT
WIRE SPARTACORE .014X300CM (WIRE) IMPLANT

## 2023-06-09 NOTE — Progress Notes (Signed)
 PHARMACY - ANTICOAGULATION CONSULT NOTE  Pharmacy Consult for heparin   Indication:  Occluded graft   Allergies  Allergen Reactions   Linagliptin     didn't like how she felt on this    Patient Measurements: Height: 5' 4 (162.6 cm) Weight: 76.7 kg (169 lb) IBW/kg (Calculated) : 54.7  Vital Signs: Temp: 97.6 F (36.4 C) (01/14 1215) Temp Source: Oral (01/14 1215) BP: 112/51 (01/14 1530) Pulse Rate: 86 (01/14 1530)  Labs: Recent Labs    06/09/23 0532 06/09/23 1341 06/09/23 1508  HGB 13.9 12.2 11.8*  HCT 41.0 37.6 36.2  PLT  --  298 287  HEPARINUNFRC  --  0.68 0.47  CREATININE 0.60  --   --     Estimated Creatinine Clearance: 62.8 mL/min (by C-G formula based on SCr of 0.6 mg/dL).   Medical History: Past Medical History:  Diagnosis Date   Diabetes mellitus    History of blood transfusion    HTN (hypertension), benign 02/13/2012   Hypercholesterolemia    Hyperlipidemia    Hypertension    nodular lymphocyte predominate hodgkins lymphoma dx'd 10/2010   xrt comp 02/20/11   Peripheral vascular disease (HCC)      Assessment: 72yof with Hx femoral bybass graft occlusion s/p angiography today  Now on heparin  drip 800 uts/hr with heparin  level 0.47 at goal, alteplase  1mg /hr with cbc and fibrinogen  stable   Goal of Therapy:  Heparin  level 0.2-0.5 units/ml Monitor platelets by anticoagulation protocol: Yes   Plan:  Continue heparin  drip 800 uts/hr Alteplase  1mg /hr  Q6hr cbc, fibrinogen , heparin  level  Monitor s/s bleeding    Olam Chalk Pharm.D. CPP, BCPS Clinical Pharmacist [phone] 06/09/2023 4:16 PM

## 2023-06-09 NOTE — Progress Notes (Signed)
 PHARMACY - ANTICOAGULATION CONSULT NOTE  Pharmacy Consult for heparin   Indication:  Occluded graft   Allergies  Allergen Reactions   Linagliptin     didn't like how she felt on this    Patient Measurements: Height: 5' 4 (162.6 cm) Weight: 76.7 kg (169 lb) IBW/kg (Calculated) : 54.7  Vital Signs: Temp: 98.3 F (36.8 C) (01/14 1900) Temp Source: Oral (01/14 1900) BP: 110/49 (01/14 2200) Pulse Rate: 92 (01/14 2200)  Labs: Recent Labs    06/09/23 0532 06/09/23 1341 06/09/23 1508 06/09/23 2207  HGB 13.9 12.2 11.8* 11.6*  HCT 41.0 37.6 36.2 35.3*  PLT  --  298 287 260  HEPARINUNFRC  --  0.68 0.47 0.15*  CREATININE 0.60  --   --   --     Estimated Creatinine Clearance: 62.8 mL/min (by C-G formula based on SCr of 0.6 mg/dL).   Medical History: Past Medical History:  Diagnosis Date   Diabetes mellitus    History of blood transfusion    HTN (hypertension), benign 02/13/2012   Hypercholesterolemia    Hyperlipidemia    Hypertension    nodular lymphocyte predominate hodgkins lymphoma dx'd 10/2010   xrt comp 02/20/11   Peripheral vascular disease (HCC)      Assessment: 72yof with Hx femoral bybass graft occlusion s/p mechanical thrombectomy, angiography, intra-arterial TPA 1/14 but without resolution of thrombus. Pharmacy consulted for heparin   Heparin  level 0.15 is subtherapeutic on 800 units/hr. Fibrinogen  wnl stable.  CBC stable. Planning return to OR 1/15. Alteplase  running at 1mg /hr.   No issues with infusion or bleeding per RN.  Goal of Therapy:  Heparin  level 0.2-0.5 units/ml Monitor platelets by anticoagulation protocol: Yes   Plan:  Increase heparin  to 900 uts/hr Alteplase  1mg /hr  Q6hr cbc, fibrinogen , heparin  level  Monitor s/s bleeding   Jinnie Door, PharmD, BCPS, BCCP Clinical Pharmacist  Please check AMION for all [facility] Pharmacy phone numbers After 10:00 PM, call Main Pharmacy [phone]

## 2023-06-09 NOTE — Interval H&P Note (Signed)
 History and Physical Interval Note:  06/09/2023 7:52 AM  Katie Hawkins  has presented today for surgery, with the diagnosis of pad.  The various methods of treatment have been discussed with the patient and family. After consideration of risks, benefits and other options for treatment, the patient has consented to  Procedure(s): ABDOMINAL AORTOGRAM W/LOWER EXTREMITY (N/A) as a surgical intervention.  The patient's history has been reviewed, patient examined, no change in status, stable for surgery.  I have reviewed the patient's chart and labs.  Questions were answered to the patient's satisfaction.     Malvina New

## 2023-06-09 NOTE — Op Note (Signed)
 Patient name: Katie Hawkins MRN: [id number] DOB: 07/02/1950 Sex: female  06/09/2023 Pre-operative Diagnosis: Left bypass graft occlusion Post-operative diagnosis:  Same Surgeon:  Malvina New Procedure Performed:  1.  Ultrasound-guided access, right femoral artery  2.  Abdominal aortogram  3.  Left lower extremity runoff  4.  Selective injection with catheter in left femoral tibial bypass  5.  Mechanical thrombectomy, left femoral tibial bypass  6.  Laser atherectomy, left femoral tibial bypass  7.  Intra-arterial administration of tPA  8.  Placement of catheter for thrombolytic therapy  9.  Conscious sedation, 90 minutes   Indications: This is a 73 year old female who is status post left femoral to peroneal bypass graft with vein for a gangrenous toe.  She has healed her toe and is ambulating very well.  She went for angiography in May 2024 that showed stenosis within the distal portion of the bypass graft.  She came in yesterday for ultrasound surveillance that showed possible bypass graft occlusion.  She comes in for angiography today.  Procedure:  The patient was identified in the holding area and taken to room 8.  The patient was then placed supine on the table and prepped and draped in the usual sterile fashion.  A time out was called.  Conscious sedation was administered with the use of IV fentanyl  and Versed  under continuous physician and nurse monitoring.  Heart rate, blood pressure, and oxygen saturation were continuously monitored.  Total sedation time was 90 minutes.  Ultrasound was used to evaluate the right common femoral artery.  It was patent .  A digital ultrasound image was acquired.  A micropuncture needle was used to access the right common femoral artery under ultrasound guidance.  An 018 wire was advanced without resistance and a micropuncture sheath was placed.  The 018 wire was removed and a benson wire was placed.  The micropuncture sheath was exchanged for a 5  french sheath.  An omniflush catheter was advanced over the wire to the level of L-1.  An abdominal angiogram was obtained.  Next, using the omniflush catheter and a benson wire, the aortic bifurcation was crossed and the catheter was placed into theleft external iliac artery and left runoff was obtained.   Findings:   Aortogram: No significant renal artery stenosis was visualized.  The infrarenal abdominal aorta is widely patent.  Bilateral common and external iliac arteries are without significant stenosis.  Right Lower Extremity: Not evaluated  Left Lower Extremity: Left common femoral and profundofemoral artery are widely patent.  The bypass graft opacifies proximally but has not seen past the lower leg.  The peroneal artery is a dominant runoff  Intervention: After the above images were acquired the decision was made to proceed with intervention.  A 7 French 45 cm sheath was advanced over the aortic bifurcation and placed in the left common femoral artery.  The patient was fully heparinized.  Using a Glidewire, I selected the bypass graft and advanced a glide catheter into the graft.  Contrast injections were performed with a catheter in the bypass graft confirmed minimal opacification distally.  I then switched out for a 014 wire.  I selected a 2.3 laser catheter and perform laser atherectomy with the suction portion on it to try and address some of the previously stenotic lesions within the bypass graft.  I was unable to advance the laser catheter as far as I want to.  I was concerned that I could potentially dislodge clot into her runoff.  I elected to stop the laser portion of the procedure.  I then inserted the penumbra CAT 7 and perform mechanical thrombectomy of the bypass graft.  I was unable to get across the knee with the penumbra catheter.  I then injected 2 mg of tPA through the catheter to see if this would soften up the clot and did not have much success with this.  I then used a Glidewire  to navigate across the distal anastomosis.  At this point elected to place a catheter for overnight thrombolytics.  Of note, I was unable to advance the infusion catheter across the distal anastomosis in similar position just below the joint space in the bypass graft.  Impression:  #1  Occluded left and peroneal bypass graft  #2  Attempted laser atherectomy and mechanical thrombectomy of the bypass graft without resolution of the thrombus and so a catheter was placed for overnight administration of thrombolytic therapy  #3  Patient be brought back tomorrow for follow-up.  #4  If her bypass graft has not improved significantly overnight, I would not recommend aggressive attempts to salvage the graft   V. Malvina New, M.D., [facility] [place] Office: [phone] Pager:  [phone]

## 2023-06-10 ENCOUNTER — Encounter (HOSPITAL_COMMUNITY): Payer: Self-pay | Admitting: Surgery

## 2023-06-10 ENCOUNTER — Encounter (HOSPITAL_COMMUNITY)
Admission: RE | Disposition: A | Discharge status: Home / Self Care | Payer: Self-pay | Source: Ambulatory Visit | Attending: Surgery

## 2023-06-10 DIAGNOSIS — Z9889 Other specified postprocedural states: Secondary | ICD-10-CM

## 2023-06-10 DIAGNOSIS — I7777 Dissection of artery of lower extremity: Secondary | ICD-10-CM

## 2023-06-10 DIAGNOSIS — T82868A Thrombosis of vascular prosthetic devices, implants and grafts, initial encounter: Secondary | ICD-10-CM | POA: Diagnosis not present

## 2023-06-10 HISTORY — PX: PERIPHERAL VASCULAR THROMBECTOMY: CATH118306

## 2023-06-10 HISTORY — PX: PERIPHERAL VASCULAR BALLOON ANGIOPLASTY: CATH118281

## 2023-06-10 LAB — POCT ACTIVATED CLOTTING TIME
Activated Clotting Time: 216 s
Activated Clotting Time: 227 s
Activated Clotting Time: 251 s

## 2023-06-10 LAB — BASIC METABOLIC PANEL
Anion gap: 11 (ref 5–15)
BUN: 12 mg/dL (ref 8–23)
CO2: 23 mmol/L (ref 22–32)
Calcium: 9 mg/dL (ref 8.9–10.3)
Chloride: 104 mmol/L (ref 98–111)
Creatinine, Ser: 0.62 mg/dL (ref 0.44–1.00)
GFR, Estimated: >60 mL/min (ref >60)
Glucose, Bld: 185 mg/dL — ABNORMAL HIGH (ref 70–99)
Potassium: 3.8 mmol/L (ref 3.5–5.1)
Sodium: 138 mmol/L (ref 135–145)

## 2023-06-10 LAB — CBC
HCT: 35 % — ABNORMAL LOW (ref 36.0–46.0)
Hemoglobin: 11.3 g/dL — ABNORMAL LOW (ref 12.0–15.0)
MCH: 28.8 pg (ref 26.0–34.0)
MCHC: 32.3 g/dL (ref 30.0–36.0)
MCV: 89.1 fL (ref 80.0–100.0)
Platelets: 240 10*3/uL (ref 150–400)
RBC: 3.93 MIL/uL (ref 3.87–5.11)
RDW: 14.4 % (ref 11.5–15.5)
WBC: 10.2 10*3/uL (ref 4.0–10.5)
nRBC: 0 % (ref 0.0–0.2)

## 2023-06-10 LAB — FIBRINOGEN: Fibrinogen: 305 mg/dL (ref 210–475)

## 2023-06-10 LAB — HEPARIN LEVEL (UNFRACTIONATED): Heparin Unfractionated: 0.16 [IU]/mL — ABNORMAL LOW (ref 0.30–0.70)

## 2023-06-10 SURGERY — PERIPHERAL VASCULAR THROMBECTOMY
Anesthesia: LOCAL | Laterality: Left

## 2023-06-10 MED ORDER — APIXABAN 5 MG PO TABS: 5.0000 mg | ORAL_TABLET | Freq: Two times a day (BID) | ORAL | 0 refills | Status: DC

## 2023-06-10 MED ORDER — SODIUM CHLORIDE 0.9 % IV SOLN
INTRAVENOUS | Status: AC | PRN
  Administered 2023-06-10: 10 mL/h via INTRAVENOUS

## 2023-06-10 MED ORDER — SODIUM CHLORIDE 0.9% FLUSH: 3.0000 mL | INTRAVENOUS | Status: DC | PRN

## 2023-06-10 MED ORDER — APIXABAN 5 MG PO TABS: 5.0000 mg | ORAL_TABLET | Freq: Two times a day (BID) | ORAL | 12 refills | Status: DC

## 2023-06-10 MED ORDER — APIXABAN (ELIQUIS) EDUCATION KIT FOR DVT/PE PATIENTS
PACK | Freq: Once | Status: AC
  Filled 2023-06-10: qty 1

## 2023-06-10 MED ORDER — APIXABAN 5 MG PO TABS
5.0000 mg | ORAL_TABLET | Freq: Two times a day (BID) | ORAL | Status: DC
Start: 2023-06-17 — End: 2023-06-11

## 2023-06-10 MED ORDER — APIXABAN 5 MG PO TABS
ORAL_TABLET | ORAL | 0 refills | Status: AC
Start: 2023-06-10 — End: ?

## 2023-06-10 MED ORDER — HEPARIN SODIUM (PORCINE) 1000 UNIT/ML IJ SOLN
INTRAMUSCULAR | Status: DC | PRN
  Administered 2023-06-10 (×2): 5000 [IU] via INTRAVENOUS

## 2023-06-10 MED ORDER — ASPIRIN 81 MG PO TBEC: 81.0000 mg | DELAYED_RELEASE_TABLET | Freq: Every day | ORAL | 12 refills | Status: AC

## 2023-06-10 MED ORDER — LIDOCAINE HCL (PF) 1 % IJ SOLN
INTRAMUSCULAR | Status: DC | PRN
  Administered 2023-06-10: 10 mL

## 2023-06-10 MED ORDER — SODIUM CHLORIDE 0.9 % IV SOLN: 250.0000 mL | INTRAVENOUS | Status: DC | PRN

## 2023-06-10 MED ORDER — SODIUM CHLORIDE 0.9 % WEIGHT BASED INFUSION
1.0000 mL/kg/h | INTRAVENOUS | Status: DC
  Administered 2023-06-10: 1 mL/kg/h via INTRAVENOUS

## 2023-06-10 MED ORDER — APIXABAN 5 MG PO TABS
ORAL_TABLET | ORAL | 0 refills | Status: DC
Start: 2023-06-10 — End: 2023-06-10

## 2023-06-10 MED ORDER — APIXABAN 5 MG PO TABS
10.0000 mg | ORAL_TABLET | Freq: Two times a day (BID) | ORAL | Status: DC
  Administered 2023-06-10: 10 mg via ORAL
  Filled 2023-06-10: qty 2

## 2023-06-10 MED ORDER — IODIXANOL 320 MG/ML IV SOLN
INTRAVENOUS | Status: DC | PRN
  Administered 2023-06-10: 90 mL

## 2023-06-10 MED ORDER — HEPARIN SODIUM (PORCINE) 1000 UNIT/ML IJ SOLN
INTRAMUSCULAR | Status: AC
  Filled 2023-06-10: qty 10

## 2023-06-10 MED ORDER — SODIUM CHLORIDE 0.9% FLUSH: 3.0000 mL | Freq: Two times a day (BID) | INTRAVENOUS | Status: DC

## 2023-06-10 MED ORDER — APIXABAN 5 MG PO TABS: 5.0000 mg | ORAL_TABLET | Freq: Two times a day (BID) | ORAL | 11 refills | Status: DC

## 2023-06-10 MED ORDER — ASPIRIN 81 MG PO TBEC
81.0000 mg | DELAYED_RELEASE_TABLET | Freq: Every day | ORAL | Status: DC
  Administered 2023-06-10: 81 mg via ORAL
  Filled 2023-06-10: qty 1

## 2023-06-10 MED ORDER — APIXABAN 5 MG PO TABS: ORAL_TABLET | ORAL | 0 refills | Status: DC

## 2023-06-10 SURGICAL SUPPLY — 22 items
BALLN IN.PACT DCB 4X120 (BALLOONS) ×1
BALLN IN.PACT DCB 4X150 (BALLOONS) ×1
BALLN IN.PACT DCB 4X80 (BALLOONS) ×1
BALLN STERLI SL OTW 2.5X80X150 (BALLOONS) ×1
BALLOON STRLNG OTW 2.5X80X150 (BALLOONS) IMPLANT
CATH MUSTANG 3X120X135 (BALLOONS) IMPLANT
CATH MUSTANG 4X200X135 (BALLOONS) IMPLANT
CATH QUICKCROSS .035X135CM (MICROCATHETER) IMPLANT
CLOSURE PERCLOSE PROSTYLE (VASCULAR PRODUCTS) IMPLANT
COVER DOME SNAP 22 D (MISCELLANEOUS) IMPLANT
DCB IN.PACT 4X120 (BALLOONS) IMPLANT
DCB IN.PACT 4X150 (BALLOONS) IMPLANT
DCB IN.PACT 4X80 (BALLOONS) IMPLANT
DCB RANGER 4.0X200 150 (BALLOONS) IMPLANT
GLIDEWIRE ADV .035X260CM (WIRE) IMPLANT
KIT ENCORE 26 ADVANTAGE (KITS) IMPLANT
KIT MICROPUNCTURE NIT STIFF (SHEATH) IMPLANT
PROTECTION STATION PRESSURIZED (MISCELLANEOUS) ×1
RANGER DCB 4.0X200 150 (BALLOONS) ×1
STATION PROTECTION PRESSURIZED (MISCELLANEOUS) IMPLANT
TRAY PV CATH (CUSTOM PROCEDURE TRAY) IMPLANT
WIRE G V18X300CM (WIRE) IMPLANT

## 2023-06-10 NOTE — Progress Notes (Signed)
 PHARMACY - ANTICOAGULATION  Pharmacy Consult for heparin   Indication:  Occluded graft  Brief A/P: Heparin  level subtherapeutic Increase Heparin  rate  Allergies  Allergen Reactions   Linagliptin     didn't like how she felt on this    Patient Measurements: Height: 5\' 4"  (162.6 cm) Weight: 76.7 kg (169 lb) IBW/kg (Calculated) : 54.7  Vital Signs: Temp: 98.3 F (36.8 C) (01/14 1900) Temp Source: Oral (01/14 1900) BP: 114/57 (01/15 0400) Pulse Rate: 86 (01/15 0400)  Labs: Recent Labs    06/09/23 0532 06/09/23 1341 06/09/23 1508 06/09/23 2207 06/10/23 0344  HGB 13.9   < > 11.8* 11.6* 11.3*  HCT 41.0   < > 36.2 35.3* 35.0*  PLT  --    < > 287 260 240  HEPARINUNFRC  --    < > 0.47 0.15* 0.16*  CREATININE 0.60  --   --   --  0.62   < > = values in this interval not displayed.    Estimated Creatinine Clearance: 62.8 mL/min (by C-G formula based on SCr of 0.62 mg/dL).  Assessment: 73 yo female femoral bybass graft occlusion s/p thrombectomy and intra-arterial TPA for heparin   Goal of Therapy:  Heparin  level 0.2-0.5 units/ml Monitor platelets by anticoagulation protocol: Yes   Plan:  Increase Heparin  1050 units/hr  Claudine Cullens, PharmD, BCPS

## 2023-06-10 NOTE — Op Note (Signed)
    Patient name: Katie Hawkins MRN: [id number] DOB: 1951/05/26 Sex: female  06/10/2023 Pre-operative Diagnosis: Occluded left lower extremity bypass, currently undergoing lysis Post-operative diagnosis:  Same Surgeon:  Kayla Part, MD Procedure Performed: 1.  Lytic recheck, left lower extremity angiography, third order cannulation 2.  Drug-coated balloon angioplasty of the left femoral peroneal artery bypass 4 x 220, 4 x 150, 4 x 100 3.  Peroneal artery angioplasty 3 x 100, 2.5 x 4.  Device assisted closure-Mynx 5.  90 mL contrast   Indications: Patient is a 73 year old female with previous history of left sided femoral to peroneal artery bypass.  She has prior history of lower extremity interventions for primary assisted and secondary assisted patency.  She presented with the bypass occluded, and began lytic therapy yesterday.  She presents for lytic catheter recheck.  After discussing the risks and benefits, she elected to proceed.  Findings:  Underfilled left femoral to peroneal artery bypass.  Proximal peroneal artery dissection.  Single-vessel peroneal artery runoff to the foot.   Procedure:  The patient was identified in the holding area and taken to room 8.  The patient was then placed supine on the table and prepped and draped in the usual sterile fashion.  A time out was called.    The lytic catheter was removed using Seldinger technique, and a wire was left in the bypass.  Imaging demonstrated an underfilled left femoral to peroneal artery bypass with a proximal peroneal artery dissection.  There was single-vessel peroneal artery runoff to the foot.  I elected to attempt intervention on the left-sided bypass, beginning with balloon angioplasty.  The patient was heparinized, and a 4 mm by long balloon was brought into the field the entirety of the bypass was balloon angioplastied, and the angioplasty was continued into the peroneal artery.  A 3 mm balloon was used at the  distal aspect and proximal portion of the peroneal artery.  Follow-up angiography demonstrated significant improvement with brisk filling of the bypass, beading the profunda.  Distally, in the area not balloon plastied, there was a dissection appreciated in the peroneal artery.  This is likely the reason the bypass occluded.  I was able to use a series of wires and catheters to remain true lumen.  Angiography followed from the peroneal artery to prove that I was true lumen.  A 2.5 x 100 mm balloon was inflated along the dissection for 3 minutes.  Follow-up angiography demonstrated excellent result with tacking of the dissection flap.  There was excellent, brisk filling of the bypass and peroneal artery.  The bypass did not warrant stenting.  I elected to hopefully improve secondary patency by using drug-coated balloon angioplasty.  The entirety of the left femoral to peroneal artery bypass was angioplastied using 4 mm drug-coated balloons.  In total, 4 balloons were utilized.  Follow-up angiography demonstrated excellent result with resolution of the flow-limiting dissection and brisk single-vessel peroneal runoff to the foot.  The patient was closed with a ProGlide without issue.  Impression: Successful recanalization of the left femoral to peroneal artery bypass, using pharmacomechanical thrombolysis and drug-coated balloon angioplasty, with brisk filling to the foot.   Kayla Part MD [facility] [place] Office: [phone]

## 2023-06-10 NOTE — Discharge Instructions (Signed)
 Information on my medicine - ELIQUIS  (apixaban )  This medication education was reviewed with me or my healthcare representative as part of my discharge preparation.    Why was Eliquis  prescribed for you? Eliquis  was prescribed to treat blood clots that may have been found in the veins of your legs (deep vein thrombosis) or in your lungs (pulmonary embolism) and to reduce the risk of them occurring again.  What do You need to know about Eliquis  ? The starting dose is 10 mg (two 5 mg tablets) taken TWICE daily for the FIRST SEVEN (7) DAYS, then on 06/17/23 in the evening  the dose is reduced to ONE 5 mg tablet taken TWICE daily.  Eliquis  may be taken with or without food.   Try to take the dose about the same time in the morning and in the evening. If you have difficulty swallowing the tablet whole please discuss with your pharmacist how to take the medication safely.  Take Eliquis  exactly as prescribed and DO NOT stop taking Eliquis  without talking to the doctor who prescribed the medication.  Stopping may increase your risk of developing a new blood clot.  Refill your prescription before you run out.  After discharge, you should have regular check-up appointments with your healthcare provider that is prescribing your Eliquis .    What do you do if you miss a dose? If a dose of ELIQUIS  is not taken at the scheduled time, take it as soon as possible on the same day and twice-daily administration should be resumed. The dose should not be doubled to make up for a missed dose.  Important Safety Information A possible side effect of Eliquis  is bleeding. You should call your healthcare provider right away if you experience any of the following: Bleeding from an injury or your nose that does not stop. Unusual colored urine (red or dark brown) or unusual colored stools (red or black). Unusual bruising for unknown reasons. A serious fall or if you hit your head (even if there is no  bleeding).  Some medicines may interact with Eliquis  and might increase your risk of bleeding or clotting while on Eliquis . To help avoid this, consult your healthcare provider or pharmacist prior to using any new prescription or non-prescription medications, including herbals, vitamins, non-steroidal anti-inflammatory drugs (NSAIDs) and supplements.  This website has more information on Eliquis  (apixaban ): [email or web address] .[email or web address] Katie Hawkins

## 2023-06-10 NOTE — Progress Notes (Addendum)
  Progress Note    06/10/2023 8:22 AM 1 Day Post-Op  Subjective:  no rest pain BLE   Vitals:   06/10/23 0700 06/10/23 0800  BP: (!) 143/59 (!) 83/64  Pulse: 94 96  Resp: 13 (!) 21  Temp:    SpO2: 91% 94%   Physical Exam: Lungs:  non labored Incisions:  R groin without hematoma or bleeding Extremities:  exam limited by doppler but feet are warm with motor and sensation intact; L calf soft Neurologic: A&O  CBC    Component Value Date/Time   WBC 10.2 06/10/2023 0344   RBC 3.93 06/10/2023 0344   HGB 11.3 (L) 06/10/2023 0344   HGB 13.4 02/20/2014 1159   HCT 35.0 (L) 06/10/2023 0344   HCT 41.0 02/20/2014 1159   PLT 240 06/10/2023 0344   PLT 318 02/20/2014 1159   MCV 89.1 06/10/2023 0344   MCV 89.8 02/20/2014 1159   MCH 28.8 06/10/2023 0344   MCHC 32.3 06/10/2023 0344   RDW 14.4 06/10/2023 0344   RDW 13.6 02/20/2014 1159   LYMPHSABS 1.7 02/20/2014 1159   MONOABS 0.8 02/20/2014 1159   EOSABS 0.1 02/20/2014 1159   BASOSABS 0.1 02/20/2014 1159    BMET    Component Value Date/Time   NA 138 06/10/2023 0344   NA 140 02/20/2014 1159   K 3.8 06/10/2023 0344   K 3.7 02/20/2014 1159   CL 104 06/10/2023 0344   CL 103 08/11/2012 0838   CO2 23 06/10/2023 0344   CO2 27 02/20/2014 1159   GLUCOSE 185 (H) 06/10/2023 0344   GLUCOSE 176 (H) 02/20/2014 1159   GLUCOSE 165 (H) 08/11/2012 0838   BUN 12 06/10/2023 0344   BUN 16.6 02/20/2014 1159   CREATININE 0.62 06/10/2023 0344   CREATININE 0.7 02/20/2014 1159   CALCIUM  9.0 06/10/2023 0344   CALCIUM  10.0 02/20/2014 1159   GFRNONAA >60 06/10/2023 0344   GFRAA >60 11/22/2010 1515    INR    Component Value Date/Time   INR 1.0 10/23/2021 1005     Intake/Output Summary (Last 24 hours) at 06/10/2023 4098 Last data filed at 06/10/2023 0700 Gross per 24 hour  Intake 1870.21 ml  Output 1200 ml  Net 670.21 ml     Assessment/Plan:  73 y.o. female is s/p mechanical thrombectomy LLE bypass with initiation of thombolysis 1  Day Post-Op   Feet are warm with motor and sensation intact R groin is without bleeding or hematoma Plan is for lysis recheck with Dr. Rosalva Comber today; continue npo   Cordie Deters, PA-C [facility] [phone] 06/10/2023 8:22 AM  I agree with the above.  I have seen and evaluated the patient.  Plan for f/u lysis today  Wells Jakeia Carreras

## 2023-06-10 NOTE — Progress Notes (Signed)
 PHARMACY - ANTICOAGULATION CONSULT NOTE  Pharmacy Consult for apixaban  Indication:occluded LLE   Allergies  Allergen Reactions   Linagliptin     didn't like how she felt on this    Patient Measurements: Height: 5\' 4"  (162.6 cm) Weight: 76.7 kg (169 lb) IBW/kg (Calculated) : 54.7   Vital Signs: Temp: 98.4 F (36.9 C) (01/15 1530) Temp Source: Oral (01/15 1530) BP: 140/79 (01/15 1600) Pulse Rate: 111 (01/15 1600)  Labs: Recent Labs    06/09/23 0532 06/09/23 1341 06/09/23 1508 06/09/23 2207 06/10/23 0344  HGB 13.9   < > 11.8* 11.6* 11.3*  HCT 41.0   < > 36.2 35.3* 35.0*  PLT  --    < > 287 260 240  HEPARINUNFRC  --    < > 0.47 0.15* 0.16*  CREATININE 0.60  --   --   --  0.62   < > = values in this interval not displayed.    Estimated Creatinine Clearance: 62.8 mL/min (by C-G formula based on SCr of 0.62 mg/dL).   Medical History: Past Medical History:  Diagnosis Date   Diabetes mellitus    History of blood transfusion    HTN (hypertension), benign 02/13/2012   Hypercholesterolemia    Hyperlipidemia    Hypertension    nodular lymphocyte predominate hodgkins lymphoma dx'd 10/2010   xrt comp 02/20/11   Peripheral vascular disease [facility])      Assessment: 73 yo W with occluded LLE bypass s/p mechanical thrombectomy LLE bypass with initiation of thombolysis, balloon angioplasty. No anticoagulation prior to admission. Pharmacy consulted for heparin .    Plan to stop clopidogrel . Full treatment dose per MD.   Goal of Therapy:  Monitor platelets by anticoagulation protocol: Yes   Plan:  Apixaban  10mg  BID x7d then 5mg  BID Provided 30d free copay card   Dorene Gang, PharmD, BCPS, [facility] Clinical Pharmacist  Please check AMION for all [facility] Pharmacy phone numbers After 10:00 PM, call Main Pharmacy [phone]

## 2023-06-11 ENCOUNTER — Encounter (HOSPITAL_COMMUNITY): Payer: Self-pay | Admitting: Vascular Surgery

## 2023-06-12 LAB — SURGICAL PCR SCREEN
MRSA, PCR: NEGATIVE
Staphylococcus aureus: NEGATIVE

## 2023-06-25 NOTE — Discharge Summary (Signed)
Discharge Summary  Patient ID: Katie Hawkins [id number] 73 y.o. February 06, 1951  Admit date: 06/09/2023  Discharge date and time: 06/10/2023  8:35 PM   Admitting Physician: Nada Libman, MD   Discharge Physician: same  Admission Diagnoses: PAD (peripheral artery disease) [facility]) [I73.9]  Discharge Diagnoses: same  Admission Condition: fair  Discharged Condition: fair  Indication for Admission: thrombolysis of left leg bypass  Hospital Course: Ms. Katie Hawkins is a 73 year old female seen in the office and noted to have an occluded left lower extremity bypass by duplex.  She was brought to the Cath Lab the following day with Dr. Myra Gianotti on 06/09/2023 and underwent mechanical thrombectomy and laser atherectomy of the left femoral-tibial bypass with initiation of thrombolysis.  She tolerated the procedure well and was admitted to the ICU postoperatively for overnight lytic therapy.  The following day she was brought back to the Cath Lab for lysis recheck and also underwent drug-coated balloon angioplasty of the bypass as well as angioplasty of the peroneal artery by Dr. Karin Lieu.  She tolerated the procedure well.  Hemostasis was achieved from right groin catheterization site.  There was successful recanalization of the left femoral to peroneal bypass with brisk filling of the foot.  She will follow-up in office with imaging to see Dr. Myra Gianotti on 07/27/2023.  She was discharged home in stable condition.  Consults: None  Treatments: surgery: Mechanical thrombectomy and laser atherectomy of left femoral-tibial bypass with initiation of thrombolysis by Dr. Myra Gianotti on 06/09/2023  Lysis recheck with drug-coated balloon angioplasty of the bypass as well as angioplasty of the peroneal artery by Dr. Karin Lieu on 06/10/2023  Discharge Exam: hemostasis at R groin cath site Vitals:   06/10/23 1900 06/10/23 1921  BP: (!) 160/63   Pulse: (!) 101   Resp: 14   Temp:  98.1 F (36.7 C)  SpO2: 96%       Disposition: Discharge disposition: 01-Home or Self Care       Patient Instructions:  Allergies as of 06/10/2023       Reactions   Linagliptin    didn't like how she felt on this        Medication List     STOP taking these medications    clopidogrel 75 MG tablet Commonly known as: PLAVIX       TAKE these medications    Accu-Chek FastClix Lancets Misc   apixaban 5 MG Tabs tablet Commonly known as: ELIQUIS 1/15 - 1/22 morning: take 10mg  (2 tablets) by mouth twice daily. 1/22 evening and on: take 5mg  (1 tablet) by mouth twice daily   aspirin EC 81 MG tablet Take 1 tablet (81 mg total) by mouth daily. Swallow whole.   DRY EYE RELIEF DROPS OP Place 1 drop into both eyes daily as needed (dry eye).   ezetimibe 10 MG tablet Commonly known as: ZETIA Take 10 mg by mouth daily.   glimepiride 2 MG tablet Commonly known as: AMARYL Take 2 mg by mouth at bedtime.   glucose blood test strip 1 each by Other route as needed. Use as instructed. One Touch Delica Lancets used also.   lisinopril 10 MG tablet Commonly known as: ZESTRIL Take 10 mg by mouth daily.   metFORMIN 500 MG 24 hr tablet Commonly known as: GLUCOPHAGE-XR Take 500 mg by mouth 2 (two) times daily.   metoprolol succinate 25 MG 24 hr tablet Commonly known as: TOPROL-XL Take 25 mg by mouth daily.   MULTIVITAMIN GUMMIES ADULT PO Take 2 tablets  by mouth daily.   pravastatin 40 MG tablet Commonly known as: PRAVACHOL Take 40 mg by mouth Daily.       Activity: activity as tolerated Diet: regular diet Wound Care: none needed  Follow-up with Dr. Myra Gianotti on 07/27/23 with imaging.  SignedEmilie Rutter, PA-C 06/25/2023 8:52 AM [facility] Office: [phone]

## 2023-06-29 DIAGNOSIS — E11319 Type 2 diabetes mellitus with unspecified diabetic retinopathy without macular edema: Secondary | ICD-10-CM | POA: Diagnosis not present

## 2023-06-29 DIAGNOSIS — D649 Anemia, unspecified: Secondary | ICD-10-CM | POA: Diagnosis not present

## 2023-06-29 DIAGNOSIS — E7800 Pure hypercholesterolemia, unspecified: Secondary | ICD-10-CM | POA: Diagnosis not present

## 2023-06-29 DIAGNOSIS — I739 Peripheral vascular disease, unspecified: Secondary | ICD-10-CM | POA: Diagnosis not present

## 2023-06-29 DIAGNOSIS — E1165 Type 2 diabetes mellitus with hyperglycemia: Secondary | ICD-10-CM | POA: Diagnosis not present

## 2023-06-29 DIAGNOSIS — Z23 Encounter for immunization: Secondary | ICD-10-CM | POA: Diagnosis not present

## 2023-06-29 DIAGNOSIS — S98132A Complete traumatic amputation of one left lesser toe, initial encounter: Secondary | ICD-10-CM | POA: Diagnosis not present

## 2023-06-29 DIAGNOSIS — I10 Essential (primary) hypertension: Secondary | ICD-10-CM | POA: Diagnosis not present

## 2023-07-17 ENCOUNTER — Other Ambulatory Visit: Payer: Self-pay

## 2023-07-17 DIAGNOSIS — I70262 Atherosclerosis of native arteries of extremities with gangrene, left leg: Secondary | ICD-10-CM

## 2023-07-27 ENCOUNTER — Ambulatory Visit (INDEPENDENT_AMBULATORY_CARE_PROVIDER_SITE_OTHER)
Admission: RE | Admit: 2023-07-27 | Discharge: 2023-07-27 | Disposition: A | Discharge status: Home / Self Care | Payer: Medicare HMO | Source: Ambulatory Visit | Attending: Surgery | Admitting: Surgery

## 2023-07-27 ENCOUNTER — Encounter: Payer: Self-pay | Admitting: Surgery

## 2023-07-27 ENCOUNTER — Ambulatory Visit (HOSPITAL_COMMUNITY)
Admission: RE | Admit: 2023-07-27 | Discharge: 2023-07-27 | Disposition: A | Discharge status: Home / Self Care | Payer: Medicare HMO | Source: Ambulatory Visit | Attending: Surgery | Admitting: Surgery

## 2023-07-27 ENCOUNTER — Ambulatory Visit (INDEPENDENT_AMBULATORY_CARE_PROVIDER_SITE_OTHER): Payer: 59 | Admitting: Surgery

## 2023-07-27 VITALS — BP 127/84 | HR 107 | Temp 98.1°F | Ht 63.0 in | Wt 165.4 lb

## 2023-07-27 DIAGNOSIS — I70262 Atherosclerosis of native arteries of extremities with gangrene, left leg: Secondary | ICD-10-CM | POA: Insufficient documentation

## 2023-07-27 LAB — VAS US ABI WITH/WO TBI
Left ABI: 1.01
Right ABI: 0.66

## 2023-07-27 NOTE — Progress Notes (Signed)
 [facility] [place]  Patient name: Katie Hawkins MRN: [id number] DOB: 08/15/1950 Sex: female   REASON FOR VISIT:    Follow up  HISOTRY OF PRESENT ILLNESS:   Zariel Capano is a 73 y.o. female who is status post left femoral to peroneal artery bypass graft with saphenous vein on 10/30/2021.  This was done for left toe gangrene.  She underwent amputation by podiatry on 11/02/2021.  Surveillance imaging revealed a stenosis within her bypass graft.  She went for angiography on 09/30/2022.  She was found to have tandem left femoral-popliteal bypass graft lesions, 50 and 90%.  Both were treated with a 4 mm drug-coated balloon.  I saw her in January 2025 and there was concern about bypass graft occlusion.  She was scheduled for angiography on 06/09/2023.  She was found to have occlusion of the bypass graft.  A lysis catheter was placed after laser atherectomy and mechanical thrombectomy.  She was brought back the following day.  She had cleaned up nicely.  She did have angioplasty of the peroneal vein.  She has no complaints today   She is walking at least half a mile every day.  She remains on aspirin and statin.  She has stopped taking her Plavix    PAST MEDICAL HISTORY:   Past Medical History:  Diagnosis Date   Diabetes mellitus    History of blood transfusion    HTN (hypertension), benign 02/13/2012   Hypercholesterolemia    Hyperlipidemia    Hypertension    nodular lymphocyte predominate hodgkins lymphoma dx'd 10/2010   xrt comp 02/20/11   Peripheral vascular disease (HCC)      FAMILY HISTORY:   Family History  Problem Relation Age of Onset   Diabetes Father    Cancer Maternal Grandfather        lung   Cancer Paternal Grandfather        leukemia    SOCIAL HISTORY:   Social History   Tobacco Use   Smoking status: Never   Smokeless tobacco: Never  Substance Use Topics   Alcohol use: Yes    Alcohol/week: 0.0 standard  drinks of alcohol    Comment: occasionally     ALLERGIES:   Allergies  Allergen Reactions   Linagliptin     didn't like how she felt on this     CURRENT MEDICATIONS:   Current Outpatient Medications  Medication Sig Dispense Refill   Accu-Chek FastClix Lancets MISC      aspirin EC 81 MG tablet Take 1 tablet (81 mg total) by mouth daily. Swallow whole. 30 tablet 12   clopidogrel (PLAVIX) 75 MG tablet Take 75 mg by mouth daily.     ezetimibe (ZETIA) 10 MG tablet Take 10 mg by mouth daily.     glimepiride (AMARYL) 2 MG tablet Take 2 mg by mouth at bedtime.     glucose blood test strip 1 each by Other route as needed. Use as instructed. One Touch Delica Lancets used also.      Glycerin-Hypromellose-PEG 400 (DRY EYE RELIEF DROPS OP) Place 1 drop into both eyes daily as needed (dry eye).     lisinopril (PRINIVIL,ZESTRIL) 10 MG tablet Take 10 mg by mouth daily.       metFORMIN (GLUCOPHAGE-XR) 500 MG 24 hr tablet Take 500 mg by mouth 2 (two) times daily.     metoprolol succinate (TOPROL-XL) 25 MG 24 hr tablet Take 25 mg by mouth daily.  0   Multiple Vitamins-Minerals (MULTIVITAMIN  GUMMIES ADULT PO) Take 2 tablets by mouth daily.     pravastatin (PRAVACHOL) 40 MG tablet Take 40 mg by mouth Daily.      apixaban (ELIQUIS) 5 MG TABS tablet 1/15 - 1/22 morning: take 10mg  (2 tablets) by mouth twice daily. 1/22 evening and on: take 5mg  (1 tablet) by mouth twice daily (Patient not taking: Reported on 07/27/2023) 73 tablet 0   No current facility-administered medications for this visit.    REVIEW OF SYSTEMS:   [X]  denotes positive finding, [ ]  denotes negative finding Cardiac  Comments:  Chest pain or chest pressure:    Shortness of breath upon exertion:    Short of breath when lying flat:    Irregular heart rhythm:        Vascular    Pain in calf, thigh, or hip brought on by ambulation:    Pain in feet at night that wakes you up from your sleep:     Blood clot in your veins:    Leg  swelling:         Pulmonary    Oxygen at home:    Productive cough:     Wheezing:         Neurologic    Sudden weakness in arms or legs:     Sudden numbness in arms or legs:     Sudden onset of difficulty speaking or slurred speech:    Temporary loss of vision in one eye:     Problems with dizziness:         Gastrointestinal    Blood in stool:     Vomited blood:         Genitourinary    Burning when urinating:     Blood in urine:        Psychiatric    Major depression:         Hematologic    Bleeding problems:    Problems with blood clotting too easily:        Skin    Rashes or ulcers:        Constitutional    Fever or chills:      PHYSICAL EXAM:   Vitals:   07/27/23 1355  BP: 127/84  Pulse: (!) 107  Temp: 98.1 F (36.7 C)  TempSrc: Temporal  SpO2: 96%  Weight: 165 lb 6.4 oz (75 kg)  Height: 5\' 3"  (1.6 m)    GENERAL: The patient is a well-nourished female, in no acute distress. The vital signs are documented above. CARDIAC: There is a regular rate and rhythm. PULMONARY: Non-labored respirations MUSCULOSKELETAL: There are no major deformities or cyanosis. NEUROLOGIC: No focal weakness or paresthesias are detected. SKIN: There are no ulcers or rashes noted. PSYCHIATRIC: The patient has a normal affect.  STUDIES:   I have reviewed the following: ABI/TBIToday's ABIToday's TBIPrevious ABIPrevious TBI  +-------+-----------+-----------+------------+------------+  Right 0.66       0.30       0.76        0.25          +-------+-----------+-----------+------------+------------+  Left  1.01       0.59       0.33        absent        +-------+-----------+-----------+------------+------------+  Right toe pressure: 43 Left toe pressure: 84 Waveforms are all monophasic  Left: Patent femoral to peroneal bypass graft without evidence of  stenosis.    MEDICAL ISSUES:   PAD: By ultrasound, the patient's bypass graft  is widely patent today.   We talked about treating her with aspirin and Plavix and discontinue her Eliquis as her insurance company would not cover the Eliquis and it was too expensive.  I think dual antiplatelet therapy in addition to her other medications is appropriate.  She will return in 3 months for surveillance imaging.  I would have a low threshold to repeat her angiogram, given her history.    Charlena Cross, MD, FACS [facility] [phone] Pager [phone]

## 2023-08-04 DIAGNOSIS — E1165 Type 2 diabetes mellitus with hyperglycemia: Secondary | ICD-10-CM | POA: Diagnosis not present

## 2023-08-04 DIAGNOSIS — I10 Essential (primary) hypertension: Secondary | ICD-10-CM | POA: Diagnosis not present

## 2023-08-04 DIAGNOSIS — R5381 Other malaise: Secondary | ICD-10-CM | POA: Diagnosis not present

## 2023-08-04 DIAGNOSIS — R399 Unspecified symptoms and signs involving the genitourinary system: Secondary | ICD-10-CM | POA: Diagnosis not present

## 2023-08-06 ENCOUNTER — Other Ambulatory Visit: Payer: Self-pay | Admitting: *Deleted

## 2023-08-06 DIAGNOSIS — I70262 Atherosclerosis of native arteries of extremities with gangrene, left leg: Secondary | ICD-10-CM

## 2023-08-06 DIAGNOSIS — I739 Peripheral vascular disease, unspecified: Secondary | ICD-10-CM

## 2023-08-17 DIAGNOSIS — D72820 Lymphocytosis (symptomatic): Secondary | ICD-10-CM | POA: Diagnosis not present

## 2023-08-17 DIAGNOSIS — R5381 Other malaise: Secondary | ICD-10-CM | POA: Diagnosis not present

## 2023-09-24 ENCOUNTER — Other Ambulatory Visit (HOSPITAL_BASED_OUTPATIENT_CLINIC_OR_DEPARTMENT_OTHER): Payer: Self-pay

## 2023-09-24 ENCOUNTER — Emergency Department (HOSPITAL_BASED_OUTPATIENT_CLINIC_OR_DEPARTMENT_OTHER)
Admission: EM | Admit: 2023-09-24 | Discharge: 2023-09-24 | Disposition: A | Discharge status: Home / Self Care | Attending: Emergency Medicine | Admitting: Emergency Medicine

## 2023-09-24 ENCOUNTER — Emergency Department (HOSPITAL_BASED_OUTPATIENT_CLINIC_OR_DEPARTMENT_OTHER)

## 2023-09-24 ENCOUNTER — Other Ambulatory Visit: Payer: Self-pay

## 2023-09-24 ENCOUNTER — Encounter (HOSPITAL_BASED_OUTPATIENT_CLINIC_OR_DEPARTMENT_OTHER): Payer: Self-pay | Admitting: Emergency Medicine

## 2023-09-24 DIAGNOSIS — W01190A Fall on same level from slipping, tripping and stumbling with subsequent striking against furniture, initial encounter: Secondary | ICD-10-CM | POA: Insufficient documentation

## 2023-09-24 DIAGNOSIS — S0990XA Unspecified injury of head, initial encounter: Secondary | ICD-10-CM | POA: Diagnosis present

## 2023-09-24 DIAGNOSIS — Z7984 Long term (current) use of oral hypoglycemic drugs: Secondary | ICD-10-CM | POA: Insufficient documentation

## 2023-09-24 DIAGNOSIS — Z7901 Long term (current) use of anticoagulants: Secondary | ICD-10-CM | POA: Insufficient documentation

## 2023-09-24 DIAGNOSIS — S0003XA Contusion of scalp, initial encounter: Secondary | ICD-10-CM

## 2023-09-24 DIAGNOSIS — Z7982 Long term (current) use of aspirin: Secondary | ICD-10-CM | POA: Diagnosis not present

## 2023-09-24 DIAGNOSIS — Z043 Encounter for examination and observation following other accident: Secondary | ICD-10-CM | POA: Diagnosis not present

## 2023-09-24 MED ORDER — ACETAMINOPHEN 325 MG PO TABS
650.0000 mg | ORAL_TABLET | Freq: Once | ORAL | Status: AC
  Administered 2023-09-24: 650 mg via ORAL
  Filled 2023-09-24: qty 2

## 2023-09-24 NOTE — ED Triage Notes (Signed)
 Mechanical fall yesterday afternoon. Hit head. Denies LOC. Endorses increased nausea and dizziness since fall. No unilateral deficits.   Takes Plavix ,

## 2023-09-24 NOTE — ED Triage Notes (Signed)
 Mechanical fall yesterday afternoon. Hit head. Denies LOC. Endorses increased nausea and dizziness since fall. No unilateral deficits.

## 2023-09-24 NOTE — ED Notes (Signed)
 Discharge paperwork given and verbally understood.

## 2023-09-24 NOTE — ED Provider Notes (Signed)
 [place] EMERGENCY DEPARTMENT AT [facility] Provider Note   CSN: [id number] Arrival date & time: 09/24/23  7829     History  Chief Complaint  Patient presents with   Katie Hawkins is a 73 y.o. female.  Patient here after fall last night.  Hit her head.  She tripped walking into the door.  She is on Plavix .  She denies any weakness numbness tingling.  Still with a mild headache.  Did not lose consciousness.  Has been ambulatory since the fall yesterday afternoon at 3 PM.  She called her doctor today who told her to come for evaluation.  She denies any extremity pain.  No chest pain or shortness of breath  The history is provided by the patient.       Home Medications Prior to Admission medications   Medication Sig Start Date End Date Taking? Authorizing Provider  Accu-Chek FastClix Lancets MISC  07/17/21   [provider]  apixaban  (ELIQUIS ) 5 MG TABS tablet 1/15 - 1/22 morning: take 10mg  (2 tablets) by mouth twice daily. 1/22 evening and on: take 5mg  (1 tablet) by mouth twice daily Patient not taking: Reported on 07/27/2023 06/10/23   Margherita Shell, MD  aspirin  EC 81 MG tablet Take 1 tablet (81 mg total) by mouth daily. Swallow whole. 06/10/23   Margherita Shell, MD  clopidogrel  (PLAVIX ) 75 MG tablet Take 75 mg by mouth daily.    [provider]  ezetimibe  (ZETIA ) 10 MG tablet Take 10 mg by mouth daily. 09/06/21   [provider]  glimepiride (AMARYL) 2 MG tablet Take 2 mg by mouth at bedtime. 09/12/21   [provider]  glucose blood test strip 1 each by Other route as needed. Use as instructed. One Touch Delica Lancets used also.     [provider]  Glycerin-Hypromellose-PEG 400 (DRY EYE RELIEF DROPS OP) Place 1 drop into both eyes daily as needed (dry eye).    [provider]  lisinopril  (PRINIVIL ,ZESTRIL ) 10 MG tablet Take 10 mg by mouth daily.      [provider]  metFORMIN  (GLUCOPHAGE -XR) 500  MG 24 hr tablet Take 500 mg by mouth 2 (two) times daily. 07/17/21   [provider]  metoprolol  succinate (TOPROL -XL) 25 MG 24 hr tablet Take 25 mg by mouth daily. 01/10/15   [provider]  Multiple Vitamins-Minerals (MULTIVITAMIN GUMMIES ADULT PO) Take 2 tablets by mouth daily.    [provider]  pravastatin  (PRAVACHOL ) 40 MG tablet Take 40 mg by mouth Daily.  03/24/11   [provider]      Allergies    Linagliptin    Review of Systems   Review of Systems  Physical Exam Updated Vital Signs BP (!) 152/95   Pulse (!) 115   SpO2 99%  Physical Exam Vitals and nursing note reviewed.  Constitutional:      General: She is not in acute distress.    Appearance: She is well-developed. She is not ill-appearing.  HENT:     Head:     Comments: Hematoma to the back of the head    Nose: Nose normal.     Mouth/Throat:     Mouth: Mucous membranes are moist.  Eyes:     Extraocular Movements: Extraocular movements intact.     Conjunctiva/sclera: Conjunctivae normal.     Pupils: Pupils are equal, round, and reactive to light.  Cardiovascular:     Rate and Rhythm: Normal  rate and regular rhythm.     Pulses: Normal pulses.     Heart sounds: Normal heart sounds. No murmur heard. Pulmonary:     Effort: Pulmonary effort is normal. No respiratory distress.     Breath sounds: Normal breath sounds.  Abdominal:     Palpations: Abdomen is soft.     Tenderness: There is no abdominal tenderness.  Musculoskeletal:        General: No swelling.     Cervical back: Normal range of motion and neck supple.  Skin:    General: Skin is warm and dry.     Capillary Refill: Capillary refill takes less than 2 seconds.  Neurological:     General: No focal deficit present.     Mental Status: She is alert and oriented to person, place, and time.     Cranial Nerves: No cranial nerve deficit.     Sensory: No sensory deficit.     Motor: No weakness.     Coordination:  Coordination normal.     Comments: 5+/5 strength, normal sensation, normal speech  Psychiatric:        Mood and Affect: Mood normal.     ED Results / Procedures / Treatments   Labs (all labs ordered are listed, but only abnormal results are displayed) Labs Reviewed - No data to display  EKG None  Radiology CT Head Wo Contrast Result Date: 09/24/2023 CLINICAL DATA:  Fall EXAM: CT HEAD WITHOUT CONTRAST CT CERVICAL SPINE WITHOUT CONTRAST TECHNIQUE: Multidetector CT imaging of the head and cervical spine was performed following the standard protocol without intravenous contrast. Multiplanar CT image reconstructions of the cervical spine were also generated. RADIATION DOSE REDUCTION: This exam was performed according to the departmental dose-optimization program which includes automated exposure control, adjustment of the mA and/or kV according to patient size and/or use of iterative reconstruction technique. COMPARISON:  None Available. FINDINGS: CT HEAD FINDINGS Brain: No evidence of acute infarction, hemorrhage, hydrocephalus, extra-axial collection or mass lesion/mass effect. Vascular: No hyperdense vessel. Skull: High left posterior scalp contusion without acute fracture. Sinuses/Orbits: Clear sinuses.  No acute orbital findings. CT CERVICAL SPINE FINDINGS Alignment: Slight anterolisthesis of C3 on C4 and C4 on C5 is likely degenerative in etiology. Otherwise, no substantial sagittal subluxation. Skull base and vertebrae: Vertebral body heights are maintained. No evidence of acute fracture. Soft tissues and spinal canal: No prevertebral fluid or swelling. No visible canal hematoma. Disc levels: Mild-to-moderate for age multilevel degenerative change. Upper chest: Visualized lung apices are clear. IMPRESSION: 1. No evidence of acute abnormality intracranially or in the cervical spine. 2. High left posterior scalp contusion. Electronically Signed   By: Stevenson Elbe M.D.   On: 09/24/2023 11:19    CT Cervical Spine Wo Contrast Result Date: 09/24/2023 CLINICAL DATA:  Fall EXAM: CT HEAD WITHOUT CONTRAST CT CERVICAL SPINE WITHOUT CONTRAST TECHNIQUE: Multidetector CT imaging of the head and cervical spine was performed following the standard protocol without intravenous contrast. Multiplanar CT image reconstructions of the cervical spine were also generated. RADIATION DOSE REDUCTION: This exam was performed according to the departmental dose-optimization program which includes automated exposure control, adjustment of the mA and/or kV according to patient size and/or use of iterative reconstruction technique. COMPARISON:  None Available. FINDINGS: CT HEAD FINDINGS Brain: No evidence of acute infarction, hemorrhage, hydrocephalus, extra-axial collection or mass lesion/mass effect. Vascular: No hyperdense vessel. Skull: High left posterior scalp contusion without acute fracture. Sinuses/Orbits: Clear sinuses.  No acute orbital findings. CT CERVICAL SPINE FINDINGS  Alignment: Slight anterolisthesis of C3 on C4 and C4 on C5 is likely degenerative in etiology. Otherwise, no substantial sagittal subluxation. Skull base and vertebrae: Vertebral body heights are maintained. No evidence of acute fracture. Soft tissues and spinal canal: No prevertebral fluid or swelling. No visible canal hematoma. Disc levels: Mild-to-moderate for age multilevel degenerative change. Upper chest: Visualized lung apices are clear. IMPRESSION: 1. No evidence of acute abnormality intracranially or in the cervical spine. 2. High left posterior scalp contusion. Electronically Signed   By: Stevenson Elbe M.D.   On: 09/24/2023 11:19    Procedures Procedures    Medications Ordered in ED Medications  acetaminophen  (TYLENOL ) tablet 650 mg (650 mg Oral Given 09/24/23 1042)    ED Course/ Medical Decision Making/ A&P                                 Medical Decision Making Amount and/or Complexity of Data Reviewed Radiology:  ordered.  Risk OTC drugs.   Shaun Deiters is here after fall yesterday.  Sounds like she lost her balance tripped walking to the house yesterday hit the back of her head.  She is on Plavix .  This occurred almost 24 hours ago.  Will get a CT scan of her head and neck.  She has a hematoma to the back of her head.  Unremarkable vitals.  Neurologically she is intact.  Not having pain elsewhere.  Does not sound like she had a syncopal event or LOC.  Will do traumatic workup anticipate discharge to home if imaging is unremarkable.  Could have a mild concussion.  Educated about this.  CT report unremarkable.  She has a scalp contusion but no other acute process.  Continue Tylenol  ice and rest.  Educated about concussions.  Discharged in good condition.  Understands return precautions.  Follow-up with primary care.  This chart was dictated using voice recognition software.  Despite best efforts to proofread,  errors can occur which can change the documentation meaning.         Final Clinical Impression(s) / ED Diagnoses Final diagnoses:  Contusion of scalp, initial encounter    Rx / DC Orders ED Discharge Orders     None         Lowery Rue, DO 09/24/23 1138

## 2023-10-26 ENCOUNTER — Ambulatory Visit (HOSPITAL_COMMUNITY)
Admission: RE | Admit: 2023-10-26 | Discharge: 2023-10-26 | Disposition: A | Discharge status: Home / Self Care | Source: Ambulatory Visit | Attending: Surgery | Admitting: Surgery

## 2023-10-26 ENCOUNTER — Ambulatory Visit: Admitting: Physician Assistant

## 2023-10-26 VITALS — BP 159/67 | HR 113 | Temp 99.1°F | Ht 63.0 in | Wt 152.7 lb

## 2023-10-26 DIAGNOSIS — T82898A Other specified complication of vascular prosthetic devices, implants and grafts, initial encounter: Secondary | ICD-10-CM

## 2023-10-26 DIAGNOSIS — I70262 Atherosclerosis of native arteries of extremities with gangrene, left leg: Secondary | ICD-10-CM

## 2023-10-26 DIAGNOSIS — I739 Peripheral vascular disease, unspecified: Secondary | ICD-10-CM

## 2023-10-26 LAB — VAS US ABI WITH/WO TBI
Left ABI: 0
Right ABI: 0.72

## 2023-10-26 NOTE — Progress Notes (Signed)
 Office Note     CC:  follow up Requesting Provider:  Faustina Hood, MD  HPI: Katie Hawkins is a 73 y.o. (December 03, 1950) female who presents for surveillance follow up of PAD. She has history of left femoral to peroneal artery bypass graft with saphenous vein on 10/30/21 by Dr. Charlotte Cookey. This was for left toe gangrene. She subsequently underwent amputation of the toe by podiatry on 11/02/21. This healed well following. Since her bypass she has required Angiogram for recurrent stenosis in the bypass. Most recently in January of 2025 she was found to have an occluded bypass graft. She underwent lysis with laser atherectomy and mechanical thrombectomy with successful re cannulation of her bypass graft. At her last visit in March with Dr. Charlotte Cookey she was without any claudication, rest pain or tissue loss and her bypass was was widely patent on duplex.   Today she reports overall she is doing well. She has noticed some coolness in her left foot only at night time. She denies any pain at rest or on ambulation. No tissue loss. She says about 5 weeks ago she took a hard fall and had hit the back of her head and her right side so she has been recovering from that. Prior to that she was walking a lot more and using her stationary pedal foot bike for up to 50 minutes a day. She says she is just getting back into it but has only been doing 10 minutes.  She is medically managed on Aspirin , Plavix  and statin. She says she has been taking these religiously.   The pt is a statin for cholesterol management.    The pt is an aspirin .    Other AC:  Plavix  The pt is on ACE, BB for hypertension.  The pt does have diabetes. Tobacco hx:  never  Past Medical History:  Diagnosis Date   Diabetes mellitus    History of blood transfusion    HTN (hypertension), benign 02/13/2012   Hypercholesterolemia    Hyperlipidemia    Hypertension    nodular lymphocyte predominate hodgkins lymphoma dx'd 10/2010   xrt comp 02/20/11    Peripheral vascular disease [facility])     Past Surgical History:  Procedure Laterality Date   ABDOMINAL AORTOGRAM W/LOWER EXTREMITY N/A 10/18/2021   Procedure: ABDOMINAL AORTOGRAM W/LOWER EXTREMITY;  Surgeon: Carlene Che, MD;  Location: [facility] INVASIVE CV LAB;  Service: Cardiovascular;  Laterality: N/A;   ABDOMINAL AORTOGRAM W/LOWER EXTREMITY N/A 09/30/2022   Procedure: ABDOMINAL AORTOGRAM W/LOWER EXTREMITY;  Surgeon: Margherita Shell, MD;  Location: [facility] INVASIVE CV LAB;  Service: Cardiovascular;  Laterality: N/A;   ABDOMINAL AORTOGRAM W/LOWER EXTREMITY N/A 06/09/2023   Procedure: ABDOMINAL AORTOGRAM W/LOWER EXTREMITY;  Surgeon: Margherita Shell, MD;  Location: [facility] INVASIVE CV LAB;  Service: Cardiovascular;  Laterality: N/A;   AMPUTATION TOE Left 11/01/2021   Procedure: AMPUTATION TOE;  Surgeon: Floyce Hutching, DPM;  Location: [facility] OR;  Service: Podiatry;  Laterality: Left;  will do own block   APPLICATION OF WOUND VAC Left 10/30/2021   Procedure: APPLICATION OF WOUND VAC;  Surgeon: Margherita Shell, MD;  Location: [facility] OR;  Service: Vascular;  Laterality: Left;   BYPASS GRAFT POPLITEAL TO TIBIAL Left 10/30/2021   Procedure: LEFT FEMORAL TO PERONEAL BYPASS GRAFT USING VEIN;  Surgeon: Margherita Shell, MD;  Location: [facility] OR;  Service: Vascular;  Laterality: Left;   CESAREAN SECTION     2   DILATION AND CURETTAGE OF UTERUS  PERIPHERAL VASCULAR ATHERECTOMY  06/09/2023   Procedure: PERIPHERAL VASCULAR ATHERECTOMY;  Surgeon: Margherita Shell, MD;  Location: [facility] INVAS[facility] CV [facility];  Service: Cardiovascular;;   PERIPHERAL VASCULAR BALLOON ANGIOPLASTY  09/30/2022   Procedure: PERIPHERAL VASCULAR BALLOON ANGIOPLASTY;  Surgeon: Margherita Shell, MD;  Location: [facility];  Service: Cardiovascular;;   PERIPHERAL VASCULAR BALLOON ANGIOPLASTY Left 06/10/2023   Procedure: PERIPHERAL VASCULAR BALLOON ANGIOPLASTY;  Surgeon: Kayla Part, MD;  Location[facility] INVASIVE CV LAB;  Service: Cardiovascular;  Laterality: Left;    PERIPHERAL VASCULAR THROMBECTOMY  06/09/2023   Procedure: PERIPHERAL VASCULAR THROMBECTOMY;  Surgeon: Margherita Shell, MD;  Location: [facility]AS[facility] CV [facility];  Service: Cardiovascular;;   PERIPHERAL VASCULAR THROMBECTOMY Left 06/10/2023   Procedure: LYSIS RECHECK;  Surgeon: Kayla Part, MD;  Location: [facility] INVASIVE CV LAB;  Service: Cardiovascular;  Laterality: Left;   VEIN HARVEST Left 10/30/2021   Procedure: VEIN HARVEST;  Surgeon: Margherita Shell, MD;  Location: [facility] OR;  Service: Vascular;  Laterality: Left;    Social History   Socioeconomic History   Marital status: Married    Spouse name: Not on file   Number of children: Not on file   Years of education: Not on file   Highest education level: Not on file  Occupational History   Not on file  Tobacco Use   Smoking status: Never   Smokeless tobacco: Never  Vaping Use   Vaping status: Never Used  Substance and Sexual Activity   Alcohol use: Yes    Alcohol/week: 0.0 standard drinks of alcohol    Comment: occasionally   Drug use: No   Sexual activity: Not on file  Other Topics Concern   Not on file  Social History Narrative   Not on file   Social Drivers of Health   Financial Resource Strain: Not on file  Food Insecurity: No Food Insecurity (06/09/2023)   Hunger Vital Sign    Worried About Running Out of Food in the Last Year: Never true    Ran Out of Food in the Last Year: Never true  Transportation Needs: No Transportation Needs (06/09/2023)   PRAPARE - Administrator, Civil Service (Medical): No    Lack of Transportation (Non-Medical): No  Physical Activity: Not on file  Stress: Not on file  Social Connections: Socially Integrated (06/09/2023)   Social Connection and Isolation Panel [NHANES]    Frequency of Communication with Friends and Family: Three times a week    Frequency of Social Gatherings with Friends and Family: More than three times a week    Attends Religious Services: More than 4 times per year     Active Member of [employer] or Organizations: Yes    Attends Engineer, structural: More than 4 times per year    Marital Status: Married  Catering manager Violence: Not At Risk (06/09/2023)   Humiliation, Afraid, Rape, and Kick questionnaire    Fear of Current or Ex-Partner: No    Emotionally Abused: No    Physically Abused: No    Sexually Abused: No    Family History  Problem Relation Age of Onset   Diabetes Father    Cancer Maternal Grandfather        lung   Cancer Paternal Grandfather        leukemia    Current Outpatient Medications  Medication Sig Dispense Refill   Accu-Chek FastClix Lancets MISC      aspirin  EC 81 MG tablet Take 1  tablet (81 mg total) by mouth daily. Swallow whole. 30 tablet 12   clopidogrel  (PLAVIX ) 75 MG tablet Take 75 mg by mouth daily.     ezetimibe  (ZETIA ) 10 MG tablet Take 10 mg by mouth daily.     glimepiride (AMARYL) 2 MG tablet Take 2 mg by mouth at bedtime.     glucose blood test strip 1 each by Other route as needed. Use as instructed. One Touch Delica Lancets used also.      Glycerin-Hypromellose-PEG 400 (DRY EYE RELIEF DROPS OP) Place 1 drop into both eyes daily as needed (dry eye).     lisinopril  (PRINIVIL ,ZESTRIL ) 10 MG tablet Take 10 mg by mouth daily.       metFORMIN  (GLUCOPHAGE -XR) 500 MG 24 hr tablet Take 500 mg by mouth 2 (two) times daily.     metoprolol  succinate (TOPROL -XL) 25 MG 24 hr tablet Take 25 mg by mouth daily.  0   Multiple Vitamins-Minerals (MULTIVITAMIN GUMMIES ADULT PO) Take 2 tablets by mouth daily.     pravastatin  (PRAVACHOL ) 40 MG tablet Take 40 mg by mouth Daily.      apixaban  (ELIQUIS ) 5 MG TABS tablet 1/15 - 1/22 morning: take 10mg  (2 tablets) by mouth twice daily. 1/22 evening and on: take 5mg  (1 tablet) by mouth twice daily (Patient not taking: Reported on 10/26/2023) 73 tablet 0   No current facility-administered medications for this visit.    Allergies  Allergen Reactions   Linagliptin     didn't like  how she felt on this     REVIEW OF SYSTEMS:   [X]  denotes positive finding, [ ]  denotes negative finding Cardiac  Comments:  Chest pain or chest pressure:    Shortness of breath upon exertion:    Short of breath when lying flat:    Irregular heart rhythm:        Vascular    Pain in calf, thigh, or hip brought on by ambulation:    Pain in feet at night that wakes you up from your sleep:     Blood clot in your veins:    Leg swelling:         Pulmonary    Oxygen at home:    Productive cough:     Wheezing:         Neurologic    Sudden weakness in arms or legs:     Sudden numbness in arms or legs:     Sudden onset of difficulty speaking or slurred speech:    Temporary loss of vision in one eye:     Problems with dizziness:         Gastrointestinal    Blood in stool:     Vomited blood:         Genitourinary    Burning when urinating:     Blood in urine:        Psychiatric    Major depression:         Hematologic    Bleeding problems:    Problems with blood clotting too easily:        Skin    Rashes or ulcers:        Constitutional    Fever or chills:      PHYSICAL EXAMINATION:  Vitals:   10/26/23 0938  BP: (!) 159/67  Pulse: (!) 113  Temp: 99.1 F (37.3 C)  TempSrc: Temporal  SpO2: 95%  Weight: 152 lb 11.2 oz (69.3 kg)  Height: 5\' 3"  (1.6 m)  General:  WDWN in NAD; vital signs documented above Gait: normal HENT: WNL, normocephalic Pulmonary: normal non-labored breathing Cardiac: regular HR Abdomen: soft, NT, no masses Vascular Exam/Pulses: 2+ femoral pulses, no palpable distal pulses in BLE. Feet are warm Extremities: without ischemic changes, without Gangrene , without cellulitis; without open wounds;  Musculoskeletal: no muscle wasting or atrophy  Neurologic: A&O X 3 Psychiatric:  The pt has Normal affect.   Non-Invasive Vascular Imaging:   +-------+-----------+-----------+------------+------------+  ABI/TBIToday's ABIToday's  TBIPrevious ABIPrevious TBI  +-------+-----------+-----------+------------+------------+  Right 0.72       0.46       0.66        0.3           +-------+-----------+-----------+------------+------------+  Left  0.         0          01.1        0.59          +-------+-----------+-----------+------------+------------+  Previous ABI on 07/27/23.   VAS US  Lower extremity arterial duplex: +-----------+--------+-----+--------+----------+--------+  LEFT      PSV cm/sRatioStenosisWaveform  Comments  +-----------+--------+-----+--------+----------+--------+  CFA Distal 132                  triphasic           +-----------+--------+-----+--------+----------+--------+  DFA       125                  monophasic          +-----------+--------+-----+--------+----------+--------+  SFA Prox   117                  biphasic            +-----------+--------+-----+--------+----------+--------+  SFA Mid    50                   monophasic          +-----------+--------+-----+--------+----------+--------+  POP Prox                occluded                    +-----------+--------+-----+--------+----------+--------+  PERO Distal             occluded                    +-----------+--------+-----+--------+----------+--------+   Left Graft #1: CFA to peroneal with vein  +--------------------+--------+--------+----------+--------+                     PSV cm/sStenosisWaveform  Comments  +--------------------+--------+--------+----------+--------+  Inflow             132             triphasic           +--------------------+--------+--------+----------+--------+  Proximal Anastomosis82              monophasic          +--------------------+--------+--------+----------+--------+  Proximal Graft              occluded                    +--------------------+--------+--------+----------+--------+  Mid Graft                    occluded                    +--------------------+--------+--------+----------+--------+  Distal Graft  occluded                    +--------------------+--------+--------+----------+--------+  Distal Anastomosis          occluded                    +--------------------+--------+--------+----------+--------+  Outflow                                                +--------------------+--------+--------+----------+--------+   Summary:  Left: CFA to peroneal bypass graft is occluded. The native SFA appears patent to the mid/distal thigh.    ASSESSMENT/PLAN:: 73 y.o. female here for surveillance follow up of PAD. She has history of left femoral to peroneal artery bypass graft with saphenous vein on 10/30/21 by Dr. Charlotte Cookey. This was for left toe gangrene. She subsequently underwent amputation of the toe by podiatry on 11/02/21. This healed well following. Since her bypass she has required Angiogram for recurrent stenosis in the bypass. Most recently in January of 2025 she was found to have an occluded bypass graft. She underwent lysis with laser atherectomy and mechanical thrombectomy with successful re cannulation of her bypass graft. Unfortunately on duplex today her graft is occluded again. This appears to be a silent occlusion. She is very minimally symptomatic. Unsure of when the graft occluded. ABI shows significant decrease in the LLE as expected with occluded bypass graft. With her current minimal symptoms and unknown time frame of occlusion would not recommend any aggressive intervention at this time. I will discuss her studies with Dr. Charlotte Cookey and call her with recommendations later this week - Continue Plavix , Aspirin  and statin  - Encourage her to walk and increase back up to her exercise regimen  - Tentatively will schedule her for 3 month follow up with ABI    Deneen Finical, PA-C [facility] [phone]  Clinic MD: Charlotte Cookey

## 2023-10-27 ENCOUNTER — Other Ambulatory Visit: Payer: Self-pay | Admitting: *Deleted

## 2023-10-27 DIAGNOSIS — I739 Peripheral vascular disease, unspecified: Secondary | ICD-10-CM

## 2023-10-27 DIAGNOSIS — T82898A Other specified complication of vascular prosthetic devices, implants and grafts, initial encounter: Secondary | ICD-10-CM

## 2023-12-03 ENCOUNTER — Other Ambulatory Visit: Payer: Self-pay | Admitting: Surgery

## 2024-02-01 ENCOUNTER — Ambulatory Visit

## 2024-02-01 ENCOUNTER — Encounter (HOSPITAL_COMMUNITY)

## 2024-02-01 DIAGNOSIS — I10 Essential (primary) hypertension: Secondary | ICD-10-CM | POA: Diagnosis not present

## 2024-02-01 DIAGNOSIS — Z0000 Encounter for general adult medical examination without abnormal findings: Secondary | ICD-10-CM | POA: Diagnosis not present

## 2024-02-01 DIAGNOSIS — I739 Peripheral vascular disease, unspecified: Secondary | ICD-10-CM | POA: Diagnosis not present

## 2024-02-01 DIAGNOSIS — S98132A Complete traumatic amputation of one left lesser toe, initial encounter: Secondary | ICD-10-CM | POA: Diagnosis not present

## 2024-02-01 DIAGNOSIS — E1159 Type 2 diabetes mellitus with other circulatory complications: Secondary | ICD-10-CM | POA: Diagnosis not present

## 2024-02-01 DIAGNOSIS — Z8571 Personal history of Hodgkin lymphoma: Secondary | ICD-10-CM | POA: Diagnosis not present

## 2024-02-01 DIAGNOSIS — E7800 Pure hypercholesterolemia, unspecified: Secondary | ICD-10-CM | POA: Diagnosis not present

## 2024-02-01 DIAGNOSIS — Z1331 Encounter for screening for depression: Secondary | ICD-10-CM | POA: Diagnosis not present

## 2024-02-01 DIAGNOSIS — E1165 Type 2 diabetes mellitus with hyperglycemia: Secondary | ICD-10-CM | POA: Diagnosis not present

## 2024-02-09 DIAGNOSIS — E11319 Type 2 diabetes mellitus with unspecified diabetic retinopathy without macular edema: Secondary | ICD-10-CM | POA: Diagnosis not present

## 2024-02-15 ENCOUNTER — Encounter (HOSPITAL_COMMUNITY)

## 2024-02-15 ENCOUNTER — Ambulatory Visit

## 2024-02-16 DIAGNOSIS — Z1231 Encounter for screening mammogram for malignant neoplasm of breast: Secondary | ICD-10-CM | POA: Diagnosis not present

## 2024-03-10 ENCOUNTER — Encounter (HOSPITAL_BASED_OUTPATIENT_CLINIC_OR_DEPARTMENT_OTHER): Payer: Self-pay

## 2024-03-10 ENCOUNTER — Emergency Department (HOSPITAL_BASED_OUTPATIENT_CLINIC_OR_DEPARTMENT_OTHER): Admitting: Radiology

## 2024-03-10 ENCOUNTER — Inpatient Hospital Stay (HOSPITAL_COMMUNITY)

## 2024-03-10 ENCOUNTER — Other Ambulatory Visit: Payer: Self-pay

## 2024-03-10 ENCOUNTER — Inpatient Hospital Stay (HOSPITAL_BASED_OUTPATIENT_CLINIC_OR_DEPARTMENT_OTHER)
Admission: EM | Admit: 2024-03-10 | Discharge: 2024-03-11 | DRG: 300 | Disposition: A | Discharge status: Home / Self Care | Attending: Internal Medicine | Admitting: Internal Medicine

## 2024-03-10 DIAGNOSIS — Z7984 Long term (current) use of oral hypoglycemic drugs: Secondary | ICD-10-CM | POA: Diagnosis not present

## 2024-03-10 DIAGNOSIS — I152 Hypertension secondary to endocrine disorders: Secondary | ICD-10-CM | POA: Diagnosis not present

## 2024-03-10 DIAGNOSIS — E1165 Type 2 diabetes mellitus with hyperglycemia: Secondary | ICD-10-CM | POA: Diagnosis present

## 2024-03-10 DIAGNOSIS — Z89422 Acquired absence of other left toe(s): Secondary | ICD-10-CM

## 2024-03-10 DIAGNOSIS — E1140 Type 2 diabetes mellitus with diabetic neuropathy, unspecified: Secondary | ICD-10-CM | POA: Diagnosis present

## 2024-03-10 DIAGNOSIS — T82868A Thrombosis of vascular prosthetic devices, implants and grafts, initial encounter: Secondary | ICD-10-CM | POA: Diagnosis not present

## 2024-03-10 DIAGNOSIS — Z7901 Long term (current) use of anticoagulants: Secondary | ICD-10-CM

## 2024-03-10 DIAGNOSIS — I70262 Atherosclerosis of native arteries of extremities with gangrene, left leg: Secondary | ICD-10-CM | POA: Diagnosis not present

## 2024-03-10 DIAGNOSIS — Z79899 Other long term (current) drug therapy: Secondary | ICD-10-CM | POA: Diagnosis not present

## 2024-03-10 DIAGNOSIS — L089 Local infection of the skin and subcutaneous tissue, unspecified: Secondary | ICD-10-CM | POA: Diagnosis not present

## 2024-03-10 DIAGNOSIS — E1152 Type 2 diabetes mellitus with diabetic peripheral angiopathy with gangrene: Principal | ICD-10-CM | POA: Diagnosis present

## 2024-03-10 DIAGNOSIS — E11621 Type 2 diabetes mellitus with foot ulcer: Secondary | ICD-10-CM | POA: Diagnosis present

## 2024-03-10 DIAGNOSIS — I70229 Atherosclerosis of native arteries of extremities with rest pain, unspecified extremity: Secondary | ICD-10-CM | POA: Diagnosis present

## 2024-03-10 DIAGNOSIS — Z833 Family history of diabetes mellitus: Secondary | ICD-10-CM

## 2024-03-10 DIAGNOSIS — Z7982 Long term (current) use of aspirin: Secondary | ICD-10-CM | POA: Diagnosis not present

## 2024-03-10 DIAGNOSIS — E7849 Other hyperlipidemia: Secondary | ICD-10-CM | POA: Diagnosis present

## 2024-03-10 DIAGNOSIS — L97529 Non-pressure chronic ulcer of other part of left foot with unspecified severity: Secondary | ICD-10-CM

## 2024-03-10 DIAGNOSIS — Z7902 Long term (current) use of antithrombotics/antiplatelets: Secondary | ICD-10-CM

## 2024-03-10 DIAGNOSIS — Z95828 Presence of other vascular implants and grafts: Secondary | ICD-10-CM | POA: Diagnosis not present

## 2024-03-10 DIAGNOSIS — E1159 Type 2 diabetes mellitus with other circulatory complications: Secondary | ICD-10-CM | POA: Diagnosis present

## 2024-03-10 DIAGNOSIS — L539 Erythematous condition, unspecified: Secondary | ICD-10-CM | POA: Diagnosis not present

## 2024-03-10 DIAGNOSIS — I739 Peripheral vascular disease, unspecified: Secondary | ICD-10-CM | POA: Diagnosis present

## 2024-03-10 DIAGNOSIS — L97429 Non-pressure chronic ulcer of left heel and midfoot with unspecified severity: Secondary | ICD-10-CM | POA: Diagnosis not present

## 2024-03-10 DIAGNOSIS — I70245 Atherosclerosis of native arteries of left leg with ulceration of other part of foot: Secondary | ICD-10-CM | POA: Diagnosis not present

## 2024-03-10 DIAGNOSIS — L03032 Cellulitis of left toe: Secondary | ICD-10-CM | POA: Diagnosis present

## 2024-03-10 DIAGNOSIS — E118 Type 2 diabetes mellitus with unspecified complications: Secondary | ICD-10-CM | POA: Diagnosis present

## 2024-03-10 DIAGNOSIS — E1169 Type 2 diabetes mellitus with other specified complication: Secondary | ICD-10-CM | POA: Diagnosis not present

## 2024-03-10 LAB — CBC WITH DIFFERENTIAL/PLATELET
Abs Immature Granulocytes: 0.09 K/uL — ABNORMAL HIGH (ref 0.00–0.07)
Basophils Absolute: 0.1 K/uL (ref 0.0–0.1)
Basophils Relative: 0 %
Eosinophils Absolute: 0.1 K/uL (ref 0.0–0.5)
Eosinophils Relative: 0 %
HCT: 37.9 % (ref 36.0–46.0)
Hemoglobin: 12.8 g/dL (ref 12.0–15.0)
Immature Granulocytes: 1 %
Lymphocytes Relative: 25 %
Lymphs Abs: 4.2 K/uL — ABNORMAL HIGH (ref 0.7–4.0)
MCH: 30.8 pg (ref 26.0–34.0)
MCHC: 33.8 g/dL (ref 30.0–36.0)
MCV: 91.3 fL (ref 80.0–100.0)
Monocytes Absolute: 1.4 K/uL — ABNORMAL HIGH (ref 0.1–1.0)
Monocytes Relative: 9 %
Neutro Abs: 10.8 K/uL — ABNORMAL HIGH (ref 1.7–7.7)
Neutrophils Relative %: 65 %
Platelets: 388 K/uL (ref 150–400)
RBC: 4.15 MIL/uL (ref 3.87–5.11)
RDW: 13 % (ref 11.5–15.5)
WBC: 16.6 K/uL — ABNORMAL HIGH (ref 4.0–10.5)
nRBC: 0 % (ref 0.0–0.2)

## 2024-03-10 LAB — COMPREHENSIVE METABOLIC PANEL WITH GFR
ALT: 17 U/L (ref 0–44)
AST: 16 U/L (ref 15–41)
Albumin: 4.5 g/dL (ref 3.5–5.0)
Alkaline Phosphatase: 84 U/L (ref 38–126)
Anion gap: 13 (ref 5–15)
BUN: 9 mg/dL (ref 8–23)
CO2: 26 mmol/L (ref 22–32)
Calcium: 10.3 mg/dL (ref 8.9–10.3)
Chloride: 96 mmol/L — ABNORMAL LOW (ref 98–111)
Creatinine, Ser: 0.54 mg/dL (ref 0.44–1.00)
GFR, Estimated: >60 mL/min (ref >60)
Glucose, Bld: 191 mg/dL — ABNORMAL HIGH (ref 70–99)
Potassium: 3.6 mmol/L (ref 3.5–5.1)
Sodium: 135 mmol/L (ref 135–145)
Total Bilirubin: 0.4 mg/dL (ref 0.0–1.2)
Total Protein: 7.9 g/dL (ref 6.5–8.1)

## 2024-03-10 LAB — LACTIC ACID, PLASMA: Lactic Acid, Venous: 1 mmol/L (ref 0.5–1.9)

## 2024-03-10 LAB — GLUCOSE, CAPILLARY: Glucose-Capillary: 255 mg/dL — ABNORMAL HIGH (ref 70–99)

## 2024-03-10 MED ORDER — LISINOPRIL 10 MG PO TABS
10.0000 mg | ORAL_TABLET | Freq: Every day | ORAL | Status: DC
  Administered 2024-03-11: 10 mg via ORAL
  Filled 2024-03-10: qty 1

## 2024-03-10 MED ORDER — SODIUM CHLORIDE 0.9 % IV SOLN
3.0000 g | Freq: Four times a day (QID) | INTRAVENOUS | Status: DC
  Administered 2024-03-10 – 2024-03-11 (×3): 3 g via INTRAVENOUS
  Filled 2024-03-10 (×5): qty 8

## 2024-03-10 MED ORDER — ONDANSETRON HCL 4 MG/2ML IJ SOLN
4.0000 mg | Freq: Once | INTRAMUSCULAR | Status: AC
  Administered 2024-03-10: 4 mg via INTRAVENOUS
  Filled 2024-03-10: qty 2

## 2024-03-10 MED ORDER — HYDROCODONE-ACETAMINOPHEN 5-325 MG PO TABS
1.0000 | ORAL_TABLET | ORAL | Status: DC | PRN
  Administered 2024-03-10: 1 via ORAL
  Administered 2024-03-11 (×2): 2 via ORAL
  Filled 2024-03-10: qty 1
  Filled 2024-03-10 (×2): qty 2

## 2024-03-10 MED ORDER — ACETAMINOPHEN 650 MG RE SUPP: 650.0000 mg | Freq: Four times a day (QID) | RECTAL | Status: DC | PRN

## 2024-03-10 MED ORDER — CLOPIDOGREL BISULFATE 75 MG PO TABS
75.0000 mg | ORAL_TABLET | Freq: Every day | ORAL | Status: DC
  Administered 2024-03-11: 75 mg via ORAL
  Filled 2024-03-10: qty 1

## 2024-03-10 MED ORDER — MORPHINE SULFATE (PF) 4 MG/ML IV SOLN
4.0000 mg | Freq: Once | INTRAVENOUS | Status: AC
  Administered 2024-03-10: 4 mg via INTRAVENOUS
  Filled 2024-03-10: qty 1

## 2024-03-10 MED ORDER — ACETAMINOPHEN 325 MG PO TABS
650.0000 mg | ORAL_TABLET | Freq: Four times a day (QID) | ORAL | Status: DC | PRN
  Administered 2024-03-11: 650 mg via ORAL
  Filled 2024-03-10: qty 2

## 2024-03-10 MED ORDER — BISACODYL 5 MG PO TBEC: 5.0000 mg | DELAYED_RELEASE_TABLET | Freq: Every day | ORAL | Status: DC | PRN

## 2024-03-10 MED ORDER — SENNOSIDES-DOCUSATE SODIUM 8.6-50 MG PO TABS: 1.0000 | ORAL_TABLET | Freq: Every evening | ORAL | Status: DC | PRN

## 2024-03-10 MED ORDER — INSULIN ASPART 100 UNIT/ML IJ SOLN
0.0000 [IU] | Freq: Three times a day (TID) | INTRAMUSCULAR | Status: DC
  Administered 2024-03-11: 1 [IU] via SUBCUTANEOUS
  Administered 2024-03-11: 5 [IU] via SUBCUTANEOUS

## 2024-03-10 MED ORDER — SODIUM CHLORIDE 0.9 % IV BOLUS
1000.0000 mL | Freq: Once | INTRAVENOUS | Status: AC
  Administered 2024-03-10: 1000 mL via INTRAVENOUS

## 2024-03-10 MED ORDER — SODIUM CHLORIDE 0.9 % IV SOLN
3.0000 g | Freq: Once | INTRAVENOUS | Status: AC
  Administered 2024-03-10: 3 g via INTRAVENOUS

## 2024-03-10 MED ORDER — VANCOMYCIN HCL IN DEXTROSE 1-5 GM/200ML-% IV SOLN
1000.0000 mg | Freq: Once | INTRAVENOUS | Status: AC
  Administered 2024-03-10: 1000 mg via INTRAVENOUS
  Filled 2024-03-10: qty 200

## 2024-03-10 MED ORDER — ASPIRIN 81 MG PO TBEC
81.0000 mg | DELAYED_RELEASE_TABLET | Freq: Every day | ORAL | Status: DC
  Administered 2024-03-10 – 2024-03-11 (×2): 81 mg via ORAL
  Filled 2024-03-10 (×2): qty 1

## 2024-03-10 MED ORDER — PRAVASTATIN SODIUM 40 MG PO TABS: 40.0000 mg | ORAL_TABLET | Freq: Every day | ORAL | Status: DC

## 2024-03-10 MED ORDER — EZETIMIBE 10 MG PO TABS
10.0000 mg | ORAL_TABLET | Freq: Every day | ORAL | Status: DC
  Administered 2024-03-11: 10 mg via ORAL
  Filled 2024-03-10: qty 1

## 2024-03-10 MED ORDER — SODIUM CHLORIDE 0.9% FLUSH
3.0000 mL | Freq: Two times a day (BID) | INTRAVENOUS | Status: DC
  Administered 2024-03-10 – 2024-03-11 (×2): 3 mL via INTRAVENOUS

## 2024-03-10 MED ORDER — ONDANSETRON HCL 4 MG/2ML IJ SOLN: 4.0000 mg | Freq: Four times a day (QID) | INTRAMUSCULAR | Status: DC | PRN

## 2024-03-10 MED ORDER — SODIUM CHLORIDE 0.9 % IV SOLN: INTRAVENOUS | Status: AC

## 2024-03-10 MED ORDER — ONDANSETRON HCL 4 MG PO TABS: 4.0000 mg | ORAL_TABLET | Freq: Four times a day (QID) | ORAL | Status: DC | PRN

## 2024-03-10 MED ORDER — METOPROLOL SUCCINATE ER 25 MG PO TB24
25.0000 mg | ORAL_TABLET | Freq: Every day | ORAL | Status: DC
  Administered 2024-03-10 – 2024-03-11 (×2): 25 mg via ORAL
  Filled 2024-03-10 (×2): qty 1

## 2024-03-10 MED ORDER — VANCOMYCIN HCL 1250 MG/250ML IV SOLN
1250.0000 mg | INTRAVENOUS | Status: AC
Start: 2024-03-11 — End: ?
  Administered 2024-03-11: 1250 mg via INTRAVENOUS
  Filled 2024-03-10: qty 250

## 2024-03-10 MED ORDER — INSULIN ASPART 100 UNIT/ML IJ SOLN
0.0000 [IU] | Freq: Every day | INTRAMUSCULAR | Status: DC
  Administered 2024-03-10: 3 [IU] via SUBCUTANEOUS

## 2024-03-10 NOTE — Plan of Care (Signed)
 Discussed case with Dr. Mannie via telephone. Agree with plan to admit to [facility] for further workup of chronic limb threatening ischemia. I have ordered an ABI. Tentatively planned for angiogram tomorrow with me to define surgical anatomy. Given history of fem-peroneal bypass with occlusion, suspect there are few good options remaining. Will see her on her arrival to [facility].  Debby SAILOR. Magda, MD [facility] Phone Number: [phone] 03/10/2024 9:41 AM

## 2024-03-10 NOTE — ED Triage Notes (Signed)
 Left foot erythema and edema with black wound underneath left great toe. PMH DM and neuropathy.

## 2024-03-10 NOTE — ED Provider Notes (Signed)
 [place] EMERGENCY DEPARTMENT AT [facility] Provider Note   CSN: [id number] Arrival date & time: 03/10/24  9158     Patient presents with: Foot Wound   Katie Hawkins is a 73 y.o. female.   Katie Hawkins is a 73 y.o. female with history of hypertension, hyperlipidemia, diabetes, peripheral arterial disease s/p left femoral to peroneal bypass graft, as well as amputation of the left third toe, who presents to the ED for evaluation of a wound on the bottom of her left foot.  She reports that the wound started as a crack at the base of her left big toe 1 to 2 months ago she did not think much of it and had been applying aloe vera and soaking it.  Over the past 3 to 4 weeks she has noticed increased pain and swelling that was initially improving after soaking it but has become worse.  Husband notes that she often has to get up in the middle of the night because it starts to cause her so much pain.  She reports that the foot has become increasingly red and swollen.  She has not had any fevers and chills that she is aware of.  Has not seen anyone for this wound or been on any antibiotics recently.  Reports has had a lot of previous issues with blood flow to that foot and has had multiple procedures with Dr. Serene with vascular surgery and also had a prior third toe amputation from Dr. Silva with podiatry.  Reports she is currently on aspirin  and Plavix .  No other aggravating or alleviating factors.  The history is provided by the patient and medical records.       Prior to Admission medications   Medication Sig Start Date End Date Taking? Authorizing Provider  Accu-Chek FastClix Lancets MISC  07/17/21   [provider]  apixaban  (ELIQUIS ) 5 MG TABS tablet 1/15 - 1/22 morning: take 10mg  (2 tablets) by mouth twice daily. 1/22 evening and on: take 5mg  (1 tablet) by mouth twice daily Patient not taking: Reported on 10/26/2023 06/10/23   Serene Gaile ORN, MD  aspirin  EC 81  MG tablet Take 1 tablet (81 mg total) by mouth daily. Swallow whole. 06/10/23   Serene Gaile ORN, MD  clopidogrel  (PLAVIX ) 75 MG tablet TAKE 1 TABLET(75 MG) BY MOUTH DAILY 12/03/23   Serene Gaile ORN, MD  ezetimibe  (ZETIA ) 10 MG tablet Take 10 mg by mouth daily. 09/06/21   [provider]  glimepiride (AMARYL) 2 MG tablet Take 2 mg by mouth at bedtime. 09/12/21   [provider]  glucose blood test strip 1 each by Other route as needed. Use as instructed. One Touch Delica Lancets used also.     [provider]  Glycerin-Hypromellose-PEG 400 (DRY EYE RELIEF DROPS OP) Place 1 drop into both eyes daily as needed (dry eye).    [provider]  lisinopril  (PRINIVIL ,ZESTRIL ) 10 MG tablet Take 10 mg by mouth daily.      [provider]  metFORMIN  (GLUCOPHAGE -XR) 500 MG 24 hr tablet Take 500 mg by mouth 2 (two) times daily. 07/17/21   [provider]  metoprolol  succinate (TOPROL -XL) 25 MG 24 hr tablet Take 25 mg by mouth daily. 01/10/15   [provider]  Multiple Vitamins-Minerals (MULTIVITAMIN GUMMIES ADULT PO) Take 2 tablets by mouth daily.    [provider]  pravastatin  (PRAVACHOL ) 40 MG tablet Take 40 mg by mouth Daily.  03/24/11   [provider]    Allergies: Linagliptin    Review of Systems  Constitutional:  Negative for chills and fever.  HENT:  Negative for postnasal drip.   Respiratory:  Negative for cough and shortness of breath.   Cardiovascular:  Negative for chest pain.  Gastrointestinal:  Negative for abdominal pain, nausea and vomiting.  Skin:  Positive for wound.    Updated Vital Signs BP (!) 133/103   Pulse (!) 123   Temp 98.4 F (36.9 C) (Oral)   Resp 15   Ht 5' 3 (1.6 m)   Wt 68.5 kg   SpO2 100%   BMI 26.75 kg/m   Physical Exam Vitals and nursing note reviewed.  Constitutional:      General: She is not in acute distress.    Appearance: Normal appearance. She is well-developed and normal  weight. She is not ill-appearing or diaphoretic.  HENT:     Head: Normocephalic and atraumatic.  Eyes:     General:        Right eye: No discharge.        Left eye: No discharge.  Cardiovascular:     Rate and Rhythm: Regular rhythm. Tachycardia present.     Pulses:          Dorsalis pedis pulses are 1+ on the right side and 0 on the left side.       Posterior tibial pulses are 1+ on the right side and detected w/ Doppler on the left side.     Heart sounds: Normal heart sounds.     Comments: Left foot with undetectable DP pulses via palpation or Doppler consistent with prior exams, PT pulse detected with Doppler on left foot.  DP and PT pulses 1+ on right foot. Pulmonary:     Effort: Pulmonary effort is normal. No respiratory distress.     Breath sounds: Normal breath sounds. No wheezing or rales.     Comments: Respirations equal and unlabored, patient able to speak in full sentences, lungs clear to auscultation bilaterally  Abdominal:     General: Bowel sounds are normal. There is no distension.     Palpations: Abdomen is soft. There is no mass.     Tenderness: There is no abdominal tenderness. There is no guarding.     Comments: Abdomen soft, nondistended, nontender to palpation in all quadrants without guarding or peritoneal signs  Musculoskeletal:        General: No deformity.     Cervical back: Neck supple.     Comments: Wound to the left foot as pictured below, area of necrotic tissue at the base of the first toe with surrounding yellow discoloration of the skin and skin breakdown.  Redness and swelling extending from the 1st and 2nd toe down to the proximal forefoot on both the top and bottom of the foot.  No expressible purulent drainage somewhat tender to palpation.  Skin:    General: Skin is warm and dry.     Capillary Refill: Capillary refill takes less than 2 seconds.  Neurological:     Mental Status: She is alert and oriented to person, place, and time.     Coordination:  Coordination normal.     Comments: Speech is clear, able to follow commands Moves extremities without ataxia, coordination intact  Psychiatric:        Mood and Affect: Mood normal.        Behavior: Behavior normal.          (all labs ordered are  listed, but only abnormal results are displayed) Labs Reviewed  COMPREHENSIVE METABOLIC PANEL WITH GFR - Abnormal; Notable for the following components:      Result Value   Chloride 96 (*)    Glucose, Bld 191 (*)    All other components within normal limits  CBC WITH DIFFERENTIAL/PLATELET - Abnormal; Notable for the following components:   WBC 16.6 (*)    Neutro Abs 10.8 (*)    Lymphs Abs 4.2 (*)    Monocytes Absolute 1.4 (*)    Abs Immature Granulocytes 0.09 (*)    All other components within normal limits  CULTURE, BLOOD (ROUTINE X 2)  CULTURE, BLOOD (ROUTINE X 2)  LACTIC ACID, PLASMA    DG Foot Complete Left Result Date: 03/10/2024 CLINICAL DATA:  Foot ulcer with erythema and edema in the region of the left great toe. EXAM: LEFT FOOT - COMPLETE 3+ VIEW COMPARISON:  None Available. FINDINGS: Status post middle toe amputation. Posttraumatic deformity noted fifth metatarsal. No bony destruction identified in the medial foot to suggest overt osteomyelitis. IMPRESSION: 1. Status post middle toe amputation. 2. No bony destruction identified in the medial foot to suggest overt osteomyelitis. MRI of the foot with and without contrast would be a more sensitive means to investigate. Electronically Signed   By: Camellia Candle M.D.   On: 03/10/2024 10:19     Procedures   Medications Ordered in the ED  sodium chloride  0.9 % bolus 1,000 mL (0 mLs Intravenous Stopped 03/10/24 1057)    And  0.9 %  sodium chloride  infusion ( Intravenous New Bag/Given 03/10/24 1101)  Ampicillin-Sulbactam (UNASYN) 3 g in sodium chloride  0.9 % 100 mL IVPB (0 g Intravenous Stopped 03/10/24 1021)    And  vancomycin (VANCOCIN) IVPB 1000 mg/200 mL premix (0 mg  Intravenous Stopped 03/10/24 1100)  morphine  (PF) 4 MG/ML injection 4 mg (4 mg Intravenous Given 03/10/24 0941)  ondansetron  (ZOFRAN ) injection 4 mg (4 mg Intravenous Given 03/10/24 0940)  morphine  (PF) 4 MG/ML injection 4 mg (4 mg Intravenous Given 03/10/24 1214)                                    Medical Decision Making Amount and/or Complexity of Data Reviewed Labs: ordered. Radiology: ordered.  Risk Prescription drug management. Decision regarding hospitalization.   73 year old woman arrives with wound to the bottom of the left foot with some necrotic tissue and surrounding erythema.  Noted to be tachycardic on arrival but without fever or hypotension.  History of significant peripheral arterial disease with previous femoral-peroneal bypass graft that has been chronically occluded since May but has not experienced acute symptoms until now.  Unable to Doppler DP pulse but this appears to be chronic for patient reading previous vascular notes, good pulse TP pulse noted on Doppler.  X-ray of the foot reviewed and interpreted independently, no obvious evidence of bony destruction or osteomyelitis.  Patient started on Unasyn and vancomycin for wound infection.  Vascular surgery consulted, Dr. Magda will see patient once she is transferred to [facility], ABIs ordered, will plan for likely procedure to assess patency of graft tomorrow.  Labs significant for leukocytosis of 16.6 but normal lactic acid, no significant electrolyte derangements aside from hyperglycemia at 191, no anion gap.  Blood cultures pending.  Case also discussed with Dr. Malvin with podiatry, they will see patient as well when she is admitted at [facility].  Will order  left foot MRI to follow-up appointment further assess for bony involvement or osteomyelitis.   Case discussed with Dr. Rondell Smith with [facility] who accepts patient for transfer and admission.       Final diagnoses:  Left foot infection     ED Discharge Orders     None          Alva Larraine FALCON, PA-C 03/10/24 1513    Mannie Pac T, DO 03/18/24 1622

## 2024-03-10 NOTE — ED Notes (Signed)
Lauren with [facility] called for transport 

## 2024-03-10 NOTE — Progress Notes (Signed)
 Pharmacy Antibiotic Note  Katie Hawkins is a 73 y.o. female admitted on 03/10/2024 with LLE foot infection, no osteomyelitis on Xray.  Pharmacy has been consulted for vancomycin and ampicillin/sulbactam dosing.  Received 1000mg  vancomycin at 10:00 10/16 Vancomycin 1250mg  Q 24 hr with Est AUC: 519 Scr used: 0.8 mg/dL; Vd coeff: 0.72 L/kg  Plan: Vancomycin 1250mg  q24hr  ampicillin/sulbactam 3g Q6hr Monitor cultures, clinical status, renal function, vancomycin level Narrow abx as able and f/u duration    Height: 5' 3 (160 cm) Weight: 71.5 kg (157 lb 10.1 oz) IBW/kg (Calculated) : 52.4  Temp (24hrs), Avg:98.8 F (37.1 C), Min:98.4 F (36.9 C), Max:99.1 F (37.3 C)  Recent Labs  Lab 03/10/24 0921  WBC 16.6*  CREATININE 0.54  LATICACIDVEN 1.0    Estimated Creatinine Clearance: 58.4 mL/min (by C-G formula based on SCr of 0.54 mg/dL).    Allergies  Allergen Reactions   Linagliptin     didn't like how she felt on this    Antimicrobials this admission: Vanc 10/16 >>  ampsulb 10/16 >>   Dose adjustments this admission: N/a  Microbiology results: 10/16 BCx:    Thank you for allowing pharmacy to be a part of this patient's care.  Jinnie Door, PharmD, BCPS, BCCP Clinical Pharmacist  Please check AMION for all [facility] Pharmacy phone numbers After 10:00 PM, call Main Pharmacy [phone]

## 2024-03-10 NOTE — Plan of Care (Addendum)
 [facility] transfer [facility] HTN, HLD, PVD s/p eft femoral to peroneal bypass graft presents with a wound of the left foot with redness and swelling to the foot.  X-ray did not show any signs of osteomyelitis.  WBC 16.6 with tachycardia meeting SIRS.  Vascular surgery consulted and plan on arteriogram tomorrow.  ABIs were ordered.  Started on empiric antibiotics of Unasyn and vancomycin.    Podiatry notified and placed orders for MRI to further assess for osteo.  Accepted to a medical telemetry bed

## 2024-03-10 NOTE — H&P (Signed)
 History and Physical    Katie Hawkins FMW:[id number] DOB: [birth year] DOA: 03/10/2024  PCP: Claudene Pellet, MD  Patient coming from: Home  I have personally briefly reviewed patient's old medical records in [facility]  Chief Complaint: Left foot wound  HPI: Katie Hawkins is a 73 y.o. female with medical history significant for PVD s/p left femoral to peroneal bypass graft (10/2021), T2DM, HTN, HLD who presented to the ED for evaluation of a wound to the bottom of her left foot.  Patient reports seeing a wound at the base of her left first toe plantar surface about 2 weeks ago.  She says it first appeared as a crack that she had been attempting to treat with aloe vera and soaking the foot.  Wound has worsened during this time with black appearing skin.  She denies any discharge from the wound.  She has seen new erythema involving her left first toe and the top distal half of her left foot.  [facility] ED Course  Labs/Imaging on admission: I have personally reviewed following labs and imaging studies.  Initial vitals showed BP 124/55, pulse 125, RR 16, temp 98.4 F, SpO2 97% on room air.  Labs showed WBC 16.6, hemoglobin 12.8, platelets 388, sodium 135, potassium 3.6, bicarb 26, BUN 9, creatinine 0.54, serum glucose 191, lactic acid 1.0.  Blood cultures in process.  Left foot x-ray did not show evidence of overt osteomyelitis.  Middle toe amputation changes noted.  Patient was given IV vancomycin and Unasyn, IV morphine  4 mg x 2.  EDP spoke with vascular surgery (Dr. Magda) who recommended admission to [facility] under hospitalist service and they will see in consultation.  EDP also discussed with podiatry (Dr. Malvin) who recommended obtaining MRI left foot and their team will also see in consultation per the hospitalist service was consulted for admission.  Review of Systems: All systems reviewed and are negative except as documented in history of present illness  above.   Past Medical History:  Diagnosis Date   Diabetes mellitus    History of blood transfusion    HTN (hypertension), benign 02/13/2012   Hypercholesterolemia    Hyperlipidemia    Hypertension    nodular lymphocyte predominate hodgkins lymphoma dx'd 10/2010   xrt comp 02/20/11   Peripheral vascular disease     Past Surgical History:  Procedure Laterality Date   ABDOMINAL AORTOGRAM W/LOWER EXTREMITY N/A 10/18/2021   Procedure: ABDOMINAL AORTOGRAM W/LOWER EXTREMITY;  Surgeon: Magda Debby SAILOR, MD;  Location: [facility] INVASIVE CV LAB;  Service: Cardiovascular;  Laterality: N/A;   ABDOMINAL AORTOGRAM W/LOWER EXTREMITY N/A 09/30/2022   Procedure: ABDOMINAL AORTOGRAM W/LOWER EXTREMITY;  Surgeon: Serene Gaile ORN, MD;  Location: [facility] INVASIVE CV LAB;  Service: Cardiovascular;  Laterality: N/A;   ABDOMINAL AORTOGRAM W/LOWER EXTREMITY N/A 06/09/2023   Procedure: ABDOMINAL AORTOGRAM W/LOWER EXTREMITY;  Surgeon: Serene Gaile ORN, MD;  Location: [facility] INVASIVE CV LAB;  Service: Cardiovascular;  Laterality: N/A;   AMPUTATION TOE Left 11/01/2021   Procedure: AMPUTATION TOE;  Surgeon: Silva Juliene SAUNDERS, DPM;  Location: [facility] OR;  Service: Podiatry;  Laterality: Left;  will do own block   APPLICATION OF WOUND VAC Left 10/30/2021   Procedure: APPLICATION OF WOUND VAC;  Surgeon: Serene Gaile ORN, MD;  Location: [facility] OR;  Service: Vascular;  Laterality: Left;   BYPASS GRAFT POPLITEAL TO TIBIAL Left 10/30/2021   Procedure: LEFT FEMORAL TO PERONEAL BYPASS GRAFT USING VEIN;  Surgeon: Serene Gaile ORN, MD;  Location: [facility]  OR;  Service: Vascular;  Laterality: Left;   CESAREAN SECTION     2   DILATION AND CURETTAGE OF UTERUS     PERIPHERAL VASCULAR ATHERECTOMY  06/09/2023   Procedure: PERIPHERAL VASCULAR ATHERECTOMY;  Surgeon: Serene Gaile ORN, MD;  Location: [facility] INVASIVE CV LAB;  Service: Cardiovascular;;   PERIPHERAL VASCULAR BALLOON ANGIOPLASTY  09/30/2022   Procedure: PERIPHERAL VASCULAR BALLOON ANGIOPLASTY;  Surgeon: Serene Gaile ORN, MD;  Location: [facility] INVASIVE CV LAB;  Service: Cardiovascular;;   PERIPHERAL VASCULAR BALLOON ANGIOPLASTY Left 06/10/2023   Procedure: PERIPHERAL VASCULAR BALLOON ANGIOPLASTY;  Surgeon: Lanis Fonda BRAVO, MD;  Location: [facility] INVASIVE CV LAB;  Service: Cardiovascular;  Laterality: Left;   PERIPHERAL VASCULAR THROMBECTOMY  06/09/2023   Procedure: PERIPHERAL VASCULAR THROMBECTOMY;  Surgeon: Serene Gaile ORN, MD;  Location: [facility] INVASIVE CV LAB;  Service: Cardiovascular;;   PERIPHERAL VASCULAR THROMBECTOMY Left 06/10/2023   Procedure: LYSIS RECHECK;  Surgeon: Lanis Fonda BRAVO, MD;  Location: [facility] INVASIVE CV LAB;  Service: Cardiovascular;  Laterality: Left;   VEIN HARVEST Left 10/30/2021   Procedure: VEIN HARVEST;  Surgeon: Serene Gaile ORN, MD;  Location[facility] OR;  Service: Vascular;  Laterality: Left;    Social History: Social History   Tobacco Use   Smoking status: Never   Smokeless tobacco: Never  Vaping Use   Vaping status: Never Used  Substance Use Topics   Alcohol use: Yes    Alcohol/week: 0.0 standard drinks of alcohol    Comment: occasionally   Drug use: No     Allergies  Allergen Reactions   Linagliptin     didn't like how she felt on this    Family History  Problem Relation Age of Onset   Diabetes Father    Cancer Maternal Grandfather        lung   Cancer Paternal Grandfather        leukemia     Prior to Admission medications   Medication Sig Start Date End Date Taking? Authorizing Provider  Accu-Chek FastClix Lancets MISC  07/17/21   [provider]  apixaban  (ELIQUIS ) 5 MG TABS tablet 1/15 - 1/22 morning: take 10mg  (2 tablets) by mouth twice daily. 1/22 evening and on: take 5mg  (1 tablet) by mouth twice daily Patient not taking: Reported on 10/26/2023 06/10/23   Serene Gaile ORN, MD  aspirin  EC 81 MG tablet Take 1 tablet (81 mg total) by mouth daily. Swallow whole. 06/10/23   Serene Gaile ORN, MD  clopidogrel  (PLAVIX ) 75 MG tablet TAKE 1 TABLET(75 MG) BY MOUTH  DAILY 12/03/23   Serene Gaile ORN, MD  ezetimibe  (ZETIA ) 10 MG tablet Take 10 mg by mouth daily. 09/06/21   [provider]  glimepiride (AMARYL) 2 MG tablet Take 2 mg by mouth at bedtime. 09/12/21   [provider]  glucose blood test strip 1 each by Other route as needed. Use as instructed. One Touch Delica Lancets used also.     [provider]  Glycerin-Hypromellose-PEG 400 (DRY EYE RELIEF DROPS OP) Place 1 drop into both eyes daily as needed (dry eye).    [provider]  lisinopril  (PRINIVIL ,ZESTRIL ) 10 MG tablet Take 10 mg by mouth daily.      [provider]  metFORMIN  (GLUCOPHAGE -XR) 500 MG 24 hr tablet Take 500 mg by mouth 2 (two) times daily. 07/17/21   [provider]  metoprolol  succinate (TOPROL -XL) 25 MG 24 hr tablet Take 25 mg by mouth daily. 01/10/15   [provider]  Multiple  Vitamins-Minerals (MULTIVITAMIN GUMMIES ADULT PO) Take 2 tablets by mouth daily.    [provider]  pravastatin  (PRAVACHOL ) 40 MG tablet Take 40 mg by mouth Daily.  03/24/11   [provider]    Physical Exam: Vitals:   03/10/24 1400 03/10/24 1500 03/10/24 1748 03/10/24 2055  BP: 118/65 124/68 122/61 101/65  Pulse: (!) 107 (!) 103 (!) 104 (!) 120  Resp: 20 19 12 20   Temp: 98.9 F (37.2 C)  99.1 F (37.3 C)   TempSrc: Oral  Oral   SpO2: 100% 98% 97% 95%  Weight:   71.5 kg   Height:   5' 3 (1.6 m)    Constitutional: Resting in bed, NAD, calm, comfortable Eyes: EOMI, lids and conjunctivae normal ENMT: Mucous membranes are moist. Posterior pharynx clear of any exudate or lesions.Normal dentition.  Neck: normal, supple, no masses. Respiratory: clear to auscultation bilaterally, no wheezing, no crackles. Normal respiratory effort. No accessory muscle use.  Cardiovascular: Regular rate and rhythm, no murmurs / rubs / gallops. No extremity edema.  Pedal pulses difficult to palpate on the left Abdomen: no tenderness, no masses  palpated. Musculoskeletal: S/p left third toe amputation Skin: Erythema left first toe and distal half of dorsal foot.  Linear wound at base of first toe plantar surface with ischemic appearing tissue.  No active discharge. Neurologic: Sensation diminished to toes. Strength 5/5 in all 4.  Psychiatric: Normal judgment and insight. Alert and oriented x 3. Normal mood.         EKG: Not performed.  Assessment/Plan Principal Problem:   Chronic limb-threatening ischemia (HCC) Active Problems:   Type 2 diabetes mellitus with complication, without long-term current use of insulin  (HCC)   Hyperlipidemia associated with type 2 diabetes mellitus (HCC)   Hypertension associated with diabetes (HCC)   PAD (peripheral artery disease)   Katie Hawkins is a 73 y.o. female with medical history significant for PVD s/p left femoral to peroneal bypass graft (10/2021), T2DM, HTN, HLD who is admitted with acute on chronic left lower extremity critical limb ischemia with superimposed cellulitis.  Assessment and Plan: PVD with acute on chronic critical limb ischemia of left LLE with superimposed cellulitis S/p left femoral to peroneal bypass graft 10/2021: Patient presenting with leukocytosis and tachycardia. Left foot x-ray does not show evidence of osteomyelitis.  She has signs of superimposed cellulitis. - Vascular surgery and podiatry to consult - Continue IV vancomycin and Unasyn - Follow blood cultures - Follow MRI left foot - Vascular surgery planning on angiogram tomorrow - Continue aspirin , statin, Zetia   Type 2 diabetes: Holding home metformin  and Amaryl.  Placed on SSI.  Hypertension: Continue lisinopril  and Toprol -XL.  Hyperlipidemia: Continue pravastatin  and Zetia .   DVT prophylaxis: SCDs Start: 03/10/24 2012 Code Status: Full code, confirmed with patient on admission Family Communication: Discussed with patient, she has discussed with family Disposition Plan: From home, dispo  pending clinical progress Consults called: Vascular surgery, podiatry Severity of Illness: The appropriate patient status for this patient is INPATIENT. Inpatient status is judged to be reasonable and necessary in order to provide the required intensity of service to ensure the patient's safety. The patient's presenting symptoms, physical exam findings, and initial radiographic and laboratory data in the context of their chronic comorbidities is felt to place them at high risk for further clinical deterioration. Furthermore, it is not anticipated that the patient will be medically stable for discharge from the hospital within 2 midnights of admission.   * I certify  that at the point of admission it is my clinical judgment that the patient will require inpatient hospital care spanning beyond 2 midnights from the point of admission due to high intensity of service, high risk for further deterioration and high frequency of surveillance required.DEWAINE Jorie Blanch MD [facility]  If 7PM-7AM, please contact night-coverage [email or web address]  03/10/2024, 10:08 PM

## 2024-03-10 NOTE — Hospital Course (Signed)
 Katie Hawkins is a 73 y.o. female with medical history significant for PVD s/p left femoral to peroneal bypass graft (10/2021), T2DM, HTN, HLD who is admitted with acute on chronic left lower extremity critical limb ischemia with superimposed cellulitis.

## 2024-03-11 ENCOUNTER — Encounter (HOSPITAL_COMMUNITY)
Admission: EM | Disposition: A | Discharge status: Home / Self Care | Payer: Self-pay | Source: Home / Self Care | Attending: Internal Medicine

## 2024-03-11 ENCOUNTER — Inpatient Hospital Stay (HOSPITAL_COMMUNITY)

## 2024-03-11 ENCOUNTER — Other Ambulatory Visit (HOSPITAL_COMMUNITY): Payer: Self-pay

## 2024-03-11 DIAGNOSIS — I70229 Atherosclerosis of native arteries of extremities with rest pain, unspecified extremity: Secondary | ICD-10-CM

## 2024-03-11 DIAGNOSIS — T82868A Thrombosis of vascular prosthetic devices, implants and grafts, initial encounter: Secondary | ICD-10-CM

## 2024-03-11 DIAGNOSIS — L03032 Cellulitis of left toe: Secondary | ICD-10-CM

## 2024-03-11 DIAGNOSIS — I70245 Atherosclerosis of native arteries of left leg with ulceration of other part of foot: Secondary | ICD-10-CM | POA: Diagnosis not present

## 2024-03-11 DIAGNOSIS — L97529 Non-pressure chronic ulcer of other part of left foot with unspecified severity: Secondary | ICD-10-CM | POA: Diagnosis not present

## 2024-03-11 DIAGNOSIS — Z95828 Presence of other vascular implants and grafts: Secondary | ICD-10-CM

## 2024-03-11 DIAGNOSIS — L089 Local infection of the skin and subcutaneous tissue, unspecified: Secondary | ICD-10-CM

## 2024-03-11 HISTORY — PX: LOWER EXTREMITY ANGIOGRAPHY: CATH118251

## 2024-03-11 HISTORY — PX: ABDOMINAL AORTOGRAM W/LOWER EXTREMITY: CATH118223

## 2024-03-11 LAB — CBC
HCT: 34.1 % — ABNORMAL LOW (ref 36.0–46.0)
Hemoglobin: 11.1 g/dL — ABNORMAL LOW (ref 12.0–15.0)
MCH: 30.2 pg (ref 26.0–34.0)
MCHC: 32.6 g/dL (ref 30.0–36.0)
MCV: 92.9 fL (ref 80.0–100.0)
Platelets: 327 K/uL (ref 150–400)
RBC: 3.67 MIL/uL — ABNORMAL LOW (ref 3.87–5.11)
RDW: 13.1 % (ref 11.5–15.5)
WBC: 10.9 K/uL — ABNORMAL HIGH (ref 4.0–10.5)
nRBC: 0 % (ref 0.0–0.2)

## 2024-03-11 LAB — BASIC METABOLIC PANEL WITH GFR
Anion gap: 11 (ref 5–15)
BUN: 8 mg/dL (ref 8–23)
CO2: 25 mmol/L (ref 22–32)
Calcium: 8.6 mg/dL — ABNORMAL LOW (ref 8.9–10.3)
Chloride: 101 mmol/L (ref 98–111)
Creatinine, Ser: 0.51 mg/dL (ref 0.44–1.00)
GFR, Estimated: >60 mL/min (ref >60)
Glucose, Bld: 130 mg/dL — ABNORMAL HIGH (ref 70–99)
Potassium: 3.4 mmol/L — ABNORMAL LOW (ref 3.5–5.1)
Sodium: 137 mmol/L (ref 135–145)

## 2024-03-11 LAB — GLUCOSE, CAPILLARY
Glucose-Capillary: 145 mg/dL — ABNORMAL HIGH (ref 70–99)
Glucose-Capillary: 130 mg/dL — ABNORMAL HIGH (ref 70–99)
Glucose-Capillary: 285 mg/dL — ABNORMAL HIGH (ref 70–99)

## 2024-03-11 LAB — HEMOGLOBIN A1C
Hgb A1c MFr Bld: 8.6 % — ABNORMAL HIGH (ref 4.8–5.6)
Mean Plasma Glucose: 200.12 mg/dL

## 2024-03-11 LAB — MRSA NEXT GEN BY PCR, NASAL: MRSA by PCR Next Gen: DETECTED — AB

## 2024-03-11 SURGERY — ABDOMINAL AORTOGRAM W/LOWER EXTREMITY
Anesthesia: LOCAL

## 2024-03-11 MED ORDER — FENTANYL CITRATE (PF) 100 MCG/2ML IJ SOLN
INTRAMUSCULAR | Status: AC
  Filled 2024-03-11: qty 2

## 2024-03-11 MED ORDER — DOXYCYCLINE HYCLATE 100 MG PO TABS
100.0000 mg | ORAL_TABLET | Freq: Two times a day (BID) | ORAL | 0 refills | Status: AC
  Filled 2024-03-11: qty 14, 7d supply, fill #0

## 2024-03-11 MED ORDER — LABETALOL HCL 5 MG/ML IV SOLN: 10.0000 mg | INTRAVENOUS | Status: DC | PRN

## 2024-03-11 MED ORDER — HYDROCODONE-ACETAMINOPHEN 5-325 MG PO TABS
1.0000 | ORAL_TABLET | Freq: Four times a day (QID) | ORAL | 0 refills | Status: DC | PRN
  Filled 2024-03-11: qty 20, 5d supply, fill #0

## 2024-03-11 MED ORDER — ATORVASTATIN CALCIUM 40 MG PO TABS: 40.0000 mg | ORAL_TABLET | Freq: Every day | ORAL | Status: DC

## 2024-03-11 MED ORDER — SODIUM CHLORIDE 0.9 % IV SOLN: INTRAVENOUS | Status: DC

## 2024-03-11 MED ORDER — SODIUM CHLORIDE 0.9 % IV SOLN: 250.0000 mL | INTRAVENOUS | Status: DC | PRN

## 2024-03-11 MED ORDER — LIDOCAINE HCL (PF) 1 % IJ SOLN
INTRAMUSCULAR | Status: AC
  Filled 2024-03-11: qty 30

## 2024-03-11 MED ORDER — ATORVASTATIN CALCIUM 40 MG PO TABS
40.0000 mg | ORAL_TABLET | Freq: Every day | ORAL | 0 refills | Status: AC
  Filled 2024-03-11: qty 30, 30d supply, fill #0

## 2024-03-11 MED ORDER — AMOXICILLIN-POT CLAVULANATE 875-125 MG PO TABS
1.0000 | ORAL_TABLET | Freq: Two times a day (BID) | ORAL | 0 refills | Status: AC
  Filled 2024-03-11: qty 14, 7d supply, fill #0

## 2024-03-11 MED ORDER — IODIXANOL 320 MG/ML IV SOLN
INTRAVENOUS | Status: DC | PRN
  Administered 2024-03-11: 45 mL

## 2024-03-11 MED ORDER — MIDAZOLAM HCL 2 MG/2ML IJ SOLN
INTRAMUSCULAR | Status: AC
  Filled 2024-03-11: qty 2

## 2024-03-11 MED ORDER — FENTANYL CITRATE (PF) 100 MCG/2ML IJ SOLN
INTRAMUSCULAR | Status: DC | PRN
  Administered 2024-03-11: 25 ug via INTRAVENOUS

## 2024-03-11 MED ORDER — JUVEN PO PACK: 1.0000 | PACK | Freq: Two times a day (BID) | ORAL | Status: DC

## 2024-03-11 MED ORDER — HYDRALAZINE HCL 20 MG/ML IJ SOLN: 5.0000 mg | INTRAMUSCULAR | Status: DC | PRN

## 2024-03-11 MED ORDER — MIDAZOLAM HCL (PF) 2 MG/2ML IJ SOLN
INTRAMUSCULAR | Status: DC | PRN
  Administered 2024-03-11: 1 mg via INTRAVENOUS

## 2024-03-11 MED ORDER — HEPARIN (PORCINE) IN NACL 1000-0.9 UT/500ML-% IV SOLN
INTRAVENOUS | Status: DC | PRN
  Administered 2024-03-11 (×2): 500 mL

## 2024-03-11 MED ORDER — SODIUM CHLORIDE 0.9% FLUSH: 3.0000 mL | INTRAVENOUS | Status: DC | PRN

## 2024-03-11 MED ORDER — SODIUM CHLORIDE 0.9 % WEIGHT BASED INFUSION: 1.0000 mL/kg/h | INTRAVENOUS | Status: DC

## 2024-03-11 MED ORDER — SODIUM CHLORIDE 0.9% FLUSH: 3.0000 mL | Freq: Two times a day (BID) | INTRAVENOUS | Status: DC

## 2024-03-11 MED ORDER — POTASSIUM CHLORIDE CRYS ER 20 MEQ PO TBCR
40.0000 meq | EXTENDED_RELEASE_TABLET | Freq: Once | ORAL | Status: AC
  Administered 2024-03-11: 40 meq via ORAL
  Filled 2024-03-11: qty 2

## 2024-03-11 MED ORDER — LIDOCAINE HCL (PF) 1 % IJ SOLN
INTRAMUSCULAR | Status: DC | PRN
  Administered 2024-03-11: 15 mL via INTRADERMAL

## 2024-03-11 SURGICAL SUPPLY — 8 items
CATH OMNI FLUSH 5F 65CM (CATHETERS) IMPLANT
KIT MICROPUNCTURE NIT STIFF (SHEATH) IMPLANT
KIT SYRINGE INJ CVI SPIKEX1 (MISCELLANEOUS) IMPLANT
SET ATX-X65L (MISCELLANEOUS) IMPLANT
SHEATH PINNACLE 5F 10CM (SHEATH) IMPLANT
SHEATH PROBE COVER 6X72 (BAG) IMPLANT
TRAY PV CATH (CUSTOM PROCEDURE TRAY) ×1 IMPLANT
WIRE BENTSON .035X145CM (WIRE) IMPLANT

## 2024-03-11 NOTE — Progress Notes (Signed)
 Patient walked 230 feet no distresss no sob  no bleeding   patient resting additional 15 will recheck for bleeding

## 2024-03-11 NOTE — Discharge Summary (Signed)
 Physician Discharge Summary   Patient: Katie Hawkins MRN: [id number] DOB: [birth year]  Admit date:     03/10/2024  Discharge date: 03/11/24  Discharge Physician: Garnette Pelt   PCP: Claudene Pellet, MD   Recommendations at discharge:    Follow up with PCP in 2-3 weeks Follow up with Vascular Surgery as will be scheduled for outpt bypass Follow up with Podiatry as will be scheduled for after Vascular bypass  Discharge Diagnoses: Principal Problem:   Chronic limb-threatening ischemia (HCC) Active Problems:   Type 2 diabetes mellitus with complication, without long-term current use of insulin  (HCC)   Hyperlipidemia associated with type 2 diabetes mellitus (HCC)   Hypertension associated with diabetes (HCC)   PAD (peripheral artery disease)   Ulcer of left foot (HCC)   Cellulitis of great toe of left foot  Resolved Problems:   * No resolved hospital problems. *  Hospital Course: Katie Hawkins is a 73 y.o. female with medical history significant for PVD s/p left femoral to peroneal bypass graft (10/2021), T2DM, HTN, HLD who is admitted with acute on chronic left lower extremity critical limb ischemia with superimposed cellulitis.  Assessment and Plan: PVD with acute on chronic critical limb ischemia of left LLE with superimposed cellulitis S/p left femoral to peroneal bypass graft 10/2021: Patient presenting with leukocytosis and tachycardia. Left foot x-ray does not show evidence of osteomyelitis.  She has signs of superimposed cellulitis. - Vascular surgery and podiatry to consulted - Was initially continued on IV vancomycin and Unasyn - MRI left foot with findings suggestive of cellulitis and early osteo - Pt underwent angiogram 10/17 by Vascular Surgery. Recs for f/u outpatient bypass - Continue aspirin , statin, Zetia  - Discussed with Podiatry. Ok to d/c from [employer] standpoint. Pt to f/u with Podiatry following bypass   Type 2 diabetes: Held home metformin  and Amaryl  while in hospital.  -Cont home meds on d/c   Hypertension: Continue lisinopril  and Toprol -XL.   Hyperlipidemia: Continue statin and Zetia .       Consultants: Vascular Surgery, Podiatry Procedures performed: Aortogram  Disposition: Home Diet recommendation:  Carb modified diet DISCHARGE MEDICATION: Allergies as of 03/11/2024       Reactions   Linagliptin    didn't like how she felt on this        Medication List     STOP taking these medications    apixaban  5 MG Tabs tablet Commonly known as: ELIQUIS    pravastatin  40 MG tablet Commonly known as: PRAVACHOL        TAKE these medications    Accu-Chek FastClix Lancets Misc   amoxicillin-clavulanate 875-125 MG tablet Commonly known as: AUGMENTIN Take 1 tablet by mouth 2 (two) times daily for 7 days.   aspirin  EC 81 MG tablet Take 1 tablet (81 mg total) by mouth daily. Swallow whole.   atorvastatin 40 MG tablet Commonly known as: LIPITOR Take 1 tablet (40 mg total) by mouth at bedtime.   clopidogrel  75 MG tablet Commonly known as: PLAVIX  TAKE 1 TABLET(75 MG) BY MOUTH DAILY   doxycycline  100 MG tablet Commonly known as: VIBRA -TABS Take 1 tablet (100 mg total) by mouth 2 (two) times daily for 7 days.   DRY EYE RELIEF DROPS OP Place 1 drop into both eyes daily as needed (dry eye).   ezetimibe  10 MG tablet Commonly known as: ZETIA  Take 10 mg by mouth daily.   glimepiride 2 MG tablet Commonly known as: AMARYL Take 2 mg by mouth at bedtime.  glucose blood test strip 1 each by Other route as needed. Use as instructed. One Touch Delica Lancets used also.   HYDROcodone -acetaminophen  5-325 MG tablet Commonly known as: NORCO/VICODIN Take 1 tablet by mouth every 6 (six) hours as needed for severe pain (pain score 7-10).   lisinopril  10 MG tablet Commonly known as: ZESTRIL  Take 10 mg by mouth daily.   metFORMIN  500 MG 24 hr tablet Commonly known as: GLUCOPHAGE -XR Take 500 mg by mouth 2 (two) times  daily.   metoprolol  succinate 25 MG 24 hr tablet Commonly known as: TOPROL -XL Take 25 mg by mouth daily.   MULTIVITAMIN GUMMIES ADULT PO Take 2 tablets by mouth daily.        Follow-up Information     Claudene Pellet, MD Follow up in 2 week(s).   Specialty: Family Medicine Why: Hospital follow up Contact information: [address], [address] [place] [address] [phone]         Magda Debby SAILOR, MD Follow up.   Specialties: Vascular Surgery, Interventional Cardiology Why: Hospital follow up, as will be scheduled Contact information: [address] [place] [address] [phone]         Malvin Marsa FALCON, DPM Follow up.   Specialty: Podiatry Why: Hospital follow up, as will be scheduled Contact information: [address] [place] [address] [phone]                Discharge Exam: Fredricka Weights   03/10/24 9146 03/10/24 1748  Weight: 68.5 kg 71.5 kg   General exam: Awake, laying in bed, in nad Respiratory system: Normal respiratory effort, no wheezing Cardiovascular system: regular rate, s1, s2 Gastrointestinal system: Soft, nondistended, positive BS Central nervous system: CN2-12 grossly intact, strength intact Extremities: Perfused, no clubbing Skin: Normal skin turgor, no notable skin lesions seen Psychiatry: Mood normal // no visual hallucinations   Condition at discharge: fair  The results of significant diagnostics from this hospitalization (including imaging, microbiology, ancillary and laboratory) are listed below for reference.   Imaging Studies: PERIPHERAL VASCULAR CATHETERIZATION Result Date: 03/11/2024 Table formatting from the original result was not included. DATE OF SERVICE: 03/11/2024  PATIENT:  Katie Hawkins  73 y.o. female  PRE-OPERATIVE DIAGNOSIS:  Atherosclerosis of native arteries of left lower extremity causing ulceration  POST-OPERATIVE DIAGNOSIS:  Same  PROCEDURE:  1)  Ultrasound guided right common femoral artery access (CPT [phone]) 2) Aortogram (CPT [phone]) 3) Left lower extremity angiogram with second order cannulation (CPT 36246) 4) Conscious sedation (18 minutes) (CPT 99152)  SURGEON:  Debby SAILOR. Magda, MD  ASSISTANT: none  ANESTHESIA:   local and IV sedation  ESTIMATED BLOOD LOSS: min  LOCAL MEDICATIONS USED:  NONE  COUNTS: confirmed correct.  PATIENT DISPOSITION:  PACU - hemodynamically stable.  Delay start of Pharmacological VTE agent (>24hrs) due to surgical blood loss or risk of bleeding: no  INDICATION FOR PROCEDURE: Katie Hawkins is a 73 y.o. female with left foot ischemic ulceration in setting of known PAD and thrombosed left fem-peroneal bypass. After careful discussion of risks, benefits, and alternatives the patient was offered angiogram. The patient understood and wished to proceed.  OPERATIVE FINDINGS:  Left renal artery: patent Right renal artery: patent Infrarenal aorta: patent Left common iliac artery: patent Right common iliac artery: patent Left internal iliac artery: patent Right internal iliac artery: patent Left external iliac artery: patent Right external iliac artery: patent Left common femoral artery: patent Right common femoral artery: not studied Left profunda  femoris artery: patent Right profunda femoris artery: not studied Left superficial femoral artery: patent, but diseased Right superficial femoral artery: not studied Left popliteal artery: occluded Right popliteal artery: not studied Left anterior tibial artery: occluded Right anterior tibial artery: not studied Left tibioperoneal trunk: patent Right tibioperoneal trunk: not studied Left peroneal artery: patent Right peroneal artery: not studied Left posterior tibial artery: occluded Right posterior tibial artery: not studied Left pedal circulation: fills via peroneal collaterals Right pedal circulation: not studied  GLASS score. FP 4. IP 0. Stage III  WIfI score. 1 / 3 / 1. Stage III.   DESCRIPTION OF PROCEDURE: After identification of the patient in the pre-operative holding area, the patient was transferred to the operating room. The patient was positioned supine on the operating room table. Anesthesia was induced. The groins was prepped and draped in standard fashion. A surgical pause was performed confirming correct patient, procedure, and operative location.  The right groin was anesthetized with subcutaneous injection of 1% lidocaine . Using ultrasound guidance, the right common femoral artery was accessed with micropuncture technique.  Fluoroscopy was used to confirm cannulation over the femoral head. The 48F micropuncture sheath was upsized to 80F.  A Benson wire was advanced into the distal aorta. Over the wire an omni flush catheter was advanced to the level of L2. Aortogram was performed - see above for details.  The left common iliac artery was selected with an omniflush catheter and bentson guidewire. The wire was advanced into the common femoral artery. Over the wire the omni flush catheter was advanced into the external iliac artery. Selective angiography was performed - see above for details.  The sheath was left in place to be removed in the recovery area.  Conscious sedation was administered with the use of IV fentanyl  and midazolam  under continuous physician and nurse monitoring.  Heart rate, blood pressure, and oxygen saturation were continuously monitored.  Total sedation time was 18 minutes  Upon completion of the case instrument and sharps counts were confirmed correct. The patient was transferred to the PACU in good condition. I was present for all portions of the procedure.  PLAN: ASA / Statin. Needs L femoral - peroneal bypass. This will likely be with PTFE. Can be done as an outpatient.  Debby SAILOR. Magda, MD [facility] Phone Number: [phone] 03/11/2024 8:52 AM   MR FOOT LEFT WO CONTRAST Result Date: 03/11/2024 CLINICAL  DATA:  Foot ulcer with erythema and edema EXAM: MRI OF THE LEFT FOOT WITHOUT CONTRAST TECHNIQUE: Multiplanar, multisequence MR imaging of the left foot from the midfoot through the toes was performed. No intravenous contrast was administered. COMPARISON:  Radiographs 03/10/2024 FINDINGS: Despite efforts by the technologist and patient, motion artifact is present on today's exam and could not be eliminated. This reduces exam sensitivity and specificity. Bones/Joint/Cartilage Abnormal high STIR and low T1 signal in the tuft of the distal phalanx great toe suspicious for early osteomyelitis. 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 No additional significant marrow edema in the forefoot. Ligaments Lisfranc ligament intact. Muscles and Tendons Small amount of fluid signal tracks along the flexor digitorum tendons along the midfoot as on image 7 series 9, suggesting extensor digitorum tenosynovitis. Subtle nodularity of the medial band of the plantar fascia distally, cannot exclude mild plantar fibromatosis. Soft tissues Low-grade diffuse subcutaneous edema in the forefoot more confluent below the first MTP joint and in the great toe, cellulitis is a distinct possibility. IMPRESSION: 1. Abnormal  marrow edema in the tuft of the distal phalanx great toe suspicious for early osteomyelitis. 2. Low-grade diffuse subcutaneous edema in the forefoot more confluent below the first MTP joint and in the great toe, cellulitis is a distinct possibility. 3. Small amount of fluid signal tracks along the flexor digitorum tendons along the midfoot, suggesting extensor digitorum tenosynovitis. 4. Subtle nodularity of the medial band of the plantar fascia distally, cannot exclude mild plantar fibromatosis. Electronically Signed   By: Ryan Salvage M.D.   On: 03/11/2024 08:32   DG Foot Complete Left Result Date: 03/10/2024 CLINICAL DATA:  Foot ulcer with erythema and edema in the region of the left great toe. EXAM: LEFT FOOT -  COMPLETE 3+ VIEW COMPARISON:  None Available. FINDINGS: Status post middle toe amputation. Posttraumatic deformity noted fifth metatarsal. No bony destruction identified in the medial foot to suggest overt osteomyelitis. IMPRESSION: 1. Status post middle toe amputation. 2. No bony destruction identified in the medial foot to suggest overt osteomyelitis. MRI of the foot with and without contrast would be a more sensitive means to investigate. Electronically Signed   By: Camellia Candle M.D.   On: 03/10/2024 10:19    Microbiology: Results for orders placed or performed during the hospital encounter of 03/10/24  Blood Cultures x 2 sites     Status: None (Preliminary result)   Collection Time: 03/10/24  9:05 AM   Specimen: BLOOD  Result Value Ref Range Status   Specimen Description   Final    BLOOD RIGHT ANTECUBITAL Performed at [facility], [address] [place], [place] [address]    Special Requests   Final    BOTTLES DRAWN AEROBIC AND ANAEROBIC Blood Culture adequate volume Performed at [facility], [address], [place], [place] [address]    Culture   Final    NO GROWTH < 24 HOURS Performed at [facility], [address], [place], [place] [address]    Report Status PENDING  Incomplete  Blood Cultures x 2 sites     Status: None (Preliminary result)   Collection Time: 03/10/24  9:15 AM   Specimen: BLOOD  Result Value Ref Range Status   Specimen Description   Final    BLOOD BLOOD RIGHT HAND Performed at [facility], [address], [place], [place] [address]    Special Requests   Final    BOTTLES DRAWN AEROBIC AND ANAEROBIC Blood Culture adequate volume Performed at [facility], [address], [place], [place] [address]    Culture   Final    NO GROWTH < 24 HOURS Performed at [facility], [address], [place], [place] [address]    Report Status PENDING  Incomplete  MRSA Next Gen  by PCR, Nasal     Status: Abnormal   Collection Time: 03/11/24  5:24 AM   Specimen: Nasal Mucosa; Nasal Swab  Result Value Ref Range Status   MRSA by PCR Next Gen DETECTED (A) NOT DETECTED Final    Comment: RESULT CALLED TO, READ BACK BY AND VERIFIED WITH: CINDY,B  AT 0854 ON 03/11/2024 BY DZT (NOTE) The GeneXpert MRSA Assay (FDA approved for NASAL specimens only), is one component of a comprehensive MRSA colonization surveillance program. It is not intended to diagnose MRSA infection nor to guide or monitor treatment for MRSA infections. Test performance is not FDA approved in patients less than 60 years old. Performed at [facility], [address] [place], [place] [address]  Labs: CBC: Recent Labs  Lab 03/10/24 0921 03/11/24 0318  WBC 16.6* 10.9*  NEUTROABS 10.8*  --   HGB 12.8 11.1*  HCT 37.9 34.1*  MCV 91.3 92.9  PLT 388 327   Basic Metabolic Panel: Recent Labs  Lab 03/10/24 0921 03/11/24 0318  NA 135 137  K 3.6 3.4*  CL 96* 101  CO2 26 25  GLUCOSE 191* 130*  BUN 9 8  CREATININE 0.54 0.51  CALCIUM  10.3 8.6*   Liver Function Tests: Recent Labs  Lab 03/10/24 0921  AST 16  ALT 17  ALKPHOS 84  BILITOT 0.4  PROT 7.9  ALBUMIN 4.5   CBG: Recent Labs  Lab 03/10/24 2058 03/11/24 0622 03/11/24 0918  GLUCAP 255* 145* 130*    Discharge time spent: less than 30 minutes.  Signed: Garnette Pelt, MD [facility] 03/11/2024

## 2024-03-11 NOTE — Progress Notes (Signed)
 Walking patient now

## 2024-03-11 NOTE — Consult Note (Signed)
 PODIATRY CONSULTATION  NAME Katie Hawkins MRN [id number] DOB 04-20-1951 DOA 03/10/2024   Reason for consult:  Chief Complaint  Patient presents with   Foot Wound    Attending/Consulting physician: ALONSO Blanch MD  History of present illness: Katie Hawkins is a 73 y.o. female with medical history significant for PVD s/p left femoral to peroneal bypass graft (10/2021), T2DM, HTN, HLD who presented to the ED for evaluation of a wound to the bottom of her left foot.   Patient reports seeing a wound at the base of her left first toe plantar surface about 2 weeks ago.  She says it first appeared as a crack that she had been attempting to treat with aloe vera and soaking the foot.  Wound has worsened during this time with black appearing skin.  She denies any discharge from the wound.  She has seen new erythema involving her left first toe and the top distal half of her left foot.  Discussed with patient regarding awaiting MRI read, she states she was soaking the wound and applying aloe vera. Thought it is less swollen this am from prior. Does have some pain with the area. Does report some neuropathy.   Past Medical History:  Diagnosis Date   Diabetes mellitus    History of blood transfusion    HTN (hypertension), benign 02/13/2012   Hypercholesterolemia    Hyperlipidemia    Hypertension    nodular lymphocyte predominate hodgkins lymphoma dx'd 10/2010   xrt comp 02/20/11   Peripheral vascular disease        Latest Ref Rng & Units 03/11/2024    3:18 AM 03/10/2024    9:21 AM 06/10/2023    3:44 AM  CBC  WBC 4.0 - 10.5 K/uL 10.9  16.6  10.2   Hemoglobin 12.0 - 15.0 g/dL 88.8  87.1  88.6   Hematocrit 36.0 - 46.0 % 34.1  37.9  35.0   Platelets 150 - 400 K/uL 327  388  240        Latest Ref Rng & Units 03/11/2024    3:18 AM 03/10/2024    9:21 AM 06/10/2023    3:44 AM  BMP  Glucose 70 - 99 mg/dL 869  808  814   BUN 8 - 23 mg/dL 8  9  12    Creatinine 0.44 - 1.00 mg/dL 9.48   9.45  9.37   Sodium 135 - 145 mmol/L 137  135  138   Potassium 3.5 - 5.1 mmol/L 3.4  3.6  3.8   Chloride 98 - 111 mmol/L 101  96  104   CO2 22 - 32 mmol/L 25  26  23    Calcium  8.9 - 10.3 mg/dL 8.6  89.6  9.0       Physical Exam: Lower Extremity Exam  Non palpable DP and PT pulse left foot  Necrotic ulceration with surrounding hyperkeratotic tissue unable to assess depth. Mild malodor. Erythema and edema of the left great toe  Sensation diminished to light touch  Prior L third toe amputation    ASSESSMENT/PLAN OF CARE 73 y.o. female with PMHx significant for   PVD s/p left femoral to peroneal bypass graft (10/2021), T2DM, HTN, HLD   with necrotic ulceration plantar great toe sulcus and cellulitis of the left foot.  WBC 10.9 A1c  8.6 XR L foot: no evidence OM MRI L foot: pending read  - NPO p MN in even successful revascularization today with vascualr and MRI indicates osteomyelitis/abscess. She  may need debridement regardless over the weekend given mild malodor and erythema. Will discuss with Dr. Janit.  - Appreciate vascular surgery recs, plan for Angiogram LLE today - Continue IV abx broad spectrum pending further culture data - Anticoagulation: ok to continue per primary/ vascular revcs - Wound care: Betadine paint to the wound daily - WB status: WBAT in post op shoe - Will continue to follow   Thank you for the consult.  Please contact me directly with any questions or concerns.           Katie Hawkins, DPM [facility]    [address] [place], [place] [address]                Office [phone]  Fax [phone]

## 2024-03-11 NOTE — Op Note (Signed)
 DATE OF SERVICE: 03/11/2024  PATIENT:  Katie Hawkins  73 y.o. female  PRE-OPERATIVE DIAGNOSIS:  Atherosclerosis of native arteries of left lower extremity causing ulceration  POST-OPERATIVE DIAGNOSIS:  Same  PROCEDURE:   1) Ultrasound guided right common femoral artery access (CPT [phone]) 2) Aortogram (CPT [phone]) 3) Left lower extremity angiogram with second order cannulation (CPT 36246) 4) Conscious sedation (18 minutes) (CPT 99152)  SURGEON:  Debby SAILOR. Magda, MD  ASSISTANT: none  ANESTHESIA:   local and IV sedation  ESTIMATED BLOOD LOSS: min  LOCAL MEDICATIONS USED:  NONE  COUNTS: confirmed correct.  PATIENT DISPOSITION:  PACU - hemodynamically stable.   Delay start of Pharmacological VTE agent (>24hrs) due to surgical blood loss or risk of bleeding: no  INDICATION FOR PROCEDURE: Katie Hawkins is a 73 y.o. female with left foot ischemic ulceration in setting of known PAD and thrombosed left fem-peroneal bypass. After careful discussion of risks, benefits, and alternatives the patient was offered angiogram. The patient understood and wished to proceed.  OPERATIVE FINDINGS:   Left renal artery: patent Right renal artery: patent  Infrarenal aorta: patent  Left common iliac artery: patent Right common iliac artery: patent  Left internal iliac artery: patent Right internal iliac artery: patent  Left external iliac artery: patent Right external iliac artery: patent  Left common femoral artery: patent Right common femoral artery: not studied  Left profunda femoris artery: patent Right profunda femoris artery: not studied  Left superficial femoral artery: patent, but diseased Right superficial femoral artery: not studied  Left popliteal artery: occluded Right popliteal artery: not studied  Left anterior tibial artery: occluded Right anterior tibial artery: not studied  Left tibioperoneal trunk: patent Right tibioperoneal trunk: not studied  Left peroneal artery: patent  Right peroneal artery: not studied  Left posterior tibial artery: occluded Right posterior tibial artery: not studied  Left pedal circulation: fills via peroneal collaterals Right pedal circulation: not studied   GLASS score. FP 4. IP 0. Stage III  WIfI score. 1 / 3 / 1. Stage III.   DESCRIPTION OF PROCEDURE: After identification of the patient in the pre-operative holding area, the patient was transferred to the operating room. The patient was positioned supine on the operating room table. Anesthesia was induced. The groins was prepped and draped in standard fashion. A surgical pause was performed confirming correct patient, procedure, and operative location.  The right groin was anesthetized with subcutaneous injection of 1% lidocaine . Using ultrasound guidance, the right common femoral artery was accessed with micropuncture technique.  Fluoroscopy was used to confirm cannulation over the femoral head. The 82F micropuncture sheath was upsized to 16F.   A Benson wire was advanced into the distal aorta. Over the wire an omni flush catheter was advanced to the level of L2. Aortogram was performed - see above for details.   The left common iliac artery was selected with an omniflush catheter and bentson guidewire. The wire was advanced into the common femoral artery. Over the wire the omni flush catheter was advanced into the external iliac artery. Selective angiography was performed - see above for details.   The sheath was left in place to be removed in the recovery area.  Conscious sedation was administered with the use of IV fentanyl  and midazolam  under continuous physician and nurse monitoring.  Heart rate, blood pressure, and oxygen saturation were continuously monitored.  Total sedation time was 18 minutes  Upon completion of the case instrument and sharps counts were confirmed correct. The  patient was transferred to the PACU in good condition. I was present for all portions of the  procedure.  PLAN: ASA / Statin. Needs L femoral - peroneal bypass. This will likely be with PTFE. Can be done as an outpatient.   Debby SAILOR. Magda, MD [facility] Phone Number: [phone] 03/11/2024 8:52 AM

## 2024-03-11 NOTE — Progress Notes (Signed)
 Initial Nutrition Assessment  DOCUMENTATION CODES:   Not applicable  INTERVENTION:  -1 packet Juven BID, each packet provides 95 calories, 2.5 grams of protein (collagen), and 9.8 grams of carbohydrate (3 grams sugar); also contains 7 grams of L-arginine and L-glutamine, 300 mg vitamin C, 15 mg vitamin E, 1.2 mcg vitamin B-12, 9.5 mg zinc, 200 mg calcium , and 1.5 g  Calcium  Beta-hydroxy-Beta-methylbutyrate to support wound healing  Reviewed with patient importance of adequate protein for wound healing.  NUTRITION DIAGNOSIS:   Increased nutrient needs related to wound healing as evidenced by  (need for increased protein, wound-healing supplementation).  GOAL:   Other (Comment) (wound healing)   MONITOR:   Supplement acceptance, Skin  REASON FOR ASSESSMENT:   Other (Comment) (wound healing)    ASSESSMENT:   PMH significant for PVD s/p L fem-peroneal bypass graft 10/2021, DM2, HTN, and dyslipidemia. Pt presented with L first toe wound first noticed 2 weeks ago with new erythema distal top half of foot. She was found to have PVD with acute on chronic critical limb ischemia of LLE with superimposed cellulitis.  Visited with the patient and her family member at bedside. The patient states she is eating well and has a good appetite. She had been eating well except for the one day prior to arrival. She watches what she eats and restricts her intake of sugars, fried foods, and fatty foods. Her UBW is 156 lbs and it has been stable. She c/o a stiff toe where her prior toe amputation was but states it does not impede her ambulation or mobility. She is amenable to Juven BID for wound-healing and noted the name as we discussed where she might obtain this outside the hospital.  Procedures: 03/11/24 angiogram with conscious sedation.  Meal Intake: one meal noted today at 100%  Pertinent Medications: Novolog   Labs:  Glucose 130, Potassium 3.4, lipid profile WNL   I/O: +3.5 L since  admit   NUTRITION - FOCUSED PHYSICAL EXAM:  Flowsheet Row Most Recent Value  Orbital Region No depletion  Upper Arm Region No depletion  Thoracic and Lumbar Region No depletion  Buccal Region No depletion  Temple Region Mild depletion  Clavicle Bone Region No depletion  Clavicle and Acromion Bone Region No depletion  Scapular Bone Region No depletion  Dorsal Hand No depletion  Patellar Region No depletion  Anterior Thigh Region No depletion  Posterior Calf Region Mild depletion  Edema (RD Assessment) Mild  [LLE]  Hair Reviewed  Eyes Reviewed  Mouth Reviewed  Skin Reviewed  Nails Reviewed    Diet Order:   Diet Order             Diet regular Room service appropriate? Yes; Fluid consistency: Thin  Diet effective now                   EDUCATION NEEDS:   No education needs have been identified at this time  Skin:  Skin Assessment: Skin Integrity Issues: Skin Integrity Issues:: Stage I, Other (Comment) Stage I: coccyx Other: toe  Last BM:  10/15  Height:   Ht Readings from Last 1 Encounters:  03/10/24 5' 3 (1.6 m)    Weight:   Wt Readings from Last 1 Encounters:  03/10/24 71.5 kg    BMI:  Body mass index is 27.92 kg/m.  Estimated Nutritional Needs:   Kcal:  1600-1800  Protein:  85-95  Fluid:  1600-1800    Leverne Ruth, MS, RDN, LDN [place]. [facility] See  AMION for contact information

## 2024-03-11 NOTE — Progress Notes (Signed)
 Site area: Right femoral  Site Prior to Removal:  Level 0 Pressure Applied For:15 minutes Manual:   Yes Patient Status During Pull:  stable Post Pull Site:  Level 0 Post Pull Instructions Given:  yes Post Pull Pulses Present: Right femoral and Right DP Dressing Applied:  gauze and tegaderm Bedrest begins @ 0900 Comments:

## 2024-03-11 NOTE — Progress Notes (Signed)
 State 1 noted to coccyx. Foam dressing applied.

## 2024-03-11 NOTE — Progress Notes (Signed)
 DISCHARGE NOTE HOME AYIANA WINSLETT to be discharged Home per MD order. Discussed prescriptions and follow up appointments with the patient. Prescriptions given to patient; medication list explained in detail. Patient verbalized understanding.  Skin clean, dry and intact without evidence of skin break down, no evidence of skin tears noted. IV catheter discontinued intact. Site without signs and symptoms of complications. Dressing and pressure applied. Pt denies pain at the site currently. No complaints noted.  See LDA  for sheath and wounds and incisions atr discharge   An After Visit Summary (AVS) was printed and given to the patient. Patient escorted via wheelchair, and discharged home via private auto.  Peyton SHAUNNA Pepper, RN

## 2024-03-11 NOTE — Plan of Care (Signed)
   Problem: Health Behavior/Discharge Planning: Goal: Ability to manage health-related needs will improve Outcome: Progressing   Problem: Clinical Measurements: Goal: Ability to maintain clinical measurements within normal limits will improve Outcome: Progressing Goal: Will remain free from infection Outcome: Progressing

## 2024-03-11 NOTE — Consult Note (Signed)
 [facility] [place]  ASSESSMENT / PLAN: Katie Hawkins is a 73 y.o. female with atherosclerosis of native arteries of left lower extremity causing ulceration.  Recommend:  Abstinence from all tobacco products. Blood glucose control with goal A1c < 7%. Blood pressure control with goal blood pressure < 130/80 mmHg. Lipid reduction therapy with goal LDL-C < 55 mg/dL. Aspirin  81mg  by mouth daily. Atorvastatin 40-80mg  PO QD (or other high intensity statin therapy).  Plan left lower extremity angiogram with possible intervention via right common femoral approach in cath lab today.   CHIEF COMPLAINT: ischemic ulcer left foot  HISTORY OF PRESENT ILLNESS: Katie Hawkins is a 73 y.o. female admitted to the hospital for evaluation of left foot ischemic ulceration.  The patient is well-known to our service, having undergone a left femoral peroneal bypass for gangrene October 30, 2021.  This subsequently underwent endovascular intervention in May 2024 and again in January 2025.  The bypass suffered a silent occlusion between January, and her most recent evaluation in June 2025.  During the June 2025 evaluation, the patient had no symptoms of chronic limb threatening ischemia and so expectant management was recommended.  The patient presented to the hospital yesterday morning for evaluation of ischemic wounds of the left foot which are painful.  I saw her earlier this morning and made a plan with her to do an angiogram to plan a revascularization strategy.  Past Medical History:  Diagnosis Date   Diabetes mellitus    History of blood transfusion    HTN (hypertension), benign 02/13/2012   Hypercholesterolemia    Hyperlipidemia    Hypertension    nodular lymphocyte predominate hodgkins lymphoma dx'd 10/2010   xrt comp 02/20/11   Peripheral vascular disease     Past Surgical History:  Procedure Laterality Date   ABDOMINAL AORTOGRAM W/LOWER EXTREMITY N/A 10/18/2021    Procedure: ABDOMINAL AORTOGRAM W/LOWER EXTREMITY;  Surgeon: Magda Debby SAILOR, MD;  Location: [facility];  Service: Cardiovascular;  Laterality: N/A;   ABDOMINAL AORTOGRAM W/LOWER EXTREMITY N/A 09/30/2022   Procedure: ABDOMINAL AORTOGRAM W/LOWER EXTREMITY;  Surgeon: Serene Gaile ORN, MD;  Location: [facility];  Service: Cardiovascular;  Laterality: N/A;   ABDOMINAL AORTOGRAM W/LOWER EXTREMITY N/A 06/09/2023   Procedure: ABDOMINAL AORTOGRAM W/LOWER EXTREMITY;  Surgeon: Serene Gaile ORN, MD;  Location: [facility];  Service: Cardiovascular;  Laterality: N/A;   AMPUTATION TOE Left 11/01/2021   Procedure: AMPUTATION TOE;  Surgeon: Silva Juliene SAUNDERS, DPM;  Location: [facility] OR;  Service: Podiatry;  Laterality: Left;  will do own block   APPLICATION OF WOUND VAC Left 10/30/2021   Procedure: APPLICATION OF WOUND VAC;  Surgeon: Serene Gaile ORN, MD;  Location: [facility] OR;  Service: Vascular;  Laterality: Left;   BYPASS GRAFT POPLITEAL TO TIBIAL Left 10/30/2021   Procedure: LEFT FEMORAL TO PERONEAL BYPASS GRAFT USING VEIN;  Surgeon: Serene Gaile ORN, MD;  Location: [facility] OR;  Service: Vascular;  Laterality: Left;   CESAREAN SECTION     2   DILATION AND CURETTAGE OF UTERUS     PERIPHERAL VASCULAR ATHERECTOMY  06/09/2023   Procedure: PERIPHERAL VASCULAR ATHERECTOMY;  Surgeon: Serene Gaile ORN, MD;  Location: [facility];  Service: Cardiovascular;;   PERIPHERAL VASCULAR BALLOON ANGIOPLASTY  09/30/2022   Procedure: PERIPHERAL VASCULAR BALLOON ANGIOPLASTY;  Surgeon: Serene Gaile ORN, MD;  Location: [facility];  Service: Cardiovascular;;   PERIPHERAL VASCULAR BALLOON ANGIOPLASTY Left 06/10/2023   Procedure: PERIPHERAL VASCULAR  BALLOON ANGIOPLASTY;  Surgeon: Lanis Fonda BRAVO, MD;  Location: [facility] INVAS[facility] CV LAB;  Service: Cardiovascular;  Laterality: Left;   PERIPHERAL VASCULAR THROMBECTOMY  06/09/2023   Procedure: PERIPHERAL VASCULAR THROMBECTOMY;  Surgeon: Serene Gaile ORN, MD;  [facility]: [facility];  Service: Cardiovascular;;   PERIPHERAL VASCULAR THROMBECTOMY Left 06/10/2023   Procedure: LYSIS RECHECK;  Surgeon: Lanis Fonda BRAVO, MD;  Location: [facility] INVASIVE CV LAB;  Service: Cardiovascular;  Laterality: Left;   VEIN HARVEST Left 10/30/2021   Procedure: VEIN HARVEST;  Surgeon: Serene Gaile ORN, MD;  Location: [facility] OR;  Service: Vascular;  Laterality: Left;    Family History  Problem Relation Age of Onset   Diabetes Father    Cancer Maternal Grandfather        lung   Cancer Paternal Grandfather        leukemia    Social History   Socioeconomic History   Marital status: Married    Spouse name: Not on file   Number of children: Not on file   Years of education: Not on file   Highest education level: Not on file  Occupational History   Not on file  Tobacco Use   Smoking status: Never   Smokeless tobacco: Never  Vaping Use   Vaping status: Never Used  Substance and Sexual Activity   Alcohol use: Yes    Alcohol/week: 0.0 standard drinks of alcohol    Comment: occasionally   Drug use: No   Sexual activity: Not on file  Other Topics Concern   Not on file  Social History Narrative   Not on file   Social Drivers of Health   Financial Resource Strain: Not on file  Food Insecurity: No Food Insecurity (03/10/2024)   Hunger Vital Sign    Worried About Running Out of Food in the Last Year: Never true    Ran Out of Food in the Last Year: Never true  Transportation Needs: No Transportation Needs (03/10/2024)   PRAPARE - Administrator, Civil Service (Medical): No    Lack of Transportation (Non-Medical): No  Physical Activity: Not on file  Stress: Not on file  Social Connections: Socially Integrated (03/10/2024)   Social Connection and Isolation Panel    Frequency of Communication with Friends and Family: Three times a week    Frequency of Social Gatherings with Friends and Family: More than three times a week    Attends Religious Services: More than 4 times  per year    Active Member of [employer] or Organizations: Yes    Attends Engineer, structural: More than 4 times per year    Marital Status: Married  Catering manager Violence: Not At Risk (03/10/2024)   Humiliation, Afraid, Rape, and Kick questionnaire    Fear of Current or Ex-Partner: No    Emotionally Abused: No    Physically Abused: No    Sexually Abused: No    Allergies  Allergen Reactions   Linagliptin     didn't like how she felt on this    Current Facility-Administered Medications  Medication Dose Route Frequency Provider Last Rate Last Admin   0.9 %  sodium chloride  infusion   Intravenous Continuous Kehrli, Kelsey F, PA-C   Paused at 03/11/24 0526   0.9 %  sodium chloride  infusion   Intravenous Continuous Magda Debby SAILOR, MD 100 mL/hr at 03/11/24 0527 New Bag at 03/11/24 0527   [MAR Hold] acetaminophen  (TYLENOL ) tablet 650 mg  650 mg Oral Q6H  PRN Patel, Vishal R, MD   650 mg at 03/11/24 0100   Or   [MAR Hold] acetaminophen  (TYLENOL ) suppository 650 mg  650 mg Rectal Q6H PRN Patel, Vishal R, MD       [MAR Hold] Ampicillin-Sulbactam (UNASYN) 3 g in sodium chloride  0.9 % 100 mL IVPB  3 g Intravenous Q6H Chen, Lydia D, RPH 200 mL/hr at 03/11/24 0300 3 g at 03/11/24 0300   [MAR Hold] aspirin  EC tablet 81 mg  81 mg Oral Daily Patel, Vishal R, MD   81 mg at 03/10/24 2055   [MAR Hold] bisacodyl  (DULCOLAX) EC tablet 5 mg  5 mg Oral Daily PRN Patel, Vishal R, MD       [MAR Hold] clopidogrel  (PLAVIX ) tablet 75 mg  75 mg Oral Daily Patel, Vishal R, MD       [MAR Hold] ezetimibe  (ZETIA ) tablet 10 mg  10 mg Oral Daily Patel, Vishal R, MD       fentaNYL  (SUBLIMAZE ) injection    PRN Gianpaolo Mindel N, MD   25 mcg at 03/11/24 0759   Heparin  (Porcine) in NaCl 1000-0.9 UT/500ML-% SOLN    PRN Magda Debby SAILOR, MD   500 mL at 03/11/24 0819   [MAR Hold] HYDROcodone -acetaminophen  (NORCO/VICODIN) 5-325 MG per tablet 1-2 tablet  1-2 tablet Oral Q4H PRN Patel, Vishal R, MD   2 tablet at 03/11/24  0059   [MAR Hold] insulin  aspart (novoLOG ) injection 0-5 Units  0-5 Units Subcutaneous QHS Patel, Vishal R, MD   3 Units at 03/10/24 2107   [facility] Hold] insulin  aspart (novoLOG ) injection 0-9 Units  0-9 Units Subcutaneous TID WC Patel, Vishal R, MD   1 Units at 03/11/24 0631   iodixanol  (VISIPAQUE ) 320 MG/ML injection    PRN Magda Debby SAILOR, MD   45 mL at 03/11/24 0820   lidocaine  (PF) (XYLOCAINE ) 1 % injection    PRN Aidel Davisson N, MD   15 mL at 03/11/24 0800   [MAR Hold] lisinopril  (ZESTRIL ) tablet 10 mg  10 mg Oral Daily Patel, Vishal R, MD       [MAR Hold] metoprolol  succinate (TOPROL -XL) 24 hr tablet 25 mg  25 mg Oral Daily Patel, Vishal R, MD   25 mg at 03/10/24 2055   midazolam  PF (VERSED ) injection    PRN Jon Kasparek N, MD   1 mg at 03/11/24 0759   [MAR Hold] ondansetron  (ZOFRAN ) tablet 4 mg  4 mg Oral Q6H PRN Patel, Vishal R, MD       Or   [facility] Hold] ondansetron  (ZOFRAN ) injection 4 mg  4 mg Intravenous Q6H PRN Patel, Vishal R, MD       [MAR Hold] pravastatin  (PRAVACHOL ) tablet 40 mg  40 mg Oral Daily Patel, Vishal R, MD       [MAR Hold] senna-docusate (Senokot-S) tablet 1 tablet  1 tablet Oral QHS PRN Patel, Vishal R, MD       [MAR Hold] sodium chloride  flush (NS) 0.9 % injection 3 mL  3 mL Intravenous Q12H Patel, Vishal R, MD   3 mL at 03/10/24 2101   [MAR Hold] vancomycin (VANCOREADY) IVPB 1250 mg/250 mL  1,250 mg Intravenous Q24H Laurence Jinnie BIRCH, RPH 166.7 mL/hr at 03/11/24 0528 1,250 mg at 03/11/24 0528    PHYSICAL EXAM Vitals:   03/11/24 0328 03/11/24 0745 03/11/24 0830 03/11/24 0840  BP: 109/62  114/70   Pulse: 91  95 93  Resp: 20  13 13   Temp: 98.1 F (36.7  C)     TempSrc: Oral     SpO2: 97% 94% 93% 96%  Weight:      Height:       Chronically ill elderly woman in no distress Regular rate and rhythm Unlabored breathing 2+ femoral pulses No palpable pedal pulses Ischemic wounds of the plantar aspect of the great toe and third toe  PERTINENT LABORATORY AND  RADIOLOGIC DATA  Most recent CBC    Latest Ref Rng & Units 03/11/2024    3:18 AM 03/10/2024    9:21 AM 06/10/2023    3:44 AM  CBC  WBC 4.0 - 10.5 K/uL 10.9  16.6  10.2   Hemoglobin 12.0 - 15.0 g/dL 88.8  87.1  88.6   Hematocrit 36.0 - 46.0 % 34.1  37.9  35.0   Platelets 150 - 400 K/uL 327  388  240      Most recent CMP    Latest Ref Rng & Units 03/11/2024    3:18 AM 03/10/2024    9:21 AM 06/10/2023    3:44 AM  CMP  Glucose 70 - 99 mg/dL 869  808  814   BUN 8 - 23 mg/dL 8  9  12    Creatinine 0.44 - 1.00 mg/dL 9.48  9.45  9.37   Sodium 135 - 145 mmol/L 137  135  138   Potassium 3.5 - 5.1 mmol/L 3.4  3.6  3.8   Chloride 98 - 111 mmol/L 101  96  104   CO2 22 - 32 mmol/L 25  26  23    Calcium  8.9 - 10.3 mg/dL 8.6  89.6  9.0   Total Protein 6.5 - 8.1 g/dL  7.9    Total Bilirubin 0.0 - 1.2 mg/dL  0.4    Alkaline Phos 38 - 126 U/L  84    AST 15 - 41 U/L  16    ALT 0 - 44 U/L  17      Renal function Estimated Creatinine Clearance: 58.4 mL/min (by C-G formula based on SCr of 0.51 mg/dL).  Hgb A1c MFr Bld (%)  Date Value  03/11/2024 8.6 (H)    LDL Cholesterol  Date Value Ref Range Status  10/31/2021 57 0 - 99 mg/dL Final    Comment:           Total Cholesterol/HDL:CHD Risk Coronary Heart Disease Risk Table                     Men   Women  1/2 Average Risk   3.4   3.3  Average Risk       5.0   4.4  2 X Average Risk   9.6   7.1  3 X Average Risk  23.4   11.0        Use the calculated Patient Ratio above and the CHD Risk Table to determine the patient's CHD Risk.        ATP III CLASSIFICATION (LDL):  <100     mg/dL   Optimal  899-870  mg/dL   Near or Above                    Optimal  130-159  mg/dL   Borderline  839-810  mg/dL   High  >809     mg/dL   Very High Performed at [facility], [address] [place], [place] [address]     Debby SAILOR. Magda, MD FACS [facility] [employer] Number: [phone] 03/11/2024 8:46  AM   Total time spent on preparing this encounter including chart review, data review, collecting history, examining the patient, and coordinating care: 60 min  Portions of this report may have been transcribed using voice recognition software.  Every effort has been made to ensure accuracy; however, inadvertent computerized transcription errors may still be present.

## 2024-03-13 ENCOUNTER — Encounter (HOSPITAL_COMMUNITY): Payer: Self-pay | Admitting: Vascular Surgery

## 2024-03-14 ENCOUNTER — Telehealth: Payer: Self-pay

## 2024-03-14 ENCOUNTER — Encounter (HOSPITAL_COMMUNITY): Payer: Self-pay | Admitting: Vascular Surgery

## 2024-03-14 ENCOUNTER — Encounter (HOSPITAL_COMMUNITY): Payer: Self-pay | Admitting: Certified Registered"

## 2024-03-14 ENCOUNTER — Other Ambulatory Visit: Payer: Self-pay

## 2024-03-14 ENCOUNTER — Inpatient Hospital Stay: Admit: 2024-03-14 | Admitting: Vascular Surgery

## 2024-03-14 ENCOUNTER — Encounter (HOSPITAL_COMMUNITY): Payer: Self-pay

## 2024-03-14 DIAGNOSIS — I70262 Atherosclerosis of native arteries of extremities with gangrene, left leg: Secondary | ICD-10-CM

## 2024-03-14 NOTE — Telephone Encounter (Signed)
 Patient informed this nurse that she would like to wait to speak to Dr. Serene next week prior to scheduling surgery for bypass.  Admission and surgery canceled for 10/20 - 10/21.

## 2024-03-14 NOTE — Progress Notes (Signed)
 SDW CALL  Patient was given pre-op  instructions over the phone. The opportunity was given for the patient to ask questions. No further questions asked. Patient verbalized understanding of instructions given.   PCP - Candace Smith      LOV-March 2025 Cardiologist - denies  PPM/ICD - denies Device Orders - n/a Rep Notified -  n/a  Chest x-ray - denies EKG - 06/09/23 Stress Test - denies ECHO - denies Cardiac Cath - denies  Sleep Study - denies CPAP - n/a  Fasting Blood Sugar - 130-150 Checks Blood Sugar __2___ times a week  Patient instructed to check blood sugar on day of surgery and instructed that if blood sugar is less than 70, she needs to drink 1/2 cup of apple or cranberry juice.    Last dose of GLP1 agonist-  n/a GLP1 instructions:  n/a  Blood Thinner Instructions:  last dose of Plavix  was 10/18 Aspirin  Instructions: per office, patient is to continue Aspirin   ERAS Protcol - NPO PRE-SURGERY Ensure or G2-  n/a  COVID TEST- n/a   Anesthesia review: no  Patient denies shortness of breath, fever, cough and chest pain over the phone call   All instructions explained to the patient, with a verbal understanding of the material. Patient agrees to go over the instructions while at home for a better understanding.

## 2024-03-15 ENCOUNTER — Inpatient Hospital Stay (HOSPITAL_COMMUNITY): Admission: RE | Admit: 2024-03-15 | Source: Home / Self Care | Admitting: Vascular Surgery

## 2024-03-15 LAB — CULTURE, BLOOD (ROUTINE X 2)
Culture: NO GROWTH
Culture: NO GROWTH
Special Requests: ADEQUATE
Special Requests: ADEQUATE

## 2024-03-15 SURGERY — CREATION, BYPASS, ARTERIAL, FEMORAL TO PERONEAL, USING GRAFT
Anesthesia: General | Laterality: Left

## 2024-03-17 ENCOUNTER — Other Ambulatory Visit: Payer: Self-pay | Admitting: Vascular Surgery

## 2024-03-17 MED ORDER — HYDROCODONE-ACETAMINOPHEN 5-325 MG PO TABS: 1.0000 | ORAL_TABLET | Freq: Four times a day (QID) | ORAL | 0 refills | Status: DC | PRN

## 2024-03-21 ENCOUNTER — Encounter: Payer: Self-pay | Admitting: Physician Assistant

## 2024-03-21 ENCOUNTER — Other Ambulatory Visit: Payer: Self-pay

## 2024-03-21 ENCOUNTER — Encounter (HOSPITAL_COMMUNITY): Payer: Self-pay

## 2024-03-21 ENCOUNTER — Ambulatory Visit (HOSPITAL_BASED_OUTPATIENT_CLINIC_OR_DEPARTMENT_OTHER)
Admission: RE | Admit: 2024-03-21 | Discharge: 2024-03-21 | Disposition: A | Discharge status: Home / Self Care | Source: Ambulatory Visit | Attending: Physician Assistant | Admitting: Physician Assistant

## 2024-03-21 ENCOUNTER — Ambulatory Visit: Admitting: Physician Assistant

## 2024-03-21 ENCOUNTER — Inpatient Hospital Stay (HOSPITAL_COMMUNITY)
Admission: RE | Admit: 2024-03-21 | Discharge: 2024-03-28 | DRG: 240 | Disposition: A | Discharge status: Home / Self Care | Attending: Surgery | Admitting: Surgery

## 2024-03-21 VITALS — BP 123/75 | HR 104 | Temp 98.0°F | Wt 155.4 lb

## 2024-03-21 DIAGNOSIS — E669 Obesity, unspecified: Secondary | ICD-10-CM | POA: Diagnosis present

## 2024-03-21 DIAGNOSIS — Y832 Surgical operation with anastomosis, bypass or graft as the cause of abnormal reaction of the patient, or of later complication, without mention of misadventure at the time of the procedure: Secondary | ICD-10-CM | POA: Diagnosis present

## 2024-03-21 DIAGNOSIS — Z89422 Acquired absence of other left toe(s): Secondary | ICD-10-CM | POA: Diagnosis not present

## 2024-03-21 DIAGNOSIS — T82898A Other specified complication of vascular prosthetic devices, implants and grafts, initial encounter: Secondary | ICD-10-CM

## 2024-03-21 DIAGNOSIS — I70245 Atherosclerosis of native arteries of left leg with ulceration of other part of foot: Secondary | ICD-10-CM | POA: Insufficient documentation

## 2024-03-21 DIAGNOSIS — E1152 Type 2 diabetes mellitus with diabetic peripheral angiopathy with gangrene: Secondary | ICD-10-CM | POA: Diagnosis not present

## 2024-03-21 DIAGNOSIS — I739 Peripheral vascular disease, unspecified: Secondary | ICD-10-CM | POA: Insufficient documentation

## 2024-03-21 DIAGNOSIS — M868X8 Other osteomyelitis, other site: Secondary | ICD-10-CM | POA: Diagnosis not present

## 2024-03-21 DIAGNOSIS — J9811 Atelectasis: Secondary | ICD-10-CM | POA: Diagnosis not present

## 2024-03-21 DIAGNOSIS — I70229 Atherosclerosis of native arteries of extremities with rest pain, unspecified extremity: Principal | ICD-10-CM | POA: Diagnosis present

## 2024-03-21 DIAGNOSIS — I70262 Atherosclerosis of native arteries of extremities with gangrene, left leg: Secondary | ICD-10-CM | POA: Insufficient documentation

## 2024-03-21 DIAGNOSIS — R2681 Unsteadiness on feet: Secondary | ICD-10-CM | POA: Diagnosis not present

## 2024-03-21 DIAGNOSIS — L03032 Cellulitis of left toe: Secondary | ICD-10-CM | POA: Diagnosis not present

## 2024-03-21 DIAGNOSIS — I96 Gangrene, not elsewhere classified: Secondary | ICD-10-CM | POA: Diagnosis not present

## 2024-03-21 DIAGNOSIS — E7800 Pure hypercholesterolemia, unspecified: Secondary | ICD-10-CM | POA: Diagnosis present

## 2024-03-21 DIAGNOSIS — M869 Osteomyelitis, unspecified: Secondary | ICD-10-CM | POA: Diagnosis not present

## 2024-03-21 DIAGNOSIS — E118 Type 2 diabetes mellitus with unspecified complications: Secondary | ICD-10-CM | POA: Diagnosis present

## 2024-03-21 DIAGNOSIS — Z7902 Long term (current) use of antithrombotics/antiplatelets: Secondary | ICD-10-CM

## 2024-03-21 DIAGNOSIS — I152 Hypertension secondary to endocrine disorders: Secondary | ICD-10-CM | POA: Diagnosis not present

## 2024-03-21 DIAGNOSIS — R000 Tachycardia, unspecified: Secondary | ICD-10-CM | POA: Diagnosis not present

## 2024-03-21 DIAGNOSIS — J90 Pleural effusion, not elsewhere classified: Secondary | ICD-10-CM | POA: Diagnosis not present

## 2024-03-21 DIAGNOSIS — M86172 Other acute osteomyelitis, left ankle and foot: Secondary | ICD-10-CM | POA: Diagnosis not present

## 2024-03-21 DIAGNOSIS — K219 Gastro-esophageal reflux disease without esophagitis: Secondary | ICD-10-CM | POA: Diagnosis present

## 2024-03-21 DIAGNOSIS — I10 Essential (primary) hypertension: Secondary | ICD-10-CM | POA: Diagnosis not present

## 2024-03-21 DIAGNOSIS — Z79899 Other long term (current) drug therapy: Secondary | ICD-10-CM | POA: Diagnosis not present

## 2024-03-21 DIAGNOSIS — E1159 Type 2 diabetes mellitus with other circulatory complications: Secondary | ICD-10-CM | POA: Diagnosis not present

## 2024-03-21 DIAGNOSIS — Z8571 Personal history of Hodgkin lymphoma: Secondary | ICD-10-CM

## 2024-03-21 DIAGNOSIS — E785 Hyperlipidemia, unspecified: Secondary | ICD-10-CM | POA: Diagnosis not present

## 2024-03-21 DIAGNOSIS — Z7984 Long term (current) use of oral hypoglycemic drugs: Secondary | ICD-10-CM

## 2024-03-21 DIAGNOSIS — E7849 Other hyperlipidemia: Secondary | ICD-10-CM | POA: Diagnosis present

## 2024-03-21 DIAGNOSIS — Z7982 Long term (current) use of aspirin: Secondary | ICD-10-CM

## 2024-03-21 DIAGNOSIS — Z833 Family history of diabetes mellitus: Secondary | ICD-10-CM

## 2024-03-21 DIAGNOSIS — Z6829 Body mass index (BMI) 29.0-29.9, adult: Secondary | ICD-10-CM | POA: Diagnosis not present

## 2024-03-21 DIAGNOSIS — I70249 Atherosclerosis of native arteries of left leg with ulceration of unspecified site: Secondary | ICD-10-CM | POA: Diagnosis not present

## 2024-03-21 DIAGNOSIS — E1169 Type 2 diabetes mellitus with other specified complication: Secondary | ICD-10-CM | POA: Diagnosis not present

## 2024-03-21 DIAGNOSIS — I700 Atherosclerosis of aorta: Secondary | ICD-10-CM | POA: Diagnosis not present

## 2024-03-21 DIAGNOSIS — E11621 Type 2 diabetes mellitus with foot ulcer: Secondary | ICD-10-CM | POA: Diagnosis present

## 2024-03-21 DIAGNOSIS — L97529 Non-pressure chronic ulcer of other part of left foot with unspecified severity: Secondary | ICD-10-CM | POA: Diagnosis not present

## 2024-03-21 DIAGNOSIS — T82858A Stenosis of vascular prosthetic devices, implants and grafts, initial encounter: Secondary | ICD-10-CM | POA: Diagnosis not present

## 2024-03-21 DIAGNOSIS — F05 Delirium due to known physiological condition: Secondary | ICD-10-CM | POA: Diagnosis not present

## 2024-03-21 DIAGNOSIS — I050 Rheumatic mitral stenosis: Secondary | ICD-10-CM

## 2024-03-21 DIAGNOSIS — I70362 Atherosclerosis of unspecified type of bypass graft(s) of the extremities with gangrene, left leg: Secondary | ICD-10-CM | POA: Diagnosis not present

## 2024-03-21 DIAGNOSIS — R400 Somnolence: Secondary | ICD-10-CM | POA: Diagnosis not present

## 2024-03-21 DIAGNOSIS — E119 Type 2 diabetes mellitus without complications: Secondary | ICD-10-CM | POA: Diagnosis not present

## 2024-03-21 DIAGNOSIS — R008 Other abnormalities of heart beat: Secondary | ICD-10-CM | POA: Diagnosis not present

## 2024-03-21 LAB — URINALYSIS, ROUTINE W REFLEX MICROSCOPIC
Bacteria, UA: NONE SEEN
Bilirubin Urine: NEGATIVE
Glucose, UA: >=500 mg/dL — AB
Hgb urine dipstick: NEGATIVE
Ketones, ur: NEGATIVE mg/dL
Nitrite: NEGATIVE
Protein, ur: NEGATIVE mg/dL
Specific Gravity, Urine: 1.013 (ref 1.005–1.030)
pH: 6 (ref 5.0–8.0)

## 2024-03-21 LAB — COMPREHENSIVE METABOLIC PANEL WITH GFR
ALT: 19 U/L (ref 0–44)
AST: 16 U/L (ref 15–41)
Albumin: 2.7 g/dL — ABNORMAL LOW (ref 3.5–5.0)
Alkaline Phosphatase: 56 U/L (ref 38–126)
Anion gap: 11 (ref 5–15)
BUN: 16 mg/dL (ref 8–23)
CO2: 26 mmol/L (ref 22–32)
Calcium: 8.8 mg/dL — ABNORMAL LOW (ref 8.9–10.3)
Chloride: 98 mmol/L (ref 98–111)
Creatinine, Ser: 0.71 mg/dL (ref 0.44–1.00)
GFR, Estimated: >60 mL/min (ref >60)
Glucose, Bld: 269 mg/dL — ABNORMAL HIGH (ref 70–99)
Potassium: 4.5 mmol/L (ref 3.5–5.1)
Sodium: 135 mmol/L (ref 135–145)
Total Bilirubin: 0.4 mg/dL (ref 0.0–1.2)
Total Protein: 6.2 g/dL — ABNORMAL LOW (ref 6.5–8.1)

## 2024-03-21 LAB — CBC
HCT: 32.2 % — ABNORMAL LOW (ref 36.0–46.0)
Hemoglobin: 10.7 g/dL — ABNORMAL LOW (ref 12.0–15.0)
MCH: 30.2 pg (ref 26.0–34.0)
MCHC: 33.2 g/dL (ref 30.0–36.0)
MCV: 91 fL (ref 80.0–100.0)
Platelets: 415 K/uL — ABNORMAL HIGH (ref 150–400)
RBC: 3.54 MIL/uL — ABNORMAL LOW (ref 3.87–5.11)
RDW: 12.7 % (ref 11.5–15.5)
WBC: 11.3 K/uL — ABNORMAL HIGH (ref 4.0–10.5)
nRBC: 0 % (ref 0.0–0.2)

## 2024-03-21 LAB — PROTIME-INR
INR: 1 (ref 0.8–1.2)
Prothrombin Time: 13.7 s (ref 11.4–15.2)

## 2024-03-21 LAB — VAS US ABI WITH/WO TBI
Left ABI: 0.55
Right ABI: 0.82

## 2024-03-21 LAB — GLUCOSE, CAPILLARY: Glucose-Capillary: 247 mg/dL — ABNORMAL HIGH (ref 70–99)

## 2024-03-21 MED ORDER — METOPROLOL TARTRATE 5 MG/5ML IV SOLN
2.0000 mg | INTRAVENOUS | Status: AC | PRN
  Administered 2024-03-26 – 2024-03-27 (×2): 5 mg via INTRAVENOUS
  Filled 2024-03-21 (×2): qty 5

## 2024-03-21 MED ORDER — SODIUM CHLORIDE 0.9 % IV SOLN: 250.0000 mL | INTRAVENOUS | Status: AC | PRN

## 2024-03-21 MED ORDER — POTASSIUM CHLORIDE CRYS ER 20 MEQ PO TBCR: 20.0000 meq | EXTENDED_RELEASE_TABLET | Freq: Once | ORAL | Status: DC

## 2024-03-21 MED ORDER — GUAIFENESIN-DM 100-10 MG/5ML PO SYRP: 15.0000 mL | ORAL_SOLUTION | ORAL | Status: DC | PRN

## 2024-03-21 MED ORDER — HYDROMORPHONE HCL 1 MG/ML IJ SOLN
0.5000 mg | INTRAMUSCULAR | Status: DC | PRN
  Administered 2024-03-26 – 2024-03-28 (×7): 0.5 mg via INTRAVENOUS
  Filled 2024-03-21 (×7): qty 0.5

## 2024-03-21 MED ORDER — ASPIRIN 81 MG PO TBEC
81.0000 mg | DELAYED_RELEASE_TABLET | Freq: Every day | ORAL | Status: DC
  Administered 2024-03-21 – 2024-03-28 (×8): 81 mg via ORAL
  Filled 2024-03-21 (×8): qty 1

## 2024-03-21 MED ORDER — HYDRALAZINE HCL 20 MG/ML IJ SOLN: 5.0000 mg | INTRAMUSCULAR | Status: DC | PRN

## 2024-03-21 MED ORDER — OXYCODONE-ACETAMINOPHEN 5-325 MG PO TABS
1.0000 | ORAL_TABLET | ORAL | Status: DC | PRN
  Administered 2024-03-21 – 2024-03-26 (×12): 2 via ORAL
  Filled 2024-03-21 (×13): qty 2

## 2024-03-21 MED ORDER — EZETIMIBE 10 MG PO TABS
10.0000 mg | ORAL_TABLET | Freq: Every day | ORAL | Status: DC
  Administered 2024-03-22 – 2024-03-28 (×7): 10 mg via ORAL
  Filled 2024-03-21 (×7): qty 1

## 2024-03-21 MED ORDER — LISINOPRIL 10 MG PO TABS
10.0000 mg | ORAL_TABLET | Freq: Every day | ORAL | Status: DC
  Administered 2024-03-22 – 2024-03-28 (×5): 10 mg via ORAL
  Filled 2024-03-21 (×7): qty 1

## 2024-03-21 MED ORDER — VANCOMYCIN HCL IN DEXTROSE 1-5 GM/200ML-% IV SOLN
1000.0000 mg | INTRAVENOUS | Status: DC
Start: 2024-03-21 — End: 2024-03-26
  Administered 2024-03-21 – 2024-03-25 (×5): 1000 mg via INTRAVENOUS
  Filled 2024-03-21 (×5): qty 200

## 2024-03-21 MED ORDER — ACETAMINOPHEN 650 MG RE SUPP: 325.0000 mg | RECTAL | Status: DC | PRN

## 2024-03-21 MED ORDER — SODIUM CHLORIDE 0.9% FLUSH
3.0000 mL | Freq: Two times a day (BID) | INTRAVENOUS | Status: DC
  Administered 2024-03-21 – 2024-03-28 (×13): 3 mL via INTRAVENOUS

## 2024-03-21 MED ORDER — PHENOL 1.4 % MT LIQD: 1.0000 | OROMUCOSAL | Status: DC | PRN

## 2024-03-21 MED ORDER — ACETAMINOPHEN 325 MG PO TABS
325.0000 mg | ORAL_TABLET | ORAL | Status: DC | PRN
  Administered 2024-03-24 (×2): 650 mg via ORAL
  Filled 2024-03-21 (×2): qty 2

## 2024-03-21 MED ORDER — SODIUM CHLORIDE 0.9% FLUSH: 3.0000 mL | INTRAVENOUS | Status: DC | PRN

## 2024-03-21 MED ORDER — ALUM & MAG HYDROXIDE-SIMETH 200-200-20 MG/5ML PO SUSP: 15.0000 mL | ORAL | Status: DC | PRN

## 2024-03-21 MED ORDER — ATORVASTATIN CALCIUM 40 MG PO TABS
40.0000 mg | ORAL_TABLET | Freq: Every day | ORAL | Status: DC
  Administered 2024-03-21 – 2024-03-27 (×7): 40 mg via ORAL
  Filled 2024-03-21 (×7): qty 1

## 2024-03-21 MED ORDER — POLYETHYLENE GLYCOL 3350 17 G PO PACK: 17.0000 g | PACK | Freq: Every day | ORAL | Status: DC | PRN

## 2024-03-21 MED ORDER — INSULIN ASPART 100 UNIT/ML IJ SOLN
0.0000 [IU] | Freq: Every day | INTRAMUSCULAR | Status: DC
  Administered 2024-03-21: 2 [IU] via SUBCUTANEOUS
  Administered 2024-03-22: 4 [IU] via SUBCUTANEOUS
  Administered 2024-03-23 – 2024-03-26 (×2): 2 [IU] via SUBCUTANEOUS

## 2024-03-21 MED ORDER — METOPROLOL SUCCINATE ER 25 MG PO TB24
25.0000 mg | ORAL_TABLET | Freq: Every day | ORAL | Status: DC
  Administered 2024-03-22 – 2024-03-27 (×5): 25 mg via ORAL
  Filled 2024-03-21 (×8): qty 1

## 2024-03-21 MED ORDER — LABETALOL HCL 5 MG/ML IV SOLN: 10.0000 mg | INTRAVENOUS | Status: DC | PRN

## 2024-03-21 MED ORDER — HEPARIN SODIUM (PORCINE) 5000 UNIT/ML IJ SOLN
5000.0000 [IU] | Freq: Three times a day (TID) | INTRAMUSCULAR | Status: DC
  Administered 2024-03-21 – 2024-03-24 (×8): 5000 [IU] via SUBCUTANEOUS
  Filled 2024-03-21 (×9): qty 1

## 2024-03-21 MED ORDER — ONDANSETRON HCL 4 MG/2ML IJ SOLN: 4.0000 mg | Freq: Four times a day (QID) | INTRAMUSCULAR | Status: DC | PRN

## 2024-03-21 MED ORDER — INSULIN ASPART 100 UNIT/ML IJ SOLN
0.0000 [IU] | Freq: Three times a day (TID) | INTRAMUSCULAR | Status: DC
  Administered 2024-03-22: 5 [IU] via SUBCUTANEOUS
  Administered 2024-03-22: 11 [IU] via SUBCUTANEOUS
  Administered 2024-03-23: 5 [IU] via SUBCUTANEOUS
  Administered 2024-03-23: 3 [IU] via SUBCUTANEOUS
  Administered 2024-03-23: 8 [IU] via SUBCUTANEOUS
  Administered 2024-03-24: 3 [IU] via SUBCUTANEOUS
  Administered 2024-03-24: 8 [IU] via SUBCUTANEOUS
  Administered 2024-03-24 – 2024-03-25 (×3): 3 [IU] via SUBCUTANEOUS
  Administered 2024-03-26 (×2): 5 [IU] via SUBCUTANEOUS
  Administered 2024-03-27: 3 [IU] via SUBCUTANEOUS
  Administered 2024-03-27: 5 [IU] via SUBCUTANEOUS
  Administered 2024-03-28: 2 [IU] via SUBCUTANEOUS
  Administered 2024-03-28: 5 [IU] via SUBCUTANEOUS
  Filled 2024-03-21: qty 3
  Filled 2024-03-21: qty 4

## 2024-03-21 MED ORDER — BISACODYL 10 MG RE SUPP
10.0000 mg | Freq: Every day | RECTAL | Status: DC | PRN
Start: 2024-03-21 — End: 2024-03-28

## 2024-03-21 MED ORDER — PIPERACILLIN-TAZOBACTAM 3.375 G IVPB 30 MIN
3.3750 g | Freq: Once | INTRAVENOUS | Status: AC
  Administered 2024-03-21: 3.375 g via INTRAVENOUS
  Filled 2024-03-21: qty 50

## 2024-03-21 MED ORDER — PIPERACILLIN-TAZOBACTAM 3.375 G IVPB
3.3750 g | Freq: Three times a day (TID) | INTRAVENOUS | Status: DC
  Administered 2024-03-21 – 2024-03-26 (×15): 3.375 g via INTRAVENOUS
  Filled 2024-03-21 (×15): qty 50

## 2024-03-21 MED ORDER — PANTOPRAZOLE SODIUM 40 MG PO TBEC
40.0000 mg | DELAYED_RELEASE_TABLET | Freq: Every day | ORAL | Status: DC
  Administered 2024-03-22 – 2024-03-28 (×7): 40 mg via ORAL
  Filled 2024-03-21 (×7): qty 1

## 2024-03-21 NOTE — Progress Notes (Signed)
 Office Note     CC:  follow up Requesting Provider:  Claudene Pellet, MD  HPI: Katie Hawkins is a 73 y.o. 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 She just recently underwent Aortogram on 03/11/24 by Dr. Magda. This showed known occluded LLE bypass, occlusion of native left popliteal, AT and PT arteries. Single vessel peroneal runoff. Continued medical management and discussion of redo bypass were recommended.  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 Serene. This subsequently underwent endovascular intervention in May of 2024 and again in January 2025. The bypass suffered a silent occlusion between January, and her most recent evaluation in June 2025. During the June 2025 evaluation, the patient had no symptoms of chronic limb threatening ischemia and so expectant management was recommended.   Today she reports that the left great toe wound has worsened. She has a lot of pressure and tenderness particularly around the nailbed more so then the ulcer on plantar aspect of toe. Both she and her husband explain that they have had some apprehension about how to proceed. They have appointment scheduled with Dr. Silva with podiatry tomorrow 10/28. They also arranged to get a second opinion at [facility] on Monday 03/28/24. She currently denies any fever or chills, n/v.   Past Medical History:  Diagnosis Date   Diabetes mellitus    History of blood transfusion    HTN (hypertension), benign 02/13/2012   Hypercholesterolemia    Hyperlipidemia    Hypertension    nodular lymphocyte predominate hodgkins lymphoma dx'd 10/2010   xrt comp 02/20/11   Peripheral vascular disease     Past Surgical History:  Procedure Laterality Date   ABDOMINAL AORTOGRAM W/LOWER EXTREMITY N/A 10/18/2021   Procedure: ABDOMINAL AORTOGRAM W/LOWER EXTREMITY;  Surgeon: Magda Debby SAILOR, MD;  Location: [facility];  Service:  Cardiovascular;  Laterality: N/A;   ABDOMINAL AORTOGRAM W/LOWER EXTREMITY N/A 09/30/2022   Procedure: ABDOMINAL AORTOGRAM W/LOWER EXTREMITY;  Surgeon: Serene Gaile ORN, MD;  Location: [facility];  Service: Cardiovascular;  Laterality: N/A;   ABDOMINAL AORTOGRAM W/LOWER EXTREMITY N/A 06/09/2023   Procedure: ABDOMINAL AORTOGRAM W/LOWER EXTREMITY;  Surgeon: Serene Gaile ORN, MD;  Location: [facility];  Service: Cardiovascular;  Laterality: N/A;   ABDOMINAL AORTOGRAM W/LOWER EXTREMITY N/A 03/11/2024   Procedure: ABDOMINAL AORTOGRAM W/LOWER EXTREMITY;  Surgeon: Magda Debby SAILOR, MD;  Location: [facility];  Service: Cardiovascular;  Laterality: N/A;   AMPUTATION TOE Left 11/01/2021   Procedure: AMPUTATION TOE;  Surgeon: Silva Juliene SAUNDERS, DPM;  Location: [facility];  Service: Podiatry;  Laterality: Left;  will do own block   APPLICATION OF WOUND VAC Left 10/30/2021   Procedure: APPLICATION OF WOUND VAC;  Surgeon: Serene Gaile ORN, MD;  Location: [facility] OR;  Service: Vascular;  Laterality: Left;   BYPASS GRAFT POPLITEAL TO TIBIAL Left 10/30/2021   Procedure: LEFT FEMORAL TO PERONEAL BYPASS GRAFT USING VEIN;  Surgeon: Serene Gaile ORN, MD;  Location: [facility] OR;  Service: Vascular;  Laterality: Left;   CATARACT EXTRACTION Bilateral    CESAREAN SECTION     2   COLONOSCOPY     DILATION AND CURETTAGE OF UTERUS     LOWER EXTREMITY ANGIOGRAPHY N/A 03/11/2024   Procedure: Lower Extremity Angiography;  Surgeon: Magda Debby SAILOR, MD;  Location: [facility];  Service: Cardiovascular;  Laterality: N/A;   PERIPHERAL VASCULAR ATHERECTOMY  06/09/2023  Procedure: PERIPHERAL VASCULAR ATHERECTOMY;  Surgeon: Serene Gaile ORN, MD;  Location: [facility];  Service: Cardiovascular;;   PERIPHERAL VASCULAR BALLOON ANGIOPLASTY  09/30/2022   Procedure: PERIPHERAL VASCULAR BALLOON ANGIOPLASTY;  Surgeon: Serene Gaile ORN, MD;  Location: [facility];  Service: Cardiovascular;;   PERIPHERAL  VASCULAR BALLOON ANGIOPLASTY Left 06/10/2023   Procedure: PERIPHERAL VASCULAR BALLOON ANGIOPLASTY;  Surgeon: Lanis Fonda BRAVO, MD;  Location: [facility];  Service: Cardiovascular;  Laterality: Left;   PERIPHERAL VASCULAR THROMBECTOMY  06/09/2023   Procedure: PERIPHERAL VASCULAR THROMBECTOMY;  Surgeon: Serene Gaile ORN, MD;  Location: [facility];  Service: Cardiovascular;;   PERIPHERAL VASCULAR THROMBECTOMY Left 06/10/2023   Procedure: LYSIS RECHECK;  Surgeon: Lanis Fonda BRAVO, MD;  Location: [facility] INVASIVE CV LAB;  Service: Cardiovascular;  Laterality: Left;   VEIN HARVEST Left 10/30/2021   Procedure: VEIN HARVEST;  Surgeon: Serene Gaile ORN, MD;  Location: [facility] OR;  Service: Vascular;  Laterality: Left;    Social History   Socioeconomic History   Marital status: Married    Spouse name: Not on file   Number of children: Not on file   Years of education: Not on file   Highest education level: Not on file  Occupational History   Not on file  Tobacco Use   Smoking status: Never   Smokeless tobacco: Never  Vaping Use   Vaping status: Never Used  Substance and Sexual Activity   Alcohol use: Yes    Comment: very rarely   Drug use: No   Sexual activity: Not on file  Other Topics Concern   Not on file  Social History Narrative   Not on file   Social Drivers of Health   Financial Resource Strain: Not on file  Food Insecurity: No Food Insecurity (03/10/2024)   Hunger Vital Sign    Worried About Running Out of Food in the Last Year: Never true    Ran Out of Food in the Last Year: Never true  Transportation Needs: No Transportation Needs (03/10/2024)   PRAPARE - Administrator, Civil Service (Medical): No    Lack of Transportation (Non-Medical): No  Physical Activity: Not on file  Stress: Not on file  Social Connections: Socially Integrated (03/10/2024)   Social Connection and Isolation Panel    Frequency of Communication with Friends and Family: Three  times a week    Frequency of Social Gatherings with Friends and Family: More than three times a week    Attends Religious Services: More than 4 times per year    Active Member of [employer] or Organizations: Yes    Attends Engineer, Structural: More than 4 times per year    Marital Status: Married  Catering Manager Violence: Not At Risk (03/10/2024)   Humiliation, Afraid, Rape, and Kick questionnaire    Fear of Current or Ex-Partner: No    Emotionally Abused: No    Physically Abused: No    Sexually Abused: No    Family History  Problem Relation Age of Onset   Diabetes Father    Cancer Maternal Grandfather        lung   Cancer Paternal Grandfather        leukemia    Current Outpatient Medications  Medication Sig Dispense Refill   Accu-Chek FastClix Lancets MISC      aspirin  EC 81 MG tablet Take 1 tablet (81 mg total) by mouth daily. Swallow whole. 30 tablet 12   atorvastatin (LIPITOR)  40 MG tablet Take 1 tablet (40 mg total) by mouth at bedtime. 30 tablet 0   clopidogrel  (PLAVIX ) 75 MG tablet TAKE 1 TABLET(75 MG) BY MOUTH DAILY 30 tablet 3   ezetimibe  (ZETIA ) 10 MG tablet Take 10 mg by mouth daily.     glimepiride (AMARYL) 2 MG tablet Take 2 mg by mouth at bedtime.     glucose blood test strip 1 each by Other route as needed. Use as instructed. One Touch Delica Lancets used also.      Glycerin-Hypromellose-PEG 400 (DRY EYE RELIEF DROPS OP) Place 1 drop into both eyes daily as needed (dry eye).     HYDROcodone -acetaminophen  (NORCO/VICODIN) 5-325 MG tablet Take 1 tablet by mouth every 6 (six) hours as needed for severe pain (pain score 7-10). 30 tablet 0   lisinopril  (PRINIVIL ,ZESTRIL ) 10 MG tablet Take 10 mg by mouth daily.       metFORMIN  (GLUCOPHAGE -XR) 500 MG 24 hr tablet Take 500 mg by mouth 2 (two) times daily.     metoprolol  succinate (TOPROL -XL) 25 MG 24 hr tablet Take 25 mg by mouth daily.  0   Multiple Vitamins-Minerals (MULTIVITAMIN GUMMIES ADULT PO) Take 2 tablets by  mouth daily.     No current facility-administered medications for this visit.    Allergies  Allergen Reactions   Linagliptin     didn't like how she felt on this - not a true allergy - patient has since taken it with no issues     REVIEW OF SYSTEMS:  Negative unless noted in HPI [X]  denotes positive finding, [ ]  denotes negative finding Cardiac  Comments:  Chest pain or chest pressure:    Shortness of breath upon exertion:    Short of breath when lying flat:    Irregular heart rhythm:        Vascular    Pain in calf, thigh, or hip brought on by ambulation:    Pain in feet at night that wakes you up from your sleep:     Blood clot in your veins:    Leg swelling:         Pulmonary    Oxygen at home:    Productive cough:     Wheezing:         Neurologic    Sudden weakness in arms or legs:     Sudden numbness in arms or legs:     Sudden onset of difficulty speaking or slurred speech:    Temporary loss of vision in one eye:     Problems with dizziness:         Gastrointestinal    Blood in stool:     Vomited blood:         Genitourinary    Burning when urinating:     Blood in urine:        Psychiatric    Major depression:         Hematologic    Bleeding problems:    Problems with blood clotting too easily:        Skin    Rashes or ulcers:        Constitutional    Fever or chills:      PHYSICAL EXAMINATION:  Vitals:   03/21/24 0920  BP: 123/75  Pulse: (!) 104  Temp: 98 F (36.7 C)  TempSrc: Temporal  Weight: 155 lb 6.4 oz (70.5 kg)    General:  WDWN in NAD; vital signs documented above Gait: Not observed HENT:  WNL, normocephalic Pulmonary: normal non-labored breathing Cardiac: regular HR Abdomen: soft Vascular Exam/Pulses: 2+ femoral pulses, no distal pulses palpable Extremities: with ischemic changes to left great toe, without Gangrene , with cellulitis of the left great toe; with open wounds of plantar aspect of left great toe as shown  below     Musculoskeletal: no muscle wasting or atrophy  Neurologic: A&O X 3 Psychiatric:  The pt has Normal affect.   Non-Invasive Vascular Imaging:   +-------+-----------+-----------+------------+------------+  ABI/TBIToday's ABIToday's TBIPrevious ABIPrevious TBI  +-------+-----------+-----------+------------+------------+  Right 0.82       0.11       0.72        0.46          +-------+-----------+-----------+------------+------------+  Left  0.55       0.0        0.0         0.0           +-------+-----------+-----------+------------+------------+     ASSESSMENT/PLAN:: 73 y.o. 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 She just recently underwent Aortogram on 03/11/24 by Dr. Magda. This showed known occluded LLE bypass, occlusion of native left popliteal, AT and PT arteries. Single vessel peroneal runoff. Redo bypass was recommended. She is here today to re discuss her options on how to proceed. Her Angiogram images were reviewed today and looks like she has options for redo left femoral to peroneal bypass. She does not have a venous conduit available for the bypass so will need to proceed with PTFE. Patient see with Dr. Serene. We both explained to patient and her husband that without improved blood flow she is at high risk for more proximal amputation. Essentially her only option is to restore blood flow to her foot for attempted limb salvage. She is not a candidate for a lysis or mechanical thrombectomy of her occluded bypass this far out. We explained that there is no medical management that will improve her blood flow at this point. Patient and her husband voiced their understanding that even with a bypass she is not guaranteed to heal her wound or toe amputation and may still require more proximal amputation in the future. Also reiterated that the bypass likely will not stay patent long term since it is prosthetic but goal is to get her  wound healed. They voiced their understanding and are agreeable to proceed. Recommend direct admission to the Hospital today under the Hospitalist service today with IV antibiotics. I have instructed her to hold her Plavix . She will be scheduled for left femoral to peroneal bypass with PTFE with Dr. Serene on Wednesday 03/23/24.  Teretha Damme, PA-C [facility] [phone]  Clinic MD:   Serene

## 2024-03-21 NOTE — H&P (Signed)
 Office Note        CC:  follow up Requesting Provider:  Claudene Pellet, MD   HPI: Katie Hawkins is a 73 y.o. 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 She just recently underwent Aortogram on 03/11/24 by Dr. Magda. This showed known occluded LLE bypass, occlusion of native left popliteal, AT and PT arteries. Single vessel peroneal runoff. Continued medical management and discussion of redo bypass were recommended.   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 Serene. This subsequently underwent endovascular intervention in May of 2024 and again in January 2025. The bypass suffered a silent occlusion between January, and her most recent evaluation in June 2025. During the June 2025 evaluation, the patient had no symptoms of chronic limb threatening ischemia and so expectant management was recommended.    Today she reports that the left great toe wound has worsened. She has a lot of pressure and tenderness particularly around the nailbed more so then the ulcer on plantar aspect of toe. Both she and her husband explain that they have had some apprehension about how to proceed. They have appointment scheduled with Dr. Silva with podiatry tomorrow 10/28. They also arranged to get a second opinion at [facility] on Monday 03/28/24. She currently denies any fever or chills, n/v.        Past Medical History:  Diagnosis Date   Diabetes mellitus     History of blood transfusion     HTN (hypertension), benign 02/13/2012   Hypercholesterolemia     Hyperlipidemia     Hypertension     nodular lymphocyte predominate hodgkins lymphoma dx'd 10/2010    xrt comp 02/20/11   Peripheral vascular disease                 Past Surgical History:  Procedure Laterality Date   ABDOMINAL AORTOGRAM W/LOWER EXTREMITY N/A 10/18/2021    Procedure: ABDOMINAL AORTOGRAM W/LOWER EXTREMITY;  Surgeon: Magda Debby SAILOR, MD;  Location:  [facility];  Service: Cardiovascular;  Laterality: N/A;   ABDOMINAL AORTOGRAM W/LOWER EXTREMITY N/A 09/30/2022    Procedure: ABDOMINAL AORTOGRAM W/LOWER EXTREMITY;  Surgeon: Serene Gaile ORN, MD;  Location: [facility];  Service: Cardiovascular;  Laterality: N/A;   ABDOMINAL AORTOGRAM W/LOWER EXTREMITY N/A 06/09/2023    Procedure: ABDOMINAL AORTOGRAM W/LOWER EXTREMITY;  Surgeon: Serene Gaile ORN, MD;  Location: [facility];  Service: Cardiovascular;  Laterality: N/A;   ABDOMINAL AORTOGRAM W/LOWER EXTREMITY N/A 03/11/2024    Procedure: ABDOMINAL AORTOGRAM W/LOWER EXTREMITY;  Surgeon: Magda Debby SAILOR, MD;  Location: [facility];  Service: Cardiovascular;  Laterality: N/A;   AMPUTATION TOE Left 11/01/2021    Procedure: AMPUTATION TOE;  Surgeon: Silva Juliene SAUNDERS, DPM;  Location: [facility] OR;  Service: Podiatry;  Laterality: Left;  will do own block   APPLICATION OF WOUND VAC Left 10/30/2021    Procedure: APPLICATION OF WOUND VAC;  Surgeon: Serene Gaile ORN, MD;  Location: [facility] OR;  Service: Vascular;  Laterality: Left;   BYPASS GRAFT POPLITEAL TO TIBIAL Left 10/30/2021    Procedure: LEFT FEMORAL TO PERONEAL BYPASS GRAFT USING VEIN;  Surgeon: Serene Gaile ORN, MD;  Location: [facility] OR;  Service: Vascular;  Laterality: Left;   CATARACT EXTRACTION Bilateral     CESAREAN SECTION        2   COLONOSCOPY       DILATION AND CURETTAGE OF UTERUS  LOWER EXTREMITY ANGIOGRAPHY N/A 03/11/2024    Procedure: Lower Extremity Angiography;  Surgeon: Magda Debby SAILOR, MD;  Location: [facility] IN[facility] CV [facility];  Service: Cardiovascular;  Laterality: N/A;   PERIPHERAL VASCULAR ATHERECTOMY   06/09/2023    Procedure: PERIPHERAL VASCULAR ATHERECTOMY;  Surgeon: Serene Gaile ORN, MD;  Location: [facility];  Service: Cardiovascular;;   PERIPHERAL VASCULAR BALLOON ANGIOPLASTY   09/30/2022    Procedure: PERIPHERAL VASCULAR BALLOON ANGIOPLASTY;  Surgeon: Serene Gaile ORN, MD;  Location: [facility];  Service: Cardiovascular;;   PERIPHERAL VASCULAR BALLOON ANGIOPLASTY Left 06/10/2023    Procedure: PERIPHERAL VASCULAR BALLOON ANGIOPLASTY;  Surgeon: Lanis Fonda BRAVO, MD;  Location: [facility] INVASIVE CV LAB;  Service: Cardiovascular;  Laterality: Left;   PERIPHERAL VASCULAR THROMBECTOMY   06/09/2023    Procedure: PERIPHERAL VASCULAR THROMBECTOMY;  Surgeon: Serene Gaile ORN, MD;  Location: [facility];  Service: Cardiovascular;;   PERIPHERAL VASCULAR THROMBECTOMY Left 06/10/2023    Procedure: LYSIS RECHECK;  Surgeon: Lanis Fonda BRAVO, MD;  Location: [facility] INVASIVE CV LAB;  Service: Cardiovascular;  Laterality: Left;   VEIN HARVEST Left 10/30/2021    Procedure: VEIN HARVEST;  Surgeon: Serene Gaile ORN, MD;  Location: [facility] OR;  Service: Vascular;  Laterality: Left;          Social History         Socioeconomic History   Marital status: Married      Spouse name: Not on file   Number of children: Not on file   Years of education: Not on file   Highest education level: Not on file  Occupational History   Not on file  Tobacco Use   Smoking status: Never   Smokeless tobacco: Never  Vaping Use   Vaping status: Never Used  Substance and Sexual Activity   Alcohol use: Yes      Comment: very rarely   Drug use: No   Sexual activity: Not on file  Other Topics Concern   Not on file  Social History Narrative   Not on file    Social Drivers of Health        Financial Resource Strain: Not on file  Food Insecurity: No Food Insecurity (03/10/2024)    Hunger Vital Sign     Worried About Running Out of Food in the Last Year: Never true     Ran Out of Food in the Last Year: Never true  Transportation Needs: No Transportation Needs (03/10/2024)    PRAPARE - Therapist, Art (Medical): No     Lack of Transportation (Non-Medical): No  Physical Activity: Not on file  Stress: Not on file  Social Connections: Socially Integrated (03/10/2024)    Social Connection  and Isolation Panel     Frequency of Communication with Friends and Family: Three times a week     Frequency of Social Gatherings with Friends and Family: More than three times a week     Attends Religious Services: More than 4 times per year     Active Member of [employer] or Organizations: Yes     Attends Banker Meetings: More than 4 times per year     Marital Status: Married  Catering Manager Violence: Not At Risk (03/10/2024)    Humiliation, Afraid, Rape, and Kick questionnaire     Fear of Current or Ex-Partner: No     Emotionally Abused: No     Physically Abused: No     Sexually Abused:  No           Family History  Problem Relation Age of Onset   Diabetes Father     Cancer Maternal Grandfather          lung   Cancer Paternal Grandfather          leukemia                Current Outpatient Medications  Medication Sig Dispense Refill   Accu-Chek FastClix Lancets MISC         aspirin  EC 81 MG tablet Take 1 tablet (81 mg total) by mouth daily. Swallow whole. 30 tablet 12   atorvastatin (LIPITOR) 40 MG tablet Take 1 tablet (40 mg total) by mouth at bedtime. 30 tablet 0   clopidogrel  (PLAVIX ) 75 MG tablet TAKE 1 TABLET(75 MG) BY MOUTH DAILY 30 tablet 3   ezetimibe  (ZETIA ) 10 MG tablet Take 10 mg by mouth daily.       glimepiride (AMARYL) 2 MG tablet Take 2 mg by mouth at bedtime.       glucose blood test strip 1 each by Other route as needed. Use as instructed. One Touch Delica Lancets used also.        Glycerin-Hypromellose-PEG 400 (DRY EYE RELIEF DROPS OP) Place 1 drop into both eyes daily as needed (dry eye).       HYDROcodone -acetaminophen  (NORCO/VICODIN) 5-325 MG tablet Take 1 tablet by mouth every 6 (six) hours as needed for severe pain (pain score 7-10). 30 tablet 0   lisinopril  (PRINIVIL ,ZESTRIL ) 10 MG tablet Take 10 mg by mouth daily.         metFORMIN  (GLUCOPHAGE -XR) 500 MG 24 hr tablet Take 500 mg by mouth 2 (two) times daily.       metoprolol  succinate  (TOPROL -XL) 25 MG 24 hr tablet Take 25 mg by mouth daily.   0   Multiple Vitamins-Minerals (MULTIVITAMIN GUMMIES ADULT PO) Take 2 tablets by mouth daily.          No current facility-administered medications for this visit.        Allergies       Allergies  Allergen Reactions   Linagliptin        didn't like how she felt on this - not a true allergy - patient has since taken it with no issues          REVIEW OF SYSTEMS:  Negative unless noted in HPI [X]  denotes positive finding, [ ]  denotes negative finding Cardiac   Comments:  Chest pain or chest pressure:      Shortness of breath upon exertion:      Short of breath when lying flat:      Irregular heart rhythm:             Vascular      Pain in calf, thigh, or hip brought on by ambulation:      Pain in feet at night that wakes you up from your sleep:       Blood clot in your veins:      Leg swelling:              Pulmonary      Oxygen at home:      Productive cough:       Wheezing:              Neurologic      Sudden weakness in arms or legs:       Sudden numbness in  arms or legs:       Sudden onset of difficulty speaking or slurred speech:      Temporary loss of vision in one eye:       Problems with dizziness:              Gastrointestinal      Blood in stool:       Vomited blood:              Genitourinary      Burning when urinating:       Blood in urine:             Psychiatric      Major depression:              Hematologic      Bleeding problems:      Problems with blood clotting too easily:             Skin      Rashes or ulcers:             Constitutional      Fever or chills:          PHYSICAL EXAMINATION:      Vitals:    03/21/24 0920  BP: 123/75  Pulse: (!) 104  Temp: 98 F (36.7 C)  TempSrc: Temporal  Weight: 155 lb 6.4 oz (70.5 kg)      General:  WDWN in NAD; vital signs documented above Gait: Not observed HENT: WNL, normocephalic Pulmonary: normal non-labored  breathing Cardiac: regular HR Abdomen: soft Vascular Exam/Pulses: 2+ femoral pulses, no distal pulses palpable Extremities: with ischemic changes to left great toe, without Gangrene , with cellulitis of the left great toe; with open wounds of plantar aspect of left great toe as shown below       Musculoskeletal: no muscle wasting or atrophy       Neurologic: A&O X 3 Psychiatric:  The pt has Normal affect.     Non-Invasive Vascular Imaging:   +-------+-----------+-----------+------------+------------+  ABI/TBIToday's ABIToday's TBIPrevious ABIPrevious TBI  +-------+-----------+-----------+------------+------------+  Right 0.82       0.11       0.72        0.46          +-------+-----------+-----------+------------+------------+  Left  0.55       0.0        0.0         0.0           +-------+-----------+-----------+------------+------------+     ASSESSMENT/PLAN:: 73 y.o. 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 She just recently underwent Aortogram on 03/11/24 by Dr. Magda. This showed known occluded LLE bypass, occlusion of native left popliteal, AT and PT arteries. Single vessel peroneal runoff. Redo bypass was recommended. She is here today to re discuss her options on how to proceed. Her Angiogram images were reviewed today and looks like she has options for redo left femoral to peroneal bypass. She does not have a venous conduit available for the bypass so will need to proceed with PTFE. Patient see with Dr. Serene. We both explained to patient and her husband that without improved blood flow she is at high risk for more proximal amputation. Essentially her only option is to restore blood flow to her foot for attempted limb salvage. She is not a candidate for a lysis or mechanical thrombectomy of her occluded bypass this far out. We explained that there is no  medical management that will improve her blood flow at this point. Patient and  her husband voiced their understanding that even with a bypass she is not guaranteed to heal her wound or toe amputation and may still require more proximal amputation in the future. Also reiterated that the bypass likely will not stay patent long term since it is prosthetic but goal is to get her wound healed. They voiced their understanding and are agreeable to proceed. Recommend direct admission to the Hospital today under the Hospitalist service today with IV antibiotics. I have instructed her to hold her Plavix . She will be scheduled for left femoral to peroneal bypass with PTFE with Dr. Serene on Wednesday 03/23/24.   Teretha Damme, PA-C [facility] [phone]   Clinic MD:   Serene

## 2024-03-21 NOTE — Progress Notes (Signed)
 Pharmacy Antibiotic Note  Katie Hawkins is a 73 y.o. female admitted on 03/21/2024 with wound infection.  Pharmacy has been consulted for vanc/zosyn dosing.  Pt with a hx of PAD and recent aortogram presented with non healing would. Plan for bypass this wed. Last scr stable    Plan: Zosyn 3.375g IV q8 Vanc 1g IV q24>> AUC 424, scr 0.8 Levels if needed     Temp (24hrs), Avg:98.6 F (37 C), Min:98 F (36.7 C), Max:99.1 F (37.3 C)  No results for input(s): WBC, CREATININE, LATICACIDVEN, VANCOTROUGH, VANCOPEAK, VANCORANDOM, GENTTROUGH, GENTPEAK, GENTRANDOM, TOBRATROUGH, TOBRAPEAK, TOBRARND, AMIKACINPEAK, AMIKACINTROU, AMIKACIN in the last 168 hours.  Estimated Creatinine Clearance: 58 mL/min (by C-G formula based on SCr of 0.51 mg/dL).    Allergies  Allergen Reactions   Linagliptin     didn't like how she felt on this - not a true allergy - patient has since taken it with no issues    Antimicrobials this admission: 10/27 vanc>> 10/27 zosyn>>  Dose adjustments this admission:   Microbiology results:   Sergio Batch, PharmD, BCIDP, AAHIVP, CPP Infectious Disease Pharmacist 03/21/2024 3:35 PM

## 2024-03-21 NOTE — Consult Note (Addendum)
 Initial Consultation Note: Hospitalist service to take over as primary    Katie Hawkins FMW:[id number] DOB: 01-11-1951 DOA: 03/21/2024  PCP: Claudene Pellet, MD   Requesting service: Vascular surgery   Patient coming from: Vascular surgery office  Chief Complaint: Limb ischemia  HPI/course: Katie Hawkins is a 73 y.o. female with medical history significant of PAD, limb ischemia, chronic foot ulcer, obesity, hypertension, hyperlipidemia, diabetes, Hodgkin lymphoma presenting with limb ischemia.  Patient with extensive vascular history was admitted 10/16-10/17 with cellulitis on chronic ischemia/wound.  Early osteomyelitis on MRI.  Underwent angiogram 10/17 by vascular surgery recommended for follow-up outpatient for bypass.  Also plan to follow-up with podiatry outpatient.  Patient went for follow-up at vascular surgery office yesterday and reported that her left great toe wound was worsened and more tender after discussing there it was decided to bring patient into the hospital for limb ischemia for attempted revascularization with redo bypass to promote healing of this ulcer and allow for healing for any potential future amputation. Detailed PAD history noted in recent office visit and H&P.   Patient initially was to be met admitted directly under hospital service with vascular surgery consult but vascular surgery admitted yesterday instead and asked for us  to take over today.  Vital signs largely stable, noted to have tachycardia.  Lab workup pending.  Antibiotics, pain medication has been ordered.  Patient denies fevers, chills, chest pain, shortness of breath, abdominal pain, constipation, diarrhea, nausea, vomiting.  Review of Systems: As per HPI otherwise all other systems reviewed and are negative.   Past Medical History:  Diagnosis Date   Diabetes mellitus    History of blood transfusion    HTN (hypertension), benign 02/13/2012   Hypercholesterolemia    Hyperlipidemia     Hypertension    nodular lymphocyte predominate hodgkins lymphoma dx'd 10/2010   xrt comp 02/20/11   Peripheral vascular disease     Past Surgical History:  Procedure Laterality Date   ABDOMINAL AORTOGRAM W/LOWER EXTREMITY N/A 10/18/2021   Procedure: ABDOMINAL AORTOGRAM W/LOWER EXTREMITY;  Surgeon: Magda Debby SAILOR, MD;  Location: [facility];  Service: Cardiovascular;  Laterality: N/A;   ABDOMINAL AORTOGRAM W/LOWER EXTREMITY N/A 09/30/2022   Procedure: ABDOMINAL AORTOGRAM W/LOWER EXTREMITY;  Surgeon: Serene Gaile ORN, MD;  Location: [facility];  Service: Cardiovascular;  Laterality: N/A;   ABDOMINAL AORTOGRAM W/LOWER EXTREMITY N/A 06/09/2023   Procedure: ABDOMINAL AORTOGRAM W/LOWER EXTREMITY;  Surgeon: Serene Gaile ORN, MD;  Location: [facility];  Service: Cardiovascular;  Laterality: N/A;   ABDOMINAL AORTOGRAM W/LOWER EXTREMITY N/A 03/11/2024   Procedure: ABDOMINAL AORTOGRAM W/LOWER EXTREMITY;  Surgeon: Magda Debby SAILOR, MD;  Location: [facility];  Service: Cardiovascular;  Laterality: N/A;   AMPUTATION TOE Left 11/01/2021   Procedure: AMPUTATION TOE;  Surgeon: Silva Juliene SAUNDERS, DPM;  Location: [facility] OR;  Service: Podiatry;  Laterality: Left;  will do own block   APPLICATION OF WOUND VAC Left 10/30/2021   Procedure: APPLICATION OF WOUND VAC;  Surgeon: Serene Gaile ORN, MD;  Location: [facility] OR;  Service: Vascular;  Laterality: Left;   BYPASS GRAFT POPLITEAL TO TIBIAL Left 10/30/2021   Procedure: LEFT FEMORAL TO PERONEAL BYPASS GRAFT USING VEIN;  Surgeon: Serene Gaile ORN, MD;  Location: [facility] OR;  Service: Vascular;  Laterality: Left;   CATARACT EXTRACTION Bilateral    CESAREAN SECTION     2   COLONOSCOPY     DILATION AND CURETTAGE OF UTERUS     LOWER EXTREMITY  ANGIOGRAPHY N/A 03/11/2024   Procedure: Lower Extremity Angiography;  Surgeon: Magda Debby SAILOR, MD;  Location[facility] INVASIVE CV [facility];  Service: Cardiovascular;  Laterality: N/A;   PERIPHERAL VASCULAR ATHERECTOMY   06/09/2023   Procedure: PERIPHERAL VASCULAR ATHERECTOMY;  Surgeon: Serene Gaile ORN, MD;  [facility]: [facility];  Service: Cardiovascular;;   PERIPHERAL VASCULAR BALLOON ANGIOPLASTY  09/30/2022   Procedure: PERIPHERAL VASCULAR BALLOON ANGIOPLASTY;  Surgeon: Serene Gaile ORN, MD;  [facility]: [facility];  Service: Cardiovascular;;   PERIPHERAL VASCULAR BALLOON ANGIOPLASTY Left 06/10/2023   Procedure: PERIPHERAL VASCULAR BALLOON ANGIOPLASTY;  Surgeon: Lanis Fonda BRAVO, MD;  Location: [facility] INVASIVE CV LAB;  Service: Cardiovascular;  Laterality: Left;   PERIPHERAL VASCULAR THROMBECTOMY  06/09/2023   Procedure: PERIPHERAL VASCULAR THROMBECTOMY;  Surgeon: Serene Gaile ORN, MD;  Location: [facility];  Service: Cardiovascular;;   PERIPHERAL VASCULAR THROMBECTOMY Left 06/10/2023   Procedure: LYSIS RECHECK;  Surgeon: Lanis Fonda BRAVO, MD;  Location: [facility] CV LAB;  Service: Cardiovascular;  Laterality: Left;   VEIN HARVEST Left 10/30/2021   Procedure: VEIN HARVEST;  Surgeon: Serene Gaile ORN, MD;  Location: [facility] OR;  Service: Vascular;  Laterality: Left;    Social History  reports that she has never smoked. She has never used smokeless tobacco. She reports current alcohol use. She reports that she does not use drugs.  Allergies  Allergen Reactions   Linagliptin     didn't like how she felt on this - not a true allergy - patient has since taken it with no issues    Family History  Problem Relation Age of Onset   Diabetes Father    Cancer Maternal Grandfather        lung   Cancer Paternal Grandfather        leukemia  Reviewed on admission  Prior to Admission medications   Medication Sig Start Date End Date Taking? Authorizing Provider  aspirin  EC 81 MG tablet Take 1 tablet (81 mg total) by mouth daily. Swallow whole. 06/10/23   Serene Gaile ORN, MD  atorvastatin (LIPITOR) 40 MG tablet Take 1 tablet (40 mg total) by mouth at bedtime. 03/11/24 04/10/24  Cindy Garnette POUR, MD   clopidogrel  (PLAVIX ) 75 MG tablet TAKE 1 TABLET(75 MG) BY MOUTH DAILY 12/03/23   Serene Gaile ORN, MD  ezetimibe  (ZETIA ) 10 MG tablet Take 10 mg by mouth daily. 09/06/21   [provider]  glimepiride (AMARYL) 2 MG tablet Take 2 mg by mouth at bedtime. 09/12/21   [provider]  Glycerin-Hypromellose-PEG 400 (DRY EYE RELIEF DROPS OP) Place 1 drop into both eyes daily as needed (dry eye).    [provider]  HYDROcodone -acetaminophen  (NORCO/VICODIN) 5-325 MG tablet Take 1 tablet by mouth every 6 (six) hours as needed for severe pain (pain score 7-10). 03/17/24   Lanis Fonda BRAVO, MD  lisinopril  (PRINIVIL ,ZESTRIL ) 10 MG tablet Take 10 mg by mouth daily.      [provider]  metFORMIN  (GLUCOPHAGE -XR) 500 MG 24 hr tablet Take 500 mg by mouth 2 (two) times daily. 07/17/21   [provider]  metoprolol  succinate (TOPROL -XL) 25 MG 24 hr tablet Take 25 mg by mouth daily. 01/10/15   [provider]  Multiple Vitamins-Minerals (MULTIVITAMIN GUMMIES ADULT PO) Take 2 tablets by mouth daily.    [provider]    Physical Exam: Vitals:   03/21/24 1330  BP: 108/67  Pulse: (!) 104  Resp: 19  Temp: 99.1 F (37.3 C)  TempSrc: Oral  SpO2: 95%    Physical Exam Constitutional:      General: She is not in acute distress.    Appearance: Normal appearance.  HENT:     Head: Normocephalic and atraumatic.     Mouth/Throat:     Mouth: Mucous membranes are moist.     Pharynx: Oropharynx is clear.  Eyes:     Extraocular Movements: Extraocular movements intact.     Pupils: Pupils are equal, round, and reactive to light.  Cardiovascular:     Rate and Rhythm: Normal rate and regular rhythm.     Pulses: Normal pulses.     Heart sounds: Normal heart sounds.  Pulmonary:     Effort: Pulmonary effort is normal. No respiratory distress.     Breath sounds: Normal breath sounds.  Abdominal:     General: Bowel sounds are normal. There is no distension.      Palpations: Abdomen is soft.     Tenderness: There is no abdominal tenderness.  Musculoskeletal:        General: No swelling or deformity.     Comments: Foot wound at plantar surface at base of L fist toe   Skin:    General: Skin is warm and dry.  Neurological:     General: No focal deficit present.     Mental Status: Mental status is at baseline.     Labs on Admission: I have personally reviewed following labs and imaging studies  CBC: No results for input(s): WBC, NEUTROABS, HGB, HCT, MCV, PLT in the last 168 hours.  Basic Metabolic Panel: No results for input(s): NA, K, CL, CO2, GLUCOSE, BUN, CREATININE, CALCIUM , MG, PHOS in the last 168 hours.  GFR: Estimated Creatinine Clearance: 58 mL/min (by C-G formula based on SCr of 0.51 mg/dL).  Liver Function Tests: No results for input(s): AST, ALT, ALKPHOS, BILITOT, PROT, ALBUMIN in the last 168 hours.  Urine analysis:    Component Value Date/Time   COLORURINE STRAW (A) 10/30/2021 1310   APPEARANCEUR CLEAR 10/30/2021 1310   LABSPEC 1.005 10/30/2021 1310   PHURINE 6.0 10/30/2021 1310   GLUCOSEU NEGATIVE 10/30/2021 1310   HGBUR NEGATIVE 10/30/2021 1310   BILIRUBINUR NEGATIVE 10/30/2021 1310   KETONESUR 5 (A) 10/30/2021 1310   PROTEINUR NEGATIVE 10/30/2021 1310   NITRITE NEGATIVE 10/30/2021 1310   LEUKOCYTESUR NEGATIVE 10/30/2021 1310    Radiological Exams on Admission: VAS US  ABI WITH/WO TBI Result Date: 03/21/2024  LOWER EXTREMITY DOPPLER STUDY Patient Name:  MARIJANE TROWER  Date of Exam:   03/21/2024 Medical Rec #: [id number]         Accession #:    [id number] Date of Birth: [birth year]         Patient Gender: F Patient Age:   19 years Exam Location:  [address] Procedure:      VAS US  ABI WITH/WO TBI Referring Phys: TERETHA DAMME --------------------------------------------------------------------------------  Indications: Peripheral artery disease.  Vascular  Interventions: /7/23:Left CFA to peroneal bypass graft with ipsilateral                         nonreversed translocated saphenous vein                         11/02/21: Left 3d toe amputation                         DCBA left fem-pero bypass.  06/10/23: Lysis occluded bypass graft.                         DCBA bypass graft.                         Peroneal angioplasty. Comparison Study: 10/26/23 Performing Technologist: Duwaine Hives RVS  Examination Guidelines: A complete evaluation includes at minimum, Doppler waveform signals and systolic blood pressure reading at the level of bilateral brachial, anterior tibial, and posterior tibial arteries, when vessel segments are accessible. Bilateral testing is considered an integral part of a complete examination. Photoelectric Plethysmograph (PPG) waveforms and toe systolic pressure readings are included as required and additional duplex testing as needed. Limited examinations for reoccurring indications may be performed as noted.  ABI Findings: +---------+------------------+-----+----------+--------+ Right    Rt Pressure (mmHg)IndexWaveform  Comment  +---------+------------------+-----+----------+--------+ Brachial 133                                       +---------+------------------+-----+----------+--------+ PTA      102               0.77 monophasic         +---------+------------------+-----+----------+--------+ DP       109               0.82 monophasic         +---------+------------------+-----+----------+--------+ Great Toe14                0.11                    +---------+------------------+-----+----------+--------+ +---------+------------------+-----+-------------------+-------+ Left     Lt Pressure (mmHg)IndexWaveform           Comment +---------+------------------+-----+-------------------+-------+ Brachial 133                                                +---------+------------------+-----+-------------------+-------+ PTA      63                0.47 dampened monophasic        +---------+------------------+-----+-------------------+-------+ DP       73                0.55 dampened monophasic        +---------+------------------+-----+-------------------+-------+ Great Toe0                 0.00 Absent                     +---------+------------------+-----+-------------------+-------+ +-------+-----------+-----------+------------+------------+ ABI/TBIToday's ABIToday's TBIPrevious ABIPrevious TBI +-------+-----------+-----------+------------+------------+ Right  0.82       0.11       0.72        0.46         +-------+-----------+-----------+------------+------------+ Left   0.55       0.0        0.0         0.0          +-------+-----------+-----------+------------+------------+  Summary: Right: Resting right ankle-brachial index indicates mild right lower extremity arterial disease. The right toe-brachial index is abnormal.  Left: Resting left ankle-brachial index indicates moderate left lower extremity arterial disease. The left toe-brachial index is abnormal.  *See table(s) above  for measurements and observations.  Electronically signed by Gaile New MD on 03/21/2024 at 10:22:03 AM.    Final    EKG: Not performed thus far  Assessment/Plan Principal Problem:   Critical lower limb ischemia (HCC) Active Problems:   Type 2 diabetes mellitus with complication, without long-term current use of insulin  (HCC)   Hyperlipidemia associated with type 2 diabetes mellitus (HCC)   Hypertension associated with diabetes (HCC)   Obesity   Personal history of Hodgkin lymphoma   PAD (peripheral artery disease)   Ulcer of left foot (HCC)   Critical limb ischemia PAD Ischemic foot ulcer > As per HPI and recent vascular surgery notes.  Patient with known extensive PAD presenting with worsening foot ulcer as direct admit from  vascular surgery office for redo bypass to allow for healing of wound and healing of potential future amputation if needed. > History of left femoral to peroneal bypass in 2023 with endovascular intervention in 2024 and early 2025.  Subsequently developed silent occlusion noted on reevaluation in June 2025, asymptomatic at that time.  Patient seen earlier this month with evaluation by vascular surgery showing known occlusion of the bypass with occlusion of native left popliteal, AT, PT arteries.  Followed up outpatient for consideration of redo bypass. > As above, at follow-up wound had worsened and patient was directly admitted for ischemia with plan for redo bypass. -Monitoring on progressive unit - Appreciate vascular surgery recommendations and assistance - Intervention anticipated 10/29. - Continue with vancomycin and Zosyn as ordered - Hold Plavix  - Continue ASA - On heparin  - As needed pain medication - Continue atorvastatin, Zetia   Hypertension - Continue lisinopril , metoprolol   Hyperlipidemia - Continue home atorvastatin, Zetia   Diabetes - SSI  History of Hodgkin's lymphoma - Noted  Obesity - Noted  DVT prophylaxis: Heparin  Code Status:   Full Family Communication:  None on admission  Disposition Plan:   Patient is from:  Home  Anticipated DC to:  Home  Anticipated DC date:  2 to 4 days  Anticipated DC barriers: None Consults called:  Vascular surgery following Admission status:  Inpatient, progressive  Severity of Illness: The appropriate patient status for this patient is INPATIENT. Inpatient status is judged to be reasonable and necessary in order to provide the required intensity of service to ensure the patient's safety. The patient's presenting symptoms, physical exam findings, and initial radiographic and laboratory data in the context of their chronic comorbidities is felt to place them at high risk for further clinical deterioration. Furthermore, it is not  anticipated that the patient will be medically stable for discharge from the hospital within 2 midnights of admission.   * I certify that at the point of admission it is my clinical judgment that the patient will require inpatient hospital care spanning beyond 2 midnights from the point of admission due to high intensity of service, high risk for further deterioration and high frequency of surveillance required.DEWAINE Marsa KATHEE Seena MD [facility]  How to contact the [facility] Attending or Consulting provider 7A - 7P or covering provider during after hours 7P -7A, for this patient?   Check the care team in [facility] and look for a) attending/consulting [facility] provider listed and b) the [facility] team listed Log into [email or web address] and use [place]'s universal password to access. If you do not have the password, please contact the hospital operator. Locate the [facility] provider you are looking for under [facility] and page to a number that you can  be directly reached. If you still have difficulty reaching the provider, please page the [facility] (Director on Call) for the Hospitalists listed on amion for assistance.  03/21/2024, 4:27 PM

## 2024-03-21 NOTE — Progress Notes (Signed)
 Pt received direct from VVS office, accompanied by husband.  Pt just discharged last Friday.  Tele applied and CCMD notified.  VS taken and charted.  Plan of care reviewed.  Will alert [facility] that Pt has arrived and request admission orders

## 2024-03-22 ENCOUNTER — Ambulatory Visit: Admitting: Podiatry

## 2024-03-22 DIAGNOSIS — I70229 Atherosclerosis of native arteries of extremities with rest pain, unspecified extremity: Secondary | ICD-10-CM | POA: Diagnosis not present

## 2024-03-22 LAB — GLUCOSE, CAPILLARY
Glucose-Capillary: 218 mg/dL — ABNORMAL HIGH (ref 70–99)
Glucose-Capillary: 359 mg/dL — ABNORMAL HIGH (ref 70–99)
Glucose-Capillary: 323 mg/dL — ABNORMAL HIGH (ref 70–99)

## 2024-03-22 MED ORDER — INSULIN GLARGINE-YFGN 100 UNIT/ML ~~LOC~~ SOLN
15.0000 [IU] | Freq: Every day | SUBCUTANEOUS | Status: DC
  Administered 2024-03-23 – 2024-03-28 (×6): 15 [IU] via SUBCUTANEOUS
  Filled 2024-03-22 (×7): qty 0.15

## 2024-03-22 NOTE — Plan of Care (Signed)

## 2024-03-23 ENCOUNTER — Encounter (HOSPITAL_COMMUNITY)
Admission: RE | Disposition: A | Discharge status: Home / Self Care | Payer: Self-pay | Source: Home / Self Care | Attending: Internal Medicine

## 2024-03-23 ENCOUNTER — Other Ambulatory Visit: Payer: Self-pay

## 2024-03-23 ENCOUNTER — Inpatient Hospital Stay (HOSPITAL_COMMUNITY): Payer: Self-pay | Admitting: Certified Registered Nurse Anesthetist

## 2024-03-23 DIAGNOSIS — Z7984 Long term (current) use of oral hypoglycemic drugs: Secondary | ICD-10-CM | POA: Diagnosis not present

## 2024-03-23 DIAGNOSIS — I70229 Atherosclerosis of native arteries of extremities with rest pain, unspecified extremity: Secondary | ICD-10-CM | POA: Diagnosis not present

## 2024-03-23 DIAGNOSIS — I70362 Atherosclerosis of unspecified type of bypass graft(s) of the extremities with gangrene, left leg: Secondary | ICD-10-CM

## 2024-03-23 DIAGNOSIS — E119 Type 2 diabetes mellitus without complications: Secondary | ICD-10-CM | POA: Diagnosis not present

## 2024-03-23 DIAGNOSIS — I70262 Atherosclerosis of native arteries of extremities with gangrene, left leg: Secondary | ICD-10-CM

## 2024-03-23 DIAGNOSIS — I10 Essential (primary) hypertension: Secondary | ICD-10-CM

## 2024-03-23 DIAGNOSIS — I70245 Atherosclerosis of native arteries of left leg with ulceration of other part of foot: Secondary | ICD-10-CM

## 2024-03-23 HISTORY — PX: BYPASS GRAFT FEMORAL-PERONEAL: SHX5762

## 2024-03-23 LAB — URINALYSIS, ROUTINE W REFLEX MICROSCOPIC
Bacteria, UA: NONE SEEN
Bilirubin Urine: NEGATIVE
Glucose, UA: 50 mg/dL — AB
Hgb urine dipstick: NEGATIVE
Ketones, ur: NEGATIVE mg/dL
Nitrite: NEGATIVE
Protein, ur: NEGATIVE mg/dL
Specific Gravity, Urine: 1.014 (ref 1.005–1.030)
pH: 6 (ref 5.0–8.0)

## 2024-03-23 LAB — COMPREHENSIVE METABOLIC PANEL WITH GFR
ALT: 18 U/L (ref 0–44)
AST: 17 U/L (ref 15–41)
Albumin: 2.8 g/dL — ABNORMAL LOW (ref 3.5–5.0)
Alkaline Phosphatase: 52 U/L (ref 38–126)
Anion gap: 9 (ref 5–15)
BUN: 11 mg/dL (ref 8–23)
CO2: 28 mmol/L (ref 22–32)
Calcium: 9.2 mg/dL (ref 8.9–10.3)
Chloride: 97 mmol/L — ABNORMAL LOW (ref 98–111)
Creatinine, Ser: 0.69 mg/dL (ref 0.44–1.00)
GFR, Estimated: >60 mL/min (ref >60)
Glucose, Bld: 240 mg/dL — ABNORMAL HIGH (ref 70–99)
Potassium: 4.4 mmol/L (ref 3.5–5.1)
Sodium: 134 mmol/L — ABNORMAL LOW (ref 135–145)
Total Bilirubin: 0.4 mg/dL (ref 0.0–1.2)
Total Protein: 6.5 g/dL (ref 6.5–8.1)

## 2024-03-23 LAB — CBC
HCT: 34 % — ABNORMAL LOW (ref 36.0–46.0)
Hemoglobin: 11.1 g/dL — ABNORMAL LOW (ref 12.0–15.0)
MCH: 30.1 pg (ref 26.0–34.0)
MCHC: 32.6 g/dL (ref 30.0–36.0)
MCV: 92.1 fL (ref 80.0–100.0)
Platelets: 426 K/uL — ABNORMAL HIGH (ref 150–400)
RBC: 3.69 MIL/uL — ABNORMAL LOW (ref 3.87–5.11)
RDW: 12.6 % (ref 11.5–15.5)
WBC: 11.5 K/uL — ABNORMAL HIGH (ref 4.0–10.5)
nRBC: 0 % (ref 0.0–0.2)

## 2024-03-23 LAB — POCT ACTIVATED CLOTTING TIME
Activated Clotting Time: 147 s
Activated Clotting Time: 233 s
Activated Clotting Time: 250 s

## 2024-03-23 LAB — TYPE AND SCREEN
ABO/RH(D): B NEG
Antibody Screen: NEGATIVE

## 2024-03-23 LAB — GLUCOSE, CAPILLARY
Glucose-Capillary: 113 mg/dL — ABNORMAL HIGH (ref 70–99)
Glucose-Capillary: 308 mg/dL — ABNORMAL HIGH (ref 70–99)
Glucose-Capillary: 252 mg/dL — ABNORMAL HIGH (ref 70–99)
Glucose-Capillary: 215 mg/dL — ABNORMAL HIGH (ref 70–99)
Glucose-Capillary: 177 mg/dL — ABNORMAL HIGH (ref 70–99)
Glucose-Capillary: 154 mg/dL — ABNORMAL HIGH (ref 70–99)
Glucose-Capillary: 178 mg/dL — ABNORMAL HIGH (ref 70–99)
Glucose-Capillary: 229 mg/dL — ABNORMAL HIGH (ref 70–99)
Glucose-Capillary: 243 mg/dL — ABNORMAL HIGH (ref 70–99)

## 2024-03-23 LAB — PROTIME-INR
INR: 1 (ref 0.8–1.2)
Prothrombin Time: 13.9 s (ref 11.4–15.2)

## 2024-03-23 LAB — APTT: aPTT: 36 s (ref 24–36)

## 2024-03-23 SURGERY — CREATION, BYPASS, ARTERIAL, FEMORAL TO PERONEAL, USING GRAFT
Anesthesia: General | Site: Leg Lower | Laterality: Left

## 2024-03-23 MED ORDER — HEPARIN 6000 UNIT IRRIGATION SOLUTION
Status: DC | PRN
Start: 2024-03-23 — End: 2024-03-23
  Administered 2024-03-23: 1

## 2024-03-23 MED ORDER — HEPARIN 6000 UNIT IRRIGATION SOLUTION
Status: AC
  Filled 2024-03-23: qty 500

## 2024-03-23 MED ORDER — ROCURONIUM BROMIDE 10 MG/ML (PF) SYRINGE
PREFILLED_SYRINGE | INTRAVENOUS | Status: DC | PRN
  Administered 2024-03-23 (×3): 40 mg via INTRAVENOUS
  Administered 2024-03-23: 20 mg via INTRAVENOUS

## 2024-03-23 MED ORDER — LABETALOL HCL 5 MG/ML IV SOLN
INTRAVENOUS | Status: DC | PRN
  Administered 2024-03-23 (×3): 5 mg via INTRAVENOUS

## 2024-03-23 MED ORDER — DROPERIDOL 2.5 MG/ML IJ SOLN: 0.6250 mg | Freq: Once | INTRAMUSCULAR | Status: DC | PRN

## 2024-03-23 MED ORDER — CEFAZOLIN SODIUM-DEXTROSE 2-4 GM/100ML-% IV SOLN
2.0000 g | INTRAVENOUS | Status: AC
  Administered 2024-03-23: 2 g via INTRAVENOUS
  Filled 2024-03-23: qty 100

## 2024-03-23 MED ORDER — PHENYLEPHRINE HCL-NACL 20-0.9 MG/250ML-% IV SOLN
INTRAVENOUS | Status: DC | PRN
  Administered 2024-03-23: 40 ug/min via INTRAVENOUS

## 2024-03-23 MED ORDER — PROTAMINE SULFATE 10 MG/ML IV SOLN
INTRAVENOUS | Status: AC
Start: 2024-03-23 — End: 2024-03-23
  Filled 2024-03-23: qty 5

## 2024-03-23 MED ORDER — CHLORHEXIDINE GLUCONATE 0.12 % MT SOLN
15.0000 mL | Freq: Once | OROMUCOSAL | Status: AC
  Administered 2024-03-23: 15 mL via OROMUCOSAL

## 2024-03-23 MED ORDER — PROTAMINE SULFATE 10 MG/ML IV SOLN
INTRAVENOUS | Status: DC | PRN
Start: 2024-03-23 — End: 2024-03-23
  Administered 2024-03-23: 50 mg via INTRAVENOUS

## 2024-03-23 MED ORDER — ROCURONIUM BROMIDE 10 MG/ML (PF) SYRINGE
PREFILLED_SYRINGE | INTRAVENOUS | Status: AC
  Filled 2024-03-23: qty 10

## 2024-03-23 MED ORDER — ONDANSETRON HCL 4 MG/2ML IJ SOLN
INTRAMUSCULAR | Status: AC
  Filled 2024-03-23: qty 2

## 2024-03-23 MED ORDER — ONDANSETRON HCL 4 MG/2ML IJ SOLN
INTRAMUSCULAR | Status: DC | PRN
  Administered 2024-03-23: 4 mg via INTRAVENOUS

## 2024-03-23 MED ORDER — DEXAMETHASONE SOD PHOSPHATE PF 10 MG/ML IJ SOLN
INTRAMUSCULAR | Status: DC | PRN
  Administered 2024-03-23: 5 mg via INTRAVENOUS

## 2024-03-23 MED ORDER — CHLORHEXIDINE GLUCONATE CLOTH 2 % EX PADS
6.0000 | MEDICATED_PAD | Freq: Once | CUTANEOUS | Status: AC
  Administered 2024-03-23: 6 via TOPICAL

## 2024-03-23 MED ORDER — SODIUM CHLORIDE 0.9 % IV SOLN: 250.0000 mL | INTRAVENOUS | Status: AC | PRN

## 2024-03-23 MED ORDER — LACTATED RINGERS IV SOLN: INTRAVENOUS | Status: DC

## 2024-03-23 MED ORDER — HEMOSTATIC AGENTS (NO CHARGE) OPTIME
TOPICAL | Status: DC | PRN
  Administered 2024-03-23: 1 via TOPICAL
  Administered 2024-03-23: 2 via TOPICAL

## 2024-03-23 MED ORDER — POTASSIUM CHLORIDE CRYS ER 20 MEQ PO TBCR
40.0000 meq | EXTENDED_RELEASE_TABLET | Freq: Every day | ORAL | Status: DC | PRN
  Administered 2024-03-26 – 2024-03-28 (×2): 40 meq via ORAL
  Filled 2024-03-23 (×2): qty 2

## 2024-03-23 MED ORDER — HEPARIN SODIUM (PORCINE) 1000 UNIT/ML IJ SOLN
INTRAMUSCULAR | Status: DC | PRN
  Administered 2024-03-23: 7000 [IU] via INTRAVENOUS
  Administered 2024-03-23: 2000 [IU] via INTRAVENOUS

## 2024-03-23 MED ORDER — CEFAZOLIN SODIUM-DEXTROSE 2-4 GM/100ML-% IV SOLN
2.0000 g | Freq: Three times a day (TID) | INTRAVENOUS | Status: AC
  Administered 2024-03-23 (×2): 2 g via INTRAVENOUS
  Filled 2024-03-23 (×2): qty 100

## 2024-03-23 MED ORDER — PHENYLEPHRINE 80 MCG/ML (10ML) SYRINGE FOR IV PUSH (FOR BLOOD PRESSURE SUPPORT)
PREFILLED_SYRINGE | INTRAVENOUS | Status: AC
  Filled 2024-03-23: qty 10

## 2024-03-23 MED ORDER — PROPOFOL 500 MG/50ML IV EMUL
INTRAVENOUS | Status: DC | PRN
  Administered 2024-03-23: 100 mg via INTRAVENOUS
  Administered 2024-03-23: 40 ug/kg/min via INTRAVENOUS

## 2024-03-23 MED ORDER — 0.9 % SODIUM CHLORIDE (POUR BTL) OPTIME
TOPICAL | Status: DC | PRN
  Administered 2024-03-23: 2000 mL

## 2024-03-23 MED ORDER — LIDOCAINE 2% (20 MG/ML) 5 ML SYRINGE
INTRAMUSCULAR | Status: AC
  Filled 2024-03-23: qty 5

## 2024-03-23 MED ORDER — SCOPOLAMINE 1 MG/3DAYS TD PT72
MEDICATED_PATCH | TRANSDERMAL | Status: AC
  Filled 2024-03-23: qty 1

## 2024-03-23 MED ORDER — ORAL CARE MOUTH RINSE: 15.0000 mL | Freq: Once | OROMUCOSAL | Status: AC

## 2024-03-23 MED ORDER — MEPERIDINE HCL 50 MG/ML IJ SOLN
6.2500 mg | INTRAMUSCULAR | Status: DC | PRN
  Filled 2024-03-23: qty 1

## 2024-03-23 MED ORDER — FENTANYL CITRATE (PF) 250 MCG/5ML IJ SOLN
INTRAMUSCULAR | Status: AC
  Filled 2024-03-23: qty 5

## 2024-03-23 MED ORDER — SODIUM CHLORIDE 0.9% FLUSH
3.0000 mL | Freq: Two times a day (BID) | INTRAVENOUS | Status: DC
  Administered 2024-03-23 – 2024-03-28 (×8): 3 mL via INTRAVENOUS

## 2024-03-23 MED ORDER — FENTANYL CITRATE (PF) 100 MCG/2ML IJ SOLN: 25.0000 ug | INTRAMUSCULAR | Status: DC | PRN

## 2024-03-23 MED ORDER — SODIUM CHLORIDE 0.9 % IV SOLN
INTRAVENOUS | Status: DC
  Administered 2024-03-23: 10 mL/h via INTRAVENOUS

## 2024-03-23 MED ORDER — SCOPOLAMINE 1 MG/3DAYS TD PT72
MEDICATED_PATCH | TRANSDERMAL | Status: DC | PRN
  Administered 2024-03-23: 1 via TRANSDERMAL

## 2024-03-23 MED ORDER — SODIUM CHLORIDE 0.9 % IV SOLN: 500.0000 mL | Freq: Once | INTRAVENOUS | Status: DC | PRN

## 2024-03-23 MED ORDER — FENTANYL CITRATE (PF) 250 MCG/5ML IJ SOLN
INTRAMUSCULAR | Status: DC | PRN
  Administered 2024-03-23: 100 ug via INTRAVENOUS
  Administered 2024-03-23 (×2): 50 ug via INTRAVENOUS

## 2024-03-23 MED ORDER — PROPOFOL 10 MG/ML IV BOLUS
INTRAVENOUS | Status: AC
  Filled 2024-03-23: qty 20

## 2024-03-23 MED ORDER — LIDOCAINE HCL (CARDIAC) PF 100 MG/5ML IV SOSY
PREFILLED_SYRINGE | INTRAVENOUS | Status: DC | PRN
  Administered 2024-03-23: 40 mg via INTRAVENOUS

## 2024-03-23 MED ORDER — ARTIFICIAL TEARS OPHTHALMIC OINT
TOPICAL_OINTMENT | OPHTHALMIC | Status: DC | PRN
  Administered 2024-03-23: 1 via OPHTHALMIC

## 2024-03-23 MED ORDER — OXYCODONE HCL 5 MG/5ML PO SOLN: 5.0000 mg | Freq: Once | ORAL | Status: DC | PRN

## 2024-03-23 MED ORDER — INSULIN ASPART 100 UNIT/ML IJ SOLN
0.0000 [IU] | INTRAMUSCULAR | Status: DC | PRN
  Administered 2024-03-23: 2 [IU] via SUBCUTANEOUS
  Filled 2024-03-23: qty 1

## 2024-03-23 MED ORDER — SUGAMMADEX SODIUM 200 MG/2ML IV SOLN
INTRAVENOUS | Status: DC | PRN
  Administered 2024-03-23: 141 mg via INTRAVENOUS

## 2024-03-23 MED ORDER — SODIUM CHLORIDE 0.9% FLUSH
3.0000 mL | INTRAVENOUS | Status: DC | PRN
Start: 2024-03-23 — End: 2024-03-28
  Administered 2024-03-28: 3 mL via INTRAVENOUS

## 2024-03-23 MED ORDER — ACETAMINOPHEN 10 MG/ML IV SOLN: 1000.0000 mg | Freq: Once | INTRAVENOUS | Status: DC | PRN

## 2024-03-23 MED ORDER — OXYCODONE HCL 5 MG PO TABS: 5.0000 mg | ORAL_TABLET | Freq: Once | ORAL | Status: DC | PRN

## 2024-03-23 MED ORDER — PHENYLEPHRINE 80 MCG/ML (10ML) SYRINGE FOR IV PUSH (FOR BLOOD PRESSURE SUPPORT)
PREFILLED_SYRINGE | INTRAVENOUS | Status: DC | PRN
  Administered 2024-03-23 (×2): 40 ug via INTRAVENOUS
  Administered 2024-03-23: 80 ug via INTRAVENOUS
  Administered 2024-03-23: 40 ug via INTRAVENOUS
  Administered 2024-03-23 (×5): 80 ug via INTRAVENOUS

## 2024-03-23 MED ORDER — SODIUM CHLORIDE 0.9 % IV SOLN: INTRAVENOUS | Status: DC | PRN

## 2024-03-23 MED ORDER — ARTIFICIAL TEARS OPHTHALMIC OINT
TOPICAL_OINTMENT | OPHTHALMIC | Status: AC
  Filled 2024-03-23: qty 3.5

## 2024-03-23 MED ORDER — HEPARIN SODIUM (PORCINE) 1000 UNIT/ML IJ SOLN
INTRAMUSCULAR | Status: AC
  Filled 2024-03-23: qty 10

## 2024-03-23 SURGICAL SUPPLY — 50 items
BAG COUNTER SPONGE SURGICOUNT (BAG) ×1 IMPLANT
BENZOIN TINCTURE PRP APPL 2/3 (GAUZE/BANDAGES/DRESSINGS) IMPLANT
BNDG COMPR ESMARK 6X3 LF (GAUZE/BANDAGES/DRESSINGS) IMPLANT
CANISTER SUCTION 3000ML PPV (SUCTIONS) ×1 IMPLANT
CANNULA VESSEL 3MM 2 BLNT TIP (CANNULA) IMPLANT
CHLORAPREP W/TINT 26 (MISCELLANEOUS) ×2 IMPLANT
CLIP LIGATING EXTRA MED SLVR (CLIP) IMPLANT
CLIP LIGATING EXTRA SM BLUE (MISCELLANEOUS) IMPLANT
CLSR STERI-STRIP ANTIMIC 1/2X4 (GAUZE/BANDAGES/DRESSINGS) IMPLANT
CUFF TOURN SGL QUICK 42 (TOURNIQUET CUFF) IMPLANT
CUFF TRNQT CYL 24X4X16.5-23 (TOURNIQUET CUFF) IMPLANT
CUFF TRNQT CYL 34X4.125X (TOURNIQUET CUFF) IMPLANT
DRAPE C-ARM 42X72 X-RAY (DRAPES) IMPLANT
DRAPE HALF SHEET 40X57 (DRAPES) IMPLANT
DRESSING PEEL AND PLC PRVNA 13 (GAUZE/BANDAGES/DRESSINGS) IMPLANT
ELECTRODE REM PT RTRN 9FT ADLT (ELECTROSURGICAL) ×1 IMPLANT
EVACUATOR SILICONE 100CC (DRAIN) IMPLANT
GAUZE SPONGE 4X4 12PLY STRL (GAUZE/BANDAGES/DRESSINGS) ×1 IMPLANT
GLOVE BIO SURGEON STRL SZ7 (GLOVE) IMPLANT
GLOVE BIO SURGEON STRL SZ8 (GLOVE) ×3 IMPLANT
GOWN STRL REUS W/ TWL LRG LVL3 (GOWN DISPOSABLE) ×2 IMPLANT
GOWN STRL REUS W/ TWL XL LVL3 (GOWN DISPOSABLE) ×1 IMPLANT
GRAFT PROPATEN W/RING 6X80X60 (Vascular Products) IMPLANT
HEMOSTAT SNOW SURGICEL 2X4 (HEMOSTASIS) IMPLANT
INSERT FOGARTY SM (MISCELLANEOUS) IMPLANT
KIT BASIN OR (CUSTOM PROCEDURE TRAY) ×1 IMPLANT
KIT DRSG PREVENA PLUS 7DAY 125 (MISCELLANEOUS) IMPLANT
KIT TURNOVER KIT B (KITS) ×1 IMPLANT
MARKER GRAFT CORONARY BYPASS (MISCELLANEOUS) IMPLANT
PACK PERIPHERAL VASCULAR (CUSTOM PROCEDURE TRAY) ×1 IMPLANT
PAD ARMBOARD POSITIONER FOAM (MISCELLANEOUS) ×2 IMPLANT
SET COLLECT BLD 21X3/4 12 (NEEDLE) IMPLANT
SET WALTER ACTIVATION W/DRAPE (SET/KITS/TRAYS/PACK) IMPLANT
SOLN 0.9% NACL POUR BTL 1000ML (IV SOLUTION) ×2 IMPLANT
SOLN STERILE WATER BTL 1000 ML (IV SOLUTION) ×1 IMPLANT
STOPCOCK 4 WAY LG BORE MALE ST (IV SETS) IMPLANT
STRIP CLOSURE SKIN 1/2X4 (GAUZE/BANDAGES/DRESSINGS) IMPLANT
SUT ETHILON 3 0 PS 1 (SUTURE) IMPLANT
SUT MNCRL AB 4-0 PS2 18 (SUTURE) ×2 IMPLANT
SUT PROLENE 5 0 C 1 24 (SUTURE) ×1 IMPLANT
SUT PROLENE 6 0 BV (SUTURE) ×1 IMPLANT
SUT PROLENE 7 0 BV1 MDA (SUTURE) IMPLANT
SUT SILK 2 0 SH (SUTURE) ×1 IMPLANT
SUT SILK 3-0 18XBRD TIE 12 (SUTURE) IMPLANT
SUT VIC AB 2-0 CT1 TAPERPNT 27 (SUTURE) ×2 IMPLANT
SUT VIC AB 3-0 SH 27X BRD (SUTURE) ×2 IMPLANT
TOWEL GREEN STERILE (TOWEL DISPOSABLE) ×1 IMPLANT
TRAY FOLEY MTR SLVR 16FR STAT (SET/KITS/TRAYS/PACK) ×1 IMPLANT
TUBE CONNECTING 20X1/4 (TUBING) IMPLANT
UNDERPAD 30X36 HEAVY ABSORB (UNDERPADS AND DIAPERS) ×1 IMPLANT

## 2024-03-23 NOTE — Transfer of Care (Signed)
 Immediate Anesthesia Transfer of Care Note  Patient: Katie Hawkins  Procedure(s) Performed: CREATION, BYPASS, ARTERIAL, COMMON FEMORAL TO PERONEAL, USING GORTEX GRAFT (Left: Leg Lower)  Patient Location: PACU  Anesthesia Type:General  Level of Consciousness: awake, alert , and oriented  Airway & Oxygen Therapy: Patient Spontanous Breathing  Post-op Assessment: Report given to RN, Post -op Vital signs reviewed and stable, Patient moving all extremities X 4, and Patient able to stick tongue midline  Post vital signs: Reviewed and stable  Last Vitals:  Vitals Value Taken Time  BP 139/57 03/23/24 10:55  Temp 98.6   Pulse 90 03/23/24 10:59  Resp 12 03/23/24 10:59  SpO2 95 % 03/23/24 10:59  Vitals shown include unfiled device data.  Last Pain:  Vitals:   03/23/24 0659  TempSrc:   PainSc: 0-No pain         Complications: No notable events documented.

## 2024-03-23 NOTE — Progress Notes (Signed)
 [facility] [place] PROGRESS NOTE  ASSESSMENT / PLAN: Katie HERNAN is a 73 y.o. 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 She has angiogram showing reconstitution of a peroneal target. Plan LCFA-peroneal bypass with PTFE today in OR. Reviewed risks / benefits / alternatives with patient in detail. We made a plan to monitor the foot as it looks less inflamed with antibiotics.   SUBJECTIVE: No distress. Reviewed OR plan.   OBJECTIVE: BP 114/79 (BP Location: Left Arm)   Pulse 95   Temp 98 F (36.7 C) (Oral)   Resp 20   SpO2 97%   Intake/Output Summary (Last 24 hours) at 03/23/2024 0725 Last data filed at 03/22/2024 2251 Gross per 24 hour  Intake 243 ml  Output --  Net 243 ml    No distress Regular rate and rhythm Unlabored breathing L foot CLTI     Latest Ref Rng & Units 03/23/2024    3:14 AM 03/21/2024    4:09 PM 03/11/2024    3:18 AM  CBC  WBC 4.0 - 10.5 K/uL 11.5  11.3  10.9   Hemoglobin 12.0 - 15.0 g/dL 88.8  89.2  88.8   Hematocrit 36.0 - 46.0 % 34.0  32.2  34.1   Platelets 150 - 400 K/uL 426  415  327         Latest Ref Rng & Units 03/23/2024    3:14 AM 03/21/2024    4:09 PM 03/11/2024    3:18 AM  CMP  Glucose 70 - 99 mg/dL 759  730  869   BUN 8 - 23 mg/dL 11  16  8    Creatinine 0.44 - 1.00 mg/dL 9.30  9.28  9.48   Sodium 135 - 145 mmol/L 134  135  137   Potassium 3.5 - 5.1 mmol/L 4.4  4.5  3.4   Chloride 98 - 111 mmol/L 97  98  101   CO2 22 - 32 mmol/L 28  26  25    Calcium  8.9 - 10.3 mg/dL 9.2  8.8  8.6   Total Protein 6.5 - 8.1 g/dL 6.5  6.2    Total Bilirubin 0.0 - 1.2 mg/dL 0.4  0.4    Alkaline Phos 38 - 126 U/L 52  56    AST 15 - 41 U/L 17  16    ALT 0 - 44 U/L 18  19     Katalia Choma N. Magda, MD [facility] Phone Number: [phone] 03/23/2024 7:25 AM

## 2024-03-23 NOTE — Anesthesia Procedure Notes (Signed)
 Procedure Name: Intubation Date/Time: 03/23/2024 7:49 AM  Performed by: Harrold Macintosh, CRNAPre-anesthesia Checklist: Patient identified, Emergency Drugs available, Suction available and Patient being monitored Patient Re-evaluated:Patient Re-evaluated prior to induction Oxygen Delivery Method: Circle system utilized Preoxygenation: Pre-oxygenation with 100% oxygen Induction Type: IV induction Ventilation: Mask ventilation without difficulty Laryngoscope Size: Miller and 2 Grade View: Grade I Tube type: Oral Tube size: 7.0 mm Number of attempts: 1 Airway Equipment and Method: Stylet and Bite block Placement Confirmation: ETT inserted through vocal cords under direct vision, positive ETCO2 and breath sounds checked- equal and bilateral Secured at: 21 cm Tube secured with: Tape Dental Injury: Teeth and Oropharynx as per pre-operative assessment

## 2024-03-23 NOTE — Discharge Instructions (Addendum)
 [facility]  Discharge instructions  Lower Extremity Bypass Surgery  Please refer to the following instruction for your post-procedure care. Your surgeon or physician assistant will discuss any changes with you.  Activity  You are encouraged to walk as much as you can. You can slowly return to normal activities during the month after your surgery. Avoid strenuous activity and heavy lifting until your doctor tells you it's OK. Avoid activities such as vacuuming or swinging a golf club. Do not drive until your doctor give the OK and you are no longer taking prescription pain medications. It is also normal to have difficulty with sleep habits, eating and bowel movement after surgery. These will go away with time.  Bathing/Showering  Shower daily after you go home. Do not soak in a bathtub, hot tub, or swim until the incision heals completely.  Incision Care  Clean your incision with mild soap and water. Shower every day. Pat the area dry with a clean towel. You do not need a bandage unless otherwise instructed. Do not apply any ointments or creams to your incision. If you have open wounds you will be instructed how to care for them or a visiting nurse may be arranged for you. If you have staples or sutures along your incision they will be removed at your post-op appointment. You may have skin glue on your incision. Do not peel it off. It will come off on its own in about one week.  Keep Pravena wound vac on your groin incision until it loses it seal in about 7-10 days.  Once that happens, you can remove and then wash the groin wound with soap and water daily and pat dry. (No tub bath-only shower)  Then put a clean, dry gauze or washcloth in the groin to keep this area dry to help prevent wound infection.  Do this daily and as needed.  Do not use Vaseline or neosporin on your incisions.  Only use soap and water on your incisions and then protect and keep  dry.   Diet  Resume your normal diet. There are no special food restrictions following this procedure. A low fat/ low cholesterol diet is recommended for all patients with vascular disease. In order to heal from your surgery, it is CRITICAL to get adequate nutrition. Your body requires vitamins, minerals, and protein. Vegetables are the best source of vitamins and minerals. Vegetables also provide the perfect balance of protein. Processed food has little nutritional value, so try to avoid this.  Medications  Resume taking all your medications unless your doctor or physician assistant tells you not to. If your incision is causing pain, you may take over-the-counter pain relievers such as acetaminophen  (Tylenol ). If you were prescribed a stronger pain medication, please aware these medication can cause nausea and constipation. Prevent nausea by taking the medication with a snack or meal. Avoid constipation by drinking plenty of fluids and eating foods with high amount of fiber, such as fruits, vegetables, and grains. Take Colace 100 mg (an over-the-counter stool softener) twice a day as needed for constipation.  Do not take Tylenol  if you are taking prescription pain medications.  Follow Up  Our office will schedule a follow up appointment 2-3 weeks following discharge.  Please call us  immediately for any of the following conditions  Severe or worsening pain in your legs or feet while at rest or while walking Increase pain, redness, warmth, or drainage (pus) from your incision site(s) Fever of  101 degree or higher The swelling in your leg with the bypass suddenly worsens and becomes more painful than when you were in the hospital If you have been instructed to feel your graft pulse then you should do so every day. If you can no longer feel this pulse, call the office immediately. Not all patients are given this instruction.  Leg swelling is common after leg bypass surgery.  The swelling should  improve over a few months following surgery. To improve the swelling, you may elevate your legs above the level of your heart while you are sitting or resting. Your surgeon or physician assistant may ask you to apply an ACE wrap or wear compression (TED) stockings to help to reduce swelling.  Reduce your risk of vascular disease  Stop smoking. If you would like help call [facility] at 1-800-QUIT-NOW (801-589-6086) or [place] at [phone].  Manage your cholesterol Maintain a desired weight Control your diabetes weight Control your diabetes Keep your blood pressure down  If you have any questions, please call the office at [phone]

## 2024-03-23 NOTE — Anesthesia Postprocedure Evaluation (Signed)
 Anesthesia Post Note  Patient: BRENDALYN VALLELY  Procedure(s) Performed: CREATION, BYPASS, ARTERIAL, COMMON FEMORAL TO PERONEAL, USING GORTEX GRAFT (Left: Leg Lower)     Patient location during evaluation: PACU Anesthesia Type: General Level of consciousness: awake and alert Pain management: pain level controlled Vital Signs Assessment: post-procedure vital signs reviewed and stable Respiratory status: spontaneous breathing, nonlabored ventilation, respiratory function stable and patient connected to nasal cannula oxygen Cardiovascular status: blood pressure returned to baseline and stable Postop Assessment: no apparent nausea or vomiting Anesthetic complications: no   No notable events documented.  Last Vitals:  Vitals:   03/23/24 1130 03/23/24 1319  BP: (!) 130/54 119/64  Pulse: 89 89  Resp: 12   Temp: 36.8 C   SpO2: 100% 98%    Last Pain:  Vitals:   03/23/24 1130  TempSrc:   PainSc: Asleep                 Franky JONETTA Bald

## 2024-03-23 NOTE — Progress Notes (Signed)
   03/23/24 1444  TOC Brief Assessment  Insurance and Status Reviewed  Patient has primary care physician Yes  Home environment has been reviewed home w/ spouse  Prior level of function: self  Prior/Current Home Services No current home services  Social Drivers of Health Review SDOH reviewed no interventions necessary  Readmission risk has been reviewed Yes  Transition of care needs no transition of care needs at this time    OR today for vascular bypass, PT/OT evals pending, Inpatient Care Management (ICM) has reviewed patient and will continue to monitor patient advancement through interdisciplinary progression rounds. If new patient transition needs arise, please place a ICM (CM/CSW) consult.

## 2024-03-23 NOTE — Op Note (Signed)
 DATE OF SERVICE: 03/23/2024  PATIENT:  Katie Hawkins  73 y.o. female  PRE-OPERATIVE DIAGNOSIS:  chronic limb threatening ischemia of left lower extremity  POST-OPERATIVE DIAGNOSIS:  Same  PROCEDURE:   Left common femoral to peroneal artery bypass with 6mm ePTFE in subfascial tunnel (CPT [phone])  SURGEON:  Surgeons and Role:    * Magda Debby SAILOR, MD - Primary  ASSISTANT: Curry Damme, PA-C  An experienced assistant was required given the complexity of this procedure and the standard of surgical care. My assistant helped with exposure through counter tension, suctioning, ligation and retraction to better visualize the surgical field.  My assistant expedited sewing during the case by following my sutures. Wherever I use the term we in the report, my assistant actively helped me with that portion of the procedure.  ANESTHESIA:   general  EBL:  BLOOD ADMINISTERED:none  DRAINS: none   LOCAL MEDICATIONS USED:  NONE  SPECIMEN:  none  COUNTS: confirmed correct.  TOURNIQUET:  none  PATIENT DISPOSITION:  PACU - hemodynamically stable.   Delay start of Pharmacological VTE agent (>24hrs) due to surgical blood loss or risk of bleeding: no  INDICATION FOR PROCEDURE: Katie SANTOYO is a 73 y.o. female with left leg first toe gangrene and angiogram showing popliteal occlusion with reconstitution of peroneal artery. After careful discussion of risks, benefits, and alternatives the patient was offered left femoral peroneal bypass. The patient understood and wished to proceed.  OPERATIVE FINDINGS: successful L CFA-peroneal bypass with PTFE. Brisk doppler flow in distal peroneal which was graft dependent.   DESCRIPTION OF PROCEDURE: After identification of the patient in the pre-operative holding area, the patient was transferred to the operating room. The patient was positioned supine on the operating room table. Anesthesia was induced. The left leg was prepped and draped in  standard fashion. A surgical pause was performed confirming correct patient, procedure, and operative location.  Scar on the left groin was reopened sharply with a 10 blade.  Incision was carried down to the subcutaneous tissue with Bovie electrocautery until the femoral sheath was encountered.  This was entered carefully.  Some scar was noted.  We are able to expose the head of the prior bypass which was widely patent.  I exposed 270 degrees of the bypass hood and was able to slot a Cooley clamp into position to control the anterior surface of the prior anastomosis.  Scar in the left medial calf was reopened sharply with a 10 blade.  The incision was carried down to the subcutaneous tissue.  Scar was encountered and divided with Bovie electrocautery.  I was able to enter an anatomic plane and identify both the posterior tibial and the peroneal artery.  Identified the prior bypass and found a healthy segment of peroneal artery below it.  Several centimeters of this artery were skeletonized to allow application of bulldog clamps later in the case.  A subfascial tunnel was created connecting the 2 exposures with a Kelly-Wick tunneler.  6 mm externally supported PTFE vascular graft was delivered through the sheath of the tunneler taking great care to avoid twisting or kinking the graft.  The sheath was removed.  The patient was heparinized with 7000 units of IV heparin .  Activated clotting time measurements were used throughout the case to confirm adequate anticoagulation.  A Cooley clamp was applied to the common femoral artery.  An anterior arteriotomy was made with 11 blade and extended with Potts scissors.  The proximal end of the vascular  graft was spatulated to allow end-to-side anastomosis to the arteriotomy.  This anastomosis was performed with continuous running suture of 5-0 Prolene.  Prior to completion the anastomosis was flushed and de-aired down the open end of the vascular graft.  The anastomosis  was completed.  Reasonable hemostasis was achieved with some typical needle hole bleeding seen in Gore-Tex graft.  The wound was packed and attention turned to the distal anastomosis.  Bulldog clamps were applied to the peroneal artery proximally and distally to our proposed bypass target.  The leg was extended and the graft fit down onto the artery so that it set without undue tension or redundancy.  The distal graft was then spatulated to allow end-to-side anastomosis to the peroneal artery.  Peroneal arteriotomy was then made with an 11 blade and extended with Potts scissors.  The anastomosis was then performed using continuous running suture 6-0 Prolene.  Prior to completion the anastomosis was flushed and de-aired.  The anastomosis was completed.  Clamps were released on the bypass graft and the native peroneal artery.  Palpable pulse was achieved in the peroneal artery distal to the anastomosis.  Brisk Doppler flow was heard at the ankle and the peroneal artery.  This was graft dependent.  Satisfied being the case here.  Heparin  was reversed with protamine .  Hemostasis was again confirmed in the anastomoses and the surgical beds.  The wounds were closed in layers using 2-0 Vicryl, 3-0 Vicryl, 4-0 Monocryl.  A Prevena was applied to the groin given her pannus and redo anatomy.  Simple bandage was applied to the left calf.  Upon completion of the case instrument and sharps counts were confirmed correct. The patient was transferred to the PACU in good condition. I was present for all portions of the procedure.  FOLLOW UP PLAN: Assuming a normal postoperative course, I will see the patient in 4 weeks with ABI and left lower extremity arterial duplex.  Patient should continue aspirin  therapy.  We will start DOAC therapy once hemoglobin is stable.  Continue statin therapies.  Mobilize as able.  Will take a watchful waiting approach to be gangrene of the left foot.  High likelihood she will need a toe  amputation, but we will monitor this.  Debby SAILOR. Magda, MD [facility] Phone Number: [phone] 03/23/2024 11:13 AM

## 2024-03-23 NOTE — Anesthesia Preprocedure Evaluation (Addendum)
 Anesthesia Evaluation  Patient identified by MRN, date of birth, ID band Patient awake    Reviewed: Allergy & Precautions, NPO status , Patient's Chart, lab work & pertinent test results  Airway Mallampati: II  TM Distance: >3 FB Neck ROM: Full    Dental  (+) Teeth Intact, Dental Advisory Given, Poor Dentition   Pulmonary neg pulmonary ROS   breath sounds clear to auscultation       Cardiovascular hypertension, Pt. on medications and Pt. on home beta blockers + Peripheral Vascular Disease   Rhythm:Regular Rate:Normal     Neuro/Psych negative neurological ROS  negative psych ROS   GI/Hepatic negative GI ROS, Neg liver ROS,,,  Endo/Other  diabetes, Type 2, Oral Hypoglycemic Agents    Renal/GU negative Renal ROS     Musculoskeletal negative musculoskeletal ROS (+)    Abdominal   Peds  Hematology negative hematology ROS (+)   Anesthesia Other Findings   Reproductive/Obstetrics                              Anesthesia Physical Anesthesia Plan  ASA: 3  Anesthesia Plan: General   Post-op Pain Management: Tylenol  PO (pre-op )*   Induction: Intravenous  PONV Risk Score and Plan: 4 or greater and Ondansetron , Dexamethasone , Midazolam  and Scopolamine patch - Pre-op   Airway Management Planned: Oral ETT  Additional Equipment: Arterial line  Intra-op Plan:   Post-operative Plan: Extubation in OR  Informed Consent: I have reviewed the patients History and Physical, chart, labs and discussed the procedure including the risks, benefits and alternatives for the proposed anesthesia with the patient or authorized representative who has indicated his/her understanding and acceptance.     Dental advisory given  Plan Discussed with: CRNA  Anesthesia Plan Comments:          Anesthesia Quick Evaluation

## 2024-03-23 NOTE — Anesthesia Procedure Notes (Addendum)
 Arterial Line Insertion Start/End10/29/2025 7:07 AM, 03/23/2024 7:10 AM Performed by: Tilford Franky BIRCH, MD, anesthesiologist  Patient location: Pre-op . Preanesthetic checklist: patient identified, IV checked, site marked, risks and benefits discussed, surgical consent, monitors and equipment checked, pre-op  evaluation, timeout performed and anesthesia consent Lidocaine  1% used for infiltration Right, radial was placed Catheter size: 20 G Hand hygiene performed , maximum sterile barriers used  and Seldinger technique used Allen's test indicative of satisfactory collateral circulation Attempts: 2 Following insertion, Biopatch and dressing applied. Post procedure assessment: normal and unchanged  Post procedure complications: second provider assisted. Patient tolerated the procedure well with no immediate complications. Additional procedure comments: 1st attempt by CRNA w/ultrasound guidance, unable to thread catheter; resistance observed. Dr. Tilford notified.SABRA

## 2024-03-24 ENCOUNTER — Telehealth (HOSPITAL_COMMUNITY): Payer: Self-pay | Admitting: Pharmacy Technician

## 2024-03-24 ENCOUNTER — Other Ambulatory Visit (HOSPITAL_COMMUNITY): Payer: Self-pay

## 2024-03-24 ENCOUNTER — Encounter (HOSPITAL_COMMUNITY): Payer: Self-pay | Admitting: Vascular Surgery

## 2024-03-24 DIAGNOSIS — I70229 Atherosclerosis of native arteries of extremities with rest pain, unspecified extremity: Secondary | ICD-10-CM | POA: Diagnosis not present

## 2024-03-24 LAB — GLUCOSE, CAPILLARY
Glucose-Capillary: 184 mg/dL — ABNORMAL HIGH (ref 70–99)
Glucose-Capillary: 291 mg/dL — ABNORMAL HIGH (ref 70–99)
Glucose-Capillary: 160 mg/dL — ABNORMAL HIGH (ref 70–99)
Glucose-Capillary: 123 mg/dL — ABNORMAL HIGH (ref 70–99)

## 2024-03-24 LAB — LIPID PANEL
Cholesterol: 80 mg/dL (ref 0–200)
HDL: 34 mg/dL — ABNORMAL LOW (ref >40)
LDL Cholesterol: 26 mg/dL (ref 0–99)
Total CHOL/HDL Ratio: 2.4 ratio
Triglycerides: 100 mg/dL (ref <150)
VLDL: 20 mg/dL (ref 0–40)

## 2024-03-24 LAB — CBC
HCT: 29.9 % — ABNORMAL LOW (ref 36.0–46.0)
Hemoglobin: 10 g/dL — ABNORMAL LOW (ref 12.0–15.0)
MCH: 30.6 pg (ref 26.0–34.0)
MCHC: 33.4 g/dL (ref 30.0–36.0)
MCV: 91.4 fL (ref 80.0–100.0)
Platelets: 409 K/uL — ABNORMAL HIGH (ref 150–400)
RBC: 3.27 MIL/uL — ABNORMAL LOW (ref 3.87–5.11)
RDW: 12.9 % (ref 11.5–15.5)
WBC: 12.7 K/uL — ABNORMAL HIGH (ref 4.0–10.5)
nRBC: 0 % (ref 0.0–0.2)

## 2024-03-24 LAB — BASIC METABOLIC PANEL WITH GFR
Anion gap: 14 (ref 5–15)
BUN: 8 mg/dL (ref 8–23)
CO2: 23 mmol/L (ref 22–32)
Calcium: 8.3 mg/dL — ABNORMAL LOW (ref 8.9–10.3)
Chloride: 97 mmol/L — ABNORMAL LOW (ref 98–111)
Creatinine, Ser: 0.61 mg/dL (ref 0.44–1.00)
GFR, Estimated: >60 mL/min (ref >60)
Glucose, Bld: 177 mg/dL — ABNORMAL HIGH (ref 70–99)
Potassium: 3.6 mmol/L (ref 3.5–5.1)
Sodium: 134 mmol/L — ABNORMAL LOW (ref 135–145)

## 2024-03-24 MED ORDER — OXYCODONE-ACETAMINOPHEN 5-325 MG PO TABS
1.0000 | ORAL_TABLET | Freq: Four times a day (QID) | ORAL | 0 refills | Status: AC | PRN
  Filled 2024-03-24: qty 12, 3d supply, fill #0

## 2024-03-24 MED ORDER — ORAL CARE MOUTH RINSE: 15.0000 mL | OROMUCOSAL | Status: DC | PRN

## 2024-03-24 MED ORDER — CLOPIDOGREL BISULFATE 75 MG PO TABS
75.0000 mg | ORAL_TABLET | Freq: Every day | ORAL | Status: DC
  Administered 2024-03-24 – 2024-03-28 (×5): 75 mg via ORAL
  Filled 2024-03-24 (×5): qty 1

## 2024-03-24 MED ORDER — RIVAROXABAN 2.5 MG PO TABS
2.5000 mg | ORAL_TABLET | Freq: Two times a day (BID) | ORAL | Status: DC
  Administered 2024-03-24 – 2024-03-28 (×7): 2.5 mg via ORAL
  Filled 2024-03-24 (×9): qty 1

## 2024-03-24 MED ORDER — DOXYCYCLINE HYCLATE 100 MG PO TABS
100.0000 mg | ORAL_TABLET | Freq: Two times a day (BID) | ORAL | 0 refills | Status: DC
  Filled 2024-03-24: qty 14, 7d supply, fill #0

## 2024-03-24 MED ORDER — RIVAROXABAN 2.5 MG PO TABS
2.5000 mg | ORAL_TABLET | Freq: Two times a day (BID) | ORAL | 0 refills | Status: DC
  Filled 2024-03-24: qty 60, 30d supply, fill #0

## 2024-03-24 MED ORDER — DOXYCYCLINE HYCLATE 100 MG PO TABS
100.0000 mg | ORAL_TABLET | Freq: Two times a day (BID) | ORAL | Status: DC
Start: 2024-03-24 — End: 2024-03-28
  Administered 2024-03-24 – 2024-03-28 (×8): 100 mg via ORAL
  Filled 2024-03-24 (×9): qty 1

## 2024-03-24 NOTE — Progress Notes (Addendum)
  Progress Note    03/24/2024 8:10 AM 1 Day Post-Op  Subjective:  wants to go home   Vitals:   03/24/24 0335 03/24/24 0505  BP: 107/63   Pulse: (!) 104 (!) 110  Resp: 20 (!) 25  Temp: 99.2 F (37.3 C)   SpO2: 91% 95%   Physical Exam: Lungs:  non labored Incisions:  L groin with prevena, good seal; L calf incision c/d/i Extremities:  brisk L peroneal and PT by doppler Neurologic: A&O  CBC    Component Value Date/Time   WBC 12.7 (H) 03/24/2024 0331   RBC 3.27 (L) 03/24/2024 0331   HGB 10.0 (L) 03/24/2024 0331   HGB 13.4 02/20/2014 1159   HCT 29.9 (L) 03/24/2024 0331   HCT 41.0 02/20/2014 1159   PLT 409 (H) 03/24/2024 0331   PLT 318 02/20/2014 1159   MCV 91.4 03/24/2024 0331   MCV 89.8 02/20/2014 1159   MCH 30.6 03/24/2024 0331   MCHC 33.4 03/24/2024 0331   RDW 12.9 03/24/2024 0331   RDW 13.6 02/20/2014 1159   LYMPHSABS 4.2 (H) 03/10/2024 0921   LYMPHSABS 1.7 02/20/2014 1159   MONOABS 1.4 (H) 03/10/2024 0921   MONOABS 0.8 02/20/2014 1159   EOSABS 0.1 03/10/2024 0921   EOSABS 0.1 02/20/2014 1159   BASOSABS 0.1 03/10/2024 0921   BASOSABS 0.1 02/20/2014 1159    BMET    Component Value Date/Time   NA 134 (L) 03/24/2024 0331   NA 140 02/20/2014 1159   K 3.6 03/24/2024 0331   K 3.7 02/20/2014 1159   CL 97 (L) 03/24/2024 0331   CL 103 08/11/2012 0838   CO2 23 03/24/2024 0331   CO2 27 02/20/2014 1159   GLUCOSE 177 (H) 03/24/2024 0331   GLUCOSE 176 (H) 02/20/2014 1159   GLUCOSE 165 (H) 08/11/2012 0838   BUN 8 03/24/2024 0331   BUN 16.6 02/20/2014 1159   CREATININE 0.61 03/24/2024 0331   CREATININE 0.7 02/20/2014 1159   CALCIUM  8.3 (L) 03/24/2024 0331   CALCIUM  10.0 02/20/2014 1159   GFRNONAA >60 03/24/2024 0331   GFRAA >60 11/22/2010 1515    INR    Component Value Date/Time   INR 1.0 03/23/2024 0314     Intake/Output Summary (Last 24 hours) at 03/24/2024 0810 Last data filed at 03/24/2024 0500 Gross per 24 hour  Intake 2278.32 ml  Output  2650 ml  Net -371.68 ml     Assessment/Plan:  73 y.o. female is s/p L femoral to peroneal bypass with PTFE 1 Day Post-Op   L foot warm and well perfused with brisk PT and peroneal signals by doppler Incisions are well appearing; continue prevena vac in groin for 1 week OOB with therapy teams this morning Continue aspirin , statin daily    Donnice Sender, PA-C [facility] [phone] 03/24/2024 8:10 AM  VASCULAR STAFF ADDENDUM: I have independently interviewed and examined the patient. I agree with the above.  Looks good with brisk doppler flow s/p femoral - peroneal bypass yesterday. Patient wants to go home. This is fine from my standpoint. Recommend ASA + DOAC at discharge. Eliquis  reportedly prohibitively expensive. Will ask TOC to evaluate Xarelto.  Follow up with me in 4 weeks with ABI and LLE arterial duplex  Debby SAILOR. Magda, MD [facility] Phone Number: [phone] 03/24/2024 1:14 PM

## 2024-03-24 NOTE — Evaluation (Signed)
 Physical Therapy Evaluation Patient Details Name: Katie Hawkins MRN: [id number] DOB: [birth year] Today's Date: 03/24/2024  History of Present Illness  73 y.o. 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 underwent L femoral to peroneal bypass with PTFE. Past medical history significant of PAD, limb ischemia, chronic foot ulcer, obesity, hypertension, hyperlipidemia, diabetes, Hodgkin lymphoma.   Clinical Impression  Patient is s/p above surgery resulting in functional limitations due to the deficits listed below (see PT Problem List). Entered room to patient standing nude, tangled in lines/leads/tubing, having dislodged her Rt antecubital IV. (RN notified and assessed in room.) Pt noted to be Oriented to time, place, self, and situation but mistook her wound vac for her cat Katie Hawkins a couple of times. Impulsive with reduced safety awareness. Needed re-oriented to task at hand several times and disregarding instructions to remain seated while being untangled from lines and re-dressed. She was adamant that she would not use a RW for stability and she required up to min assist to mobilize safely, demonstrating reduced balance, antalgic patterning, and reduced stance time on Lt with wide BOS. Highly suggest 24/7 supervision at d/c due to fall risk and reduced safety awareness. RN notified - requested OT visit for further assessment. Will continue to follow and progress during acute admission. Patient will benefit from acute skilled PT to increase their independence and safety with mobility to facilitate discharge.         If plan is discharge home, recommend the following: A little help with walking and/or transfers;A little help with bathing/dressing/bathroom;Supervision due to cognitive status;Assistance with cooking/housework;Direct supervision/assist for medications management;Direct supervision/assist for financial management;Assist for transportation   Can travel by  private vehicle        Equipment Recommendations Rolling walker (2 wheels)  Recommendations for Other Services       Functional Status Assessment Patient has had a recent decline in their functional status and demonstrates the ability to make significant improvements in function in a reasonable and predictable amount of time.     Precautions / Restrictions Precautions Precautions: Fall Recall of Precautions/Restrictions: Impaired Restrictions Weight Bearing Restrictions Per Provider Order: No      Mobility  Bed Mobility Overal bed mobility: Needs Assistance Bed Mobility: Supine to Sit, Sit to Supine     Supine to sit: Contact guard Sit to supine: Contact guard assist   General bed mobility comments: CGA for safety. Needs cues and assist for Line/lead management, poor awareness. Requires extra time.    Transfers Overall transfer level: Needs assistance Equipment used: None Transfers: Sit to/from Stand Sit to Stand: Contact guard assist           General transfer comment: CGA for safety again with assist for line/lead management. Impulsive, performed several times from bed with cues to remain seated due to poor awareness of lines/leads, and wound vac. Pt refused RW.    Ambulation/Gait Ambulation/Gait assistance: Min assist Gait Distance (Feet): 55 Feet Assistive device: 1 person hand held assist, IV Pole Gait Pattern/deviations: Step-through pattern, Decreased stride length, Knee flexed in stance - left, Drifts right/left, Wide base of support, Antalgic Gait velocity: dec Gait velocity interpretation: <1.31 ft/sec, indicative of household ambulator   General Gait Details: Up to min assist for balance. Pt refuses RW use despite encouragement. Antalgic, wide BOS. Cues for awareness and techniques to improve stability and gait symmerty. Frequently reaching for rail, IV pole. Needs redirection.  Stairs  Wheelchair Mobility     Tilt Bed     Modified Rankin (Stroke Patients Only)       Balance Overall balance assessment: Needs assistance Sitting-balance support: No upper extremity supported, Feet supported Sitting balance-Katie Hawkins Scale: Good     Standing balance support: During functional activity, No upper extremity supported Standing balance-Katie Hawkins Scale: Fair Standing balance comment: Can stand without UE support.but increased instability noted                             Pertinent Vitals/Pain Pain Assessment Pain Assessment: 0-10 Pain Score: 1  Pain Location: Lt groin Pain Descriptors / Indicators: Operative site guarding Pain Intervention(s): Limited activity within patient's tolerance, Monitored during session, Repositioned    Home Living Family/patient expects to be discharged to:: Private residence Living Arrangements: Spouse/significant other Available Help at Discharge: Family;Available 24 hours/day Type of Home: House Home Access: Level entry       Home Layout: One level Home Equipment: Shower seat      Prior Function Prior Level of Function : Independent/Modified Independent;Driving;History of Falls (last six months)             Mobility Comments: ind, one fall in april ADLs Comments: ind     Extremity/Trunk Assessment   Upper Extremity Assessment Upper Extremity Assessment: Defer to OT evaluation    Lower Extremity Assessment Lower Extremity Assessment: Generalized weakness    Cervical / Trunk Assessment Cervical / Trunk Assessment: Normal  Communication   Communication Communication: No apparent difficulties    Cognition Arousal: Alert Behavior During Therapy: Impulsive, Restless   PT - Cognitive impairments: No family/caregiver present to determine baseline, Safety/Judgement, Problem solving, Attention, Memory, Awareness                       PT - Cognition Comments: When PT entered room, pt standing nude tangled in lines, leads, and wound vac.  Mistook her wound vac for her Charity Fundraiser. Following commands: Impaired Following commands impaired: Follows one step commands inconsistently     Cueing Cueing Techniques: Verbal cues, Gestural cues, Tactile cues     General Comments General comments (skin integrity, edema, etc.): HR 120s at rest, up to 137 with gait. Spo2 93% and greater on RA.    Exercises     Assessment/Plan    PT Assessment Patient needs continued PT services  PT Problem List Decreased strength;Decreased range of motion;Decreased activity tolerance;Decreased balance;Decreased mobility;Decreased cognition;Decreased coordination;Decreased knowledge of use of DME;Decreased safety awareness;Decreased knowledge of precautions;Cardiopulmonary status limiting activity;Pain       PT Treatment Interventions DME instruction;Gait training;Functional mobility training;Therapeutic activities;Therapeutic exercise;Balance training;Neuromuscular re-education;Cognitive remediation;Patient/family education;Manual techniques    PT Goals (Current goals can be found in the Care Plan section)  Acute Rehab PT Goals Patient Stated Goal: Go home today PT Goal Formulation: With patient Time For Goal Achievement: 04/07/24 Potential to Achieve Goals: Good    Frequency Min 2X/week     Co-evaluation               AM-PAC PT 6 Clicks Mobility  Outcome Measure Help needed turning from your back to your side while in a flat bed without using bedrails?: A Little Help needed moving from lying on your back to sitting on the side of a flat bed without using bedrails?: A Little Help needed moving to and from a bed to a chair (including a wheelchair)?: A Little Help needed standing up  from a chair using your arms (e.g., wheelchair or bedside chair)?: A Little Help needed to walk in hospital room?: A Little Help needed climbing 3-5 steps with a railing? : A Lot 6 Click Score: 17    End of Session Equipment Utilized During Treatment:  Gait belt Activity Tolerance: Patient tolerated treatment well Patient left: in bed;with call bell/phone within reach;with bed alarm set;with nursing/sitter in room Nurse Communication: Mobility status;Other (comment) (Confusion) PT Visit Diagnosis: Unsteadiness on feet (R26.81);Other abnormalities of gait and mobility (R26.89);Muscle weakness (generalized) (M62.81);History of falling (Z91.81);Difficulty in walking, not elsewhere classified (R26.2);Other symptoms and signs involving the nervous system (R29.898);Pain Pain - Right/Left: Left Pain - part of body: Leg    Time: 0802-0828 PT Time Calculation (min) (ACUTE ONLY): 26 min   Charges:   PT Evaluation $PT Eval Low Complexity: 1 Low PT Treatments $Therapeutic Activity: 8-22 mins PT General Charges $$ ACUTE PT VISIT: 1 Visit         Leontine Roads, PT, DPT [facility]  Rehabilitation Services Physical Therapist Office: [phone] Website: [email or web address]   Leontine GORMAN Roads 03/24/2024, 11:38 AM

## 2024-03-24 NOTE — TOC Transition Note (Signed)
 Transition of Care (TOC) - Discharge Note Rayfield Gobble RN, BSN Inpatient Care Management Unit 4E- RN Case Manager See Treatment Team for direct phone #   Patient Details  Name: Katie Hawkins MRN: [id number] Date of Birth: Sep 20, 1950  Transition of Care [place]) CM/SW Contact:  Gobble Rayfield Hurst, RN Phone Number: 03/24/2024, 4:39 PM   Clinical Narrative:    Consult received regarding cost for Eliquis - benefits check completed per Pharmacy- pt has high copay due to deductible not being met- out of pocket price is the same for both Eliquis  and Xarelto until deductible is met.   CM spoke with pt and spouse at bedside regarding HH and DME needs.  Per conversation they report that pt has rolling walker available to use at home- decline need for RW on discharge.  HH offered with list for choice Per CMS guidelines from phonefinancing.pl website with star ratings (copy placed in shadow chart)- pt states she does not feel she needs HH at this time, and spouse agrees that they can manage at home- pt/spouse decline [facility] referral on discharge- explained that they can f/u with PCP should pt change her mind and have a HH referral made.   No further IP CM needs noted.  Spouse to transport home   Final next level of care: Home/Self Care Barriers to Discharge: No Barriers Identified   Patient Goals and CMS Choice Patient states their goals for this hospitalization and ongoing recovery are:: return home   Choice offered to / list presented to : Patient, Spouse      Discharge Placement                 Home      Discharge Plan and Services Additional resources added to the After Visit Summary for     Discharge Planning Services: CM Consult Post Acute Care Choice: Durable Medical Equipment, Home Health          DME Arranged: Patient refused services, Walker rolling DME Agency: NA       HH Arranged: PT, OT, Patient Refused HH HH Agency: NA        Social Drivers of Health  (SDOH) Interventions SDOH Screenings   Food Insecurity: No Food Insecurity (03/23/2024)  Housing: Low Risk  (03/23/2024)  Transportation Needs: No Transportation Needs (03/23/2024)  Utilities: Not At Risk (03/23/2024)  Social Connections: Socially Integrated (03/23/2024)  Tobacco Use: Low Risk  (03/21/2024)     Readmission Risk Interventions    03/24/2024    4:39 PM  Readmission Risk Prevention Plan  Transportation Screening Complete  HRI or Home Care Consult Complete  Social Work Consult for Recovery Care Planning/Counseling Complete  Palliative Care Screening Not Applicable  Medication Review Oceanographer) Complete

## 2024-03-24 NOTE — Telephone Encounter (Signed)
 Patient Product/process Development Scientist completed.    The patient is insured through [employer]. Patient has Medicare and is not eligible for a copay card, but may be able to apply for patient assistance or Medicare RX Payment Plan (Patient Must reach out to their plan, if eligible for payment plan), if available.    Ran test claim for Eliquis  5 mg and the current 30 day co-pay is $281.38 due to a $250.00 deductible.  Ran test claim for Xarelto 20 mg and the current 30 day co-pay is $281.38 due to a $250.00 deductible.  This test claim was processed through [facility]- copay amounts may vary at other pharmacies due to pharmacy/plan contracts, or as the patient moves through the different stages of their insurance plan.     Katie Hawkins, CPHT Pharmacy Technician Patient Advocate Specialist Lead [facility] Patient Advocate Team Direct Number: [phone]  Fax: [phone]

## 2024-03-24 NOTE — Progress Notes (Signed)
 Progress Note   Patient: Katie Hawkins FMW:[id number] DOB: February 22, 1951 DOA: 03/21/2024     3 DOS: the patient was seen and examined on 03/24/2024   Brief hospital course: The patient is a 73 yr old woman who was admitted to [facility] by vascular surgery due to concern for critical limb ischemia on 03/21/2024. She went for a follow up visit with vascular surgery on 03/20/2024 after  being inpatient 10/16/-10/17 for ischemia and early osteomyelitis on MRI.  The ulcer had worsened. The plan is for the patient to undergo angiography on 03/23/2024.   On 03/23/2024 after the procedure the patient is seen in PACU. She underwent a left common femoral to peroneal artery bypass with a subfascial tunneling of 6 mm e PTFE She has tolerated the procedure well. Per vascular surgery the patient will be started on a DOAC once her hemoglobin is stable. She will continue ASA and statin therapies. Will movilize as possible. He will see the patient in 4 weeks with ABI and left lower extremity arterial duplex. Although vascular surgery is just watching the left foot at this point, there is a high likelihood for amputation of the great toe in the future.  Assessment and Plan: Critical limb ischemia PAD Ischemic foot ulcer > As per HPI and recent vascular surgery notes.  Patient with known extensive PAD presenting with worsening foot ulcer as direct admit from vascular surgery office for redo bypass to allow for healing of wound and healing of potential future amputation if needed. > History of left femoral to peroneal bypass in 2023 with endovascular intervention in 2024 and early 2025.  Subsequently developed silent occlusion noted on reevaluation in June 2025, asymptomatic at that time.  Patient seen earlier this month with evaluation by vascular surgery showing known occlusion of the bypass with occlusion of native left popliteal, AT, PT arteries.  Followed up outpatient for consideration of redo bypass. > As above, at  follow-up wound had worsened and patient was directly admitted for ischemia with plan for redo bypass. -Monitoring on progressive unit - Appreciate vascular surgery recommendations and assistance - Pt underwent Left common femoral to peroneal artery bypass with 6mm ePTFE in subfascial tunnel on 03/23/2024 - Continue with vancomycin and Zosyn as ordered - Hold Plavix  - Continue ASA - On heparin  - As needed pain medication - Continue atorvastatin, Zetia  - Restart eliquis  when hemoglobin stabilizes. - Monitor left foot fot now, but it is likely that the great toe will need to be amputated.   Hypertension - Continue lisinopril , metoprolol    Hyperlipidemia - Continue home atorvastatin, Zetia    Diabetes - SSI   History of Hodgkin's lymphoma - Noted   Obesity - Noted    Subjective: The patient is resting comfortably. She is very somnolent  Physical Exam: Vitals:   03/23/24 2011 03/23/24 2327 03/24/24 0335 03/24/24 0505  BP: (!) 90/42 106/88 107/63   Pulse: 88 (!) 102 (!) 104 (!) 110  Resp: 20 20 20  (!) 25  Temp: 99.2 F (37.3 C) 99.5 F (37.5 C) 99.2 F (37.3 C)   TempSrc: Oral Oral Oral   SpO2: 98% 97% 91% 95%  Weight:    75.6 kg   Exam:  Constitutional:  The patient is very somnolent. No acute distress. Respiratory:  No increased work of breathing. No wheezes, rales, or rhonchi No tactile fremitus Cardiovascular:  Regular rate and rhythm No murmurs, ectopy, or gallups. No lateral PMI. No thrills. Abdomen:  Abdomen is soft, non-tender, non-distended No hernias, masses,  or organomegaly Normoactive bowel sounds.  Musculoskeletal:  No cyanosis, clubbing, or edema Significant ulceration of the left great toe with swelling and erythema. Left lower extremity and foot are bandaged. Skin:  No rashes, lesions, ulcers palpation of skin: no induration or nodules Neurologic:  CN 2-12 intact Sensation all 4 extremities intact Psychiatric:  Mental status Mood,  affect appropriate Orientation to person, place, time  judgment and insight appear intact  Data Reviewed:  CBC, BMP  Family Communication: None available  Disposition: Status is: Inpatient Remains inpatient appropriate because: Duet to limb ischemia.  Planned Discharge Destination: TBD  Time spent: 32 minutes  Author: Tachina Spoonemore, DO 03/23/2024 1232 PM  For on call review [email or web address].

## 2024-03-24 NOTE — Evaluation (Signed)
 Occupational Therapy Evaluation Patient Details Name: Katie Hawkins MRN: [id number] DOB: 07/18/1950 Today's Date: 03/24/2024   History of Present Illness   73 y.o. 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 underwent L femoral to peroneal bypass with PTFE. Past medical history significant of PAD, limb ischemia, chronic foot ulcer, obesity, hypertension, hyperlipidemia, diabetes, Hodgkin lymphoma.     Clinical Impressions Pt admitted based on above, and was seen based on problem list below. PTA pt was independent with ADLs and IADLs. Today pt is requiring set up  to min  assist for ADLs. Functional transfers require up to mod assist at times. Pt limited by poor standing balance and cog. Husband verbalizing cog concerns as well, pt with poor organzing, sequencing, at times disorganized and tangential. Pt with poor insight into deficits and safety awareness. Pt able to navigate a clear pathway, but navigating small spaces or crowded room, pt is requiring min to mod assist for managing RW and balance. Pt would benefit from HHOT at d/c. Recommending shower seat and RW at d/c. Encouraged husband to supervise mobility at all times d/t deficits above. OT will continue to follow acutely to maximize functional independence.        If plan is discharge home, recommend the following:   A little help with walking and/or transfers;A little help with bathing/dressing/bathroom;Assistance with cooking/housework;Supervision due to cognitive status     Functional Status Assessment   Patient has had a recent decline in their functional status and demonstrates the ability to make significant improvements in function in a reasonable and predictable amount of time.     Equipment Recommendations   Tub/shower seat;Other (comment) (RW)      Precautions/Restrictions   Precautions Precautions: Fall Recall of Precautions/Restrictions: Impaired Restrictions Weight  Bearing Restrictions Per Provider Order: No     Mobility Bed Mobility Overal bed mobility: Needs Assistance Bed Mobility: Supine to Sit, Sit to Supine     Supine to sit: Min assist Sit to supine: Contact guard assist, Used rails, HOB elevated   General bed mobility comments: Min HH assist for trunk to come to sit. CGA for returning to bed, assist to monitor lines    Transfers Overall transfer level: Needs assistance Equipment used: Rolling walker (2 wheels) Transfers: Sit to/from Stand Sit to Stand: Min assist           General transfer comment: inital STS without AD min assist to steady. once with RW pt improved. navigated clear pathway in hall at [facility]. navigating room and bathroom was min to mod assist. Heavy cueing and assist for managing RW      Balance Overall balance assessment: Needs assistance Sitting-balance support: No upper extremity supported, Feet supported Sitting balance-Leahy Scale: Good     Standing balance support: Bilateral upper extremity supported, During functional activity, Reliant on assistive device for balance Standing balance-Leahy Scale: Poor Standing balance comment: Reliant on RW       ADL either performed or assessed with clinical judgement   ADL Overall ADL's : Needs assistance/impaired Eating/Feeding: Set up;Sitting   Grooming: Wash/dry hands;Minimal assistance;Standing Grooming Details (indicate cue type and reason): Min assist to steady in standing         Upper Body Dressing : Set up;Sitting   Lower Body Dressing: Minimal assistance;Sit to/from stand Lower Body Dressing Details (indicate cue type and reason): Assist to steady in standing Toilet Transfer: Rolling walker (2 wheels);Minimal assistance;Ambulation;Regular Teacher, Adult Education Details (indicate cue type and reason): Assist  for balance and navigating bathroom Toileting- Clothing Manipulation and Hygiene: Contact guard assist;Sitting/lateral lean Toileting -  Clothing Manipulation Details (indicate cue type and reason): hygiene in sitting     Functional mobility during ADLs: Minimal assistance;Moderate assistance;Rolling walker (2 wheels) General ADL Comments: Poor standing balance, cog potentially limiting ability to navigate environment     Vision Baseline Vision/History: 0 No visual deficits Additional Comments: Pt with poor ability to navigate objects in space, may be d/t cog but would benefit from further monitoring            Pertinent Vitals/Pain Pain Assessment Pain Assessment: No/denies pain Pain Intervention(s): Monitored during session     Extremity/Trunk Assessment Upper Extremity Assessment Upper Extremity Assessment: Generalized weakness   Lower Extremity Assessment Lower Extremity Assessment: Defer to PT evaluation   Cervical / Trunk Assessment Cervical / Trunk Assessment: Normal   Communication Communication Communication: No apparent difficulties   Cognition Arousal: Alert Behavior During Therapy: Impulsive, Restless Cognition: Cognition impaired     Awareness: Online awareness impaired   Attention impairment (select first level of impairment): Sustained attention, Selective attention Executive functioning impairment (select all impairments): Organization, Sequencing, Reasoning, Problem solving OT - Cognition Comments: Poor ability to follow commands. Aware of surgery and to monitor incision. No insight into balance deficits, or safety awareness                 Following commands: Impaired Following commands impaired: Follows one step commands inconsistently     Cueing  General Comments   Cueing Techniques: Verbal cues;Gestural cues;Tactile cues  VSS on RA           Home Living Family/patient expects to be discharged to:: Private residence Living Arrangements: Spouse/significant other Available Help at Discharge: Family;Available 24 hours/day Type of Home: House Home Access: Level  entry     Home Layout: One level     Bathroom Shower/Tub: Producer, Television/film/video: Handicapped height Bathroom Accessibility: Yes   Home Equipment: Shower seat          Prior Functioning/Environment Prior Level of Function : Independent/Modified Independent;Driving;History of Falls (last six months)             Mobility Comments: ind, one fall in april ADLs Comments: ind    OT Problem List: Decreased strength;Decreased activity tolerance;Decreased range of motion;Impaired balance (sitting and/or standing);Decreased knowledge of use of DME or AE;Decreased safety awareness;Cardiopulmonary status limiting activity   OT Treatment/Interventions: Self-care/ADL training;Therapeutic exercise;Energy conservation;DME and/or AE instruction;Therapeutic activities;Patient/family education;Balance training;Visual/perceptual remediation/compensation      OT Goals(Current goals can be found in the care plan section)   Acute Rehab OT Goals Patient Stated Goal: to go home OT Goal Formulation: With patient Time For Goal Achievement: 04/07/24 Potential to Achieve Goals: Good   OT Frequency:  Min 2X/week       AM-PAC OT 6 Clicks Daily Activity     Outcome Measure Help from another person eating meals?: None Help from another person taking care of personal grooming?: A Little Help from another person toileting, which includes using toliet, bedpan, or urinal?: A Little Help from another person bathing (including washing, rinsing, drying)?: A Little Help from another person to put on and taking off regular upper body clothing?: A Little Help from another person to put on and taking off regular lower body clothing?: A Little 6 Click Score: 19   End of Session Equipment Utilized During Treatment: Rolling walker (2 wheels) Nurse Communication: Mobility status  Activity Tolerance: Patient  tolerated treatment well Patient left: in bed;with call bell/phone within reach;with  bed alarm set;with family/visitor present  OT Visit Diagnosis: Unsteadiness on feet (R26.81);Other abnormalities of gait and mobility (R26.89);Muscle weakness (generalized) (M62.81)                Time: 9062-8987 OT Time Calculation (min): 35 min Charges:  OT General Charges $OT Visit: 1 Visit OT Evaluation $OT Eval Moderate Complexity: 1 Mod OT Treatments $Self Care/Home Management : 8-22 mins  Adrianne BROCKS, OT  Acute Rehabilitation Services Office [phone] Secure chat preferred   Adrianne GORMAN Savers 03/24/2024, 10:36 AM

## 2024-03-24 NOTE — Progress Notes (Signed)
 Progress Note   Patient: Katie Hawkins FMW:[id number] DOB: [birth year] DOA: 03/21/2024     3 DOS: the patient was seen and examined on 03/24/2024   Brief hospital course: The patient is a 73 yr old woman who was admitted to [facility] by vascular surgery due to concern for critical limb ischemia on 03/21/2024. She went for a follow up visit with vascular surgery on 03/20/2024 after  being inpatient 10/16/-10/17 for ischemia and early osteomyelitis on MRI.  The ulcer had worsened. The plan is for the patient to undergo angiography on 03/23/2024.   Assessment and Plan: Critical limb ischemia PAD Ischemic foot ulcer > As per HPI and recent vascular surgery notes.  Patient with known extensive PAD presenting with worsening foot ulcer as direct admit from vascular surgery office for redo bypass to allow for healing of wound and healing of potential future amputation if needed. > History of left femoral to peroneal bypass in 2023 with endovascular intervention in 2024 and early 2025.  Subsequently developed silent occlusion noted on reevaluation in June 2025, asymptomatic at that time.  Patient seen earlier this month with evaluation by vascular surgery showing known occlusion of the bypass with occlusion of native left popliteal, AT, PT arteries.  Followed up outpatient for consideration of redo bypass. > As above, at follow-up wound had worsened and patient was directly admitted for ischemia with plan for redo bypass. -Monitoring on progressive unit - Appreciate vascular surgery recommendations and assistance - Intervention anticipated 10/29. - Continue with vancomycin and Zosyn as ordered - Hold Plavix  - Continue ASA - On heparin  - As needed pain medication - Continue atorvastatin, Zetia    Hypertension - Continue lisinopril , metoprolol    Hyperlipidemia - Continue home atorvastatin, Zetia    Diabetes - SSI   History of Hodgkin's lymphoma - Noted   Obesity - Noted       Subjective:  The patient is resting comfortably. No new complaints.   Physical Exam: Vitals:   03/23/24 2011 03/23/24 2327 03/24/24 0335 03/24/24 0505  BP: (!) 90/42 106/88 107/63   Pulse: 88 (!) 102 (!) 104 (!) 110  Resp: 20 20 20  (!) 25  Temp: 99.2 F (37.3 C) 99.5 F (37.5 C) 99.2 F (37.3 C)   TempSrc: Oral Oral Oral   SpO2: 98% 97% 91% 95%  Weight:    75.6 kg   Exam:  Constitutional:  The patient is awake, alert, and oriented x 3. No acute distress. Eyes:  pupils and irises appear normal Normal lids and conjunctivae ENMT:  grossly normal hearing  Lips appear normal external ears, nose appear normal Oropharynx: mucosa, tongue,posterior pharynx appear normal Neck:  neck appears normal, no masses, normal ROM, supple no thyromegaly Respiratory:  No increased work of breathing. No wheezes, rales, or rhonchi No tactile fremitus Cardiovascular:  Regular rate and rhythm No murmurs, ectopy, or gallups. No lateral PMI. No thrills. Abdomen:  Abdomen is soft, non-tender, non-distended No hernias, masses, or organomegaly Normoactive bowel sounds.  Musculoskeletal:  No cyanosis, clubbing, or edema Significant ulceration of the left great toe with swelling and erythema. Skin:  No rashes, lesions, ulcers palpation of skin: no induration or nodules Neurologic:  CN 2-12 intact Sensation all 4 extremities intact Psychiatric:  Mental status Mood, affect appropriate Orientation to person, place, time  judgment and insight appear intact  Data Reviewed:  CBC, BMP  Family Communication: None available  Disposition: Status is: Inpatient Remains inpatient appropriate because: Duet to limb ischemia.  Planned Discharge Destination: TBD  Time spent: 32 minutes  Author: Tyara Dassow, DO 03/22/2024 3:30 PM  For on call review [email or web address].

## 2024-03-24 NOTE — Progress Notes (Signed)
 Progress Note   Patient: Katie Hawkins FMW:[id number] DOB: [birth year] DOA: 03/21/2024     3 DOS: the patient was seen and examined on 03/24/2024   Brief hospital course: The patient is a 73 yr old woman who was admitted to [facility] by vascular surgery due to concern for critical limb ischemia on 03/21/2024. She went for a follow up visit with vascular surgery on 03/20/2024 after  being inpatient 10/16/-10/17 for ischemia and early osteomyelitis on MRI.  The ulcer had worsened. The plan is for the patient to undergo angiography on 03/23/2024.   On 03/23/2024 after the procedure the patient is seen in PACU. She underwent a left common femoral to peroneal artery bypass with a subfascial tunneling of 6 mm e PTFE She has tolerated the procedure well. Per vascular surgery the patient will be started on a DOAC once her hemoglobin is stable. She will continue ASA and statin therapies. Will movilize as possible. He will see the patient in 4 weeks with ABI and left lower extremity arterial duplex. Although vascular surgery is just watching the left foot at this point, there is a high likelihood for amputation of the great toe in the future.  Assessment and Plan: Critical limb ischemia PAD Ischemic foot ulcer > As per HPI and recent vascular surgery notes.  Patient with known extensive PAD presenting with worsening foot ulcer as direct admit from vascular surgery office for redo bypass to allow for healing of wound and healing of potential future amputation if needed. > History of left femoral to peroneal bypass in 2023 with endovascular intervention in 2024 and early 2025.  Subsequently developed silent occlusion noted on reevaluation in June 2025, asymptomatic at that time.  Patient seen earlier this month with evaluation by vascular surgery showing known occlusion of the bypass with occlusion of native left popliteal, AT, PT arteries.  Followed up outpatient for consideration of redo bypass. > As above, at  follow-up wound had worsened and patient was directly admitted for ischemia with plan for redo bypass. -Monitoring on progressive unit - Appreciate vascular surgery recommendations and assistance - Pt underwent Left common femoral to peroneal artery bypass with 6mm ePTFE in subfascial tunnel on 03/23/2024 - Continue with vancomycin and Zosyn as ordered - Hold Plavix  - Continue ASA - On heparin  - As needed pain medication - Continue atorvastatin, Zetia  - Restart eliquis  when hemoglobin stabilizes. - Monitor left foot fot now, but it is likely that the great toe will need to be amputated.   Hypertension - Continue lisinopril , metoprolol    Hyperlipidemia - Continue home atorvastatin, Zetia    Diabetes - SSI   History of Hodgkin's lymphoma - Noted   Obesity - Noted  {Tip this will not be part of the note when signed Body mass index is 29.52 kg/m. , ,  (Optional):26781}  Subjective: The patient is resting comfortably. She is very somnolent  Physical Exam: Vitals:   03/24/24 0505 03/24/24 0832 03/24/24 1243 03/24/24 1813  BP:  97/73 (!) 93/54 90/63  Pulse: (!) 110 (!) 113 (!) 109 100  Resp: (!) 25 19 20 19   Temp:  97.9 F (36.6 C) 99.1 F (37.3 C) 98.4 F (36.9 C)  TempSrc:  Oral Oral Oral  SpO2: 95% 92% 95% 93%  Weight: 75.6 kg      Exam:  Constitutional:  The patient is very somnolent. No acute distress. Respiratory:  No increased work of breathing. No wheezes, rales, or rhonchi No tactile fremitus Cardiovascular:  Regular rate and rhythm  No murmurs, ectopy, or gallups. No lateral PMI. No thrills. Abdomen:  Abdomen is soft, non-tender, non-distended No hernias, masses, or organomegaly Normoactive bowel sounds.  Musculoskeletal:  No cyanosis, clubbing, or edema Significant ulceration of the left great toe with swelling and erythema. Left lower extremity and foot are bandaged. Skin:  No rashes, lesions, ulcers palpation of skin: no induration or  nodules Neurologic:  CN 2-12 intact Sensation all 4 extremities intact Psychiatric:  Mental status Mood, affect appropriate Orientation to person, place, time  judgment and insight appear intact  Data Reviewed:  CBC, BMP  Family Communication: None available  Disposition: Status is: Inpatient Remains inpatient appropriate because: Duet to limb ischemia.  Planned Discharge Destination: TBD{Tip this will not be part of the note when signed  DVT Prophylaxis  .Rivaroxaban (xarelto) tablet 2.5 mg , Scds  Scd's  (Optional):26781}   Time spent: 32 minutes  Author: Shareena Nusz, DO 03/23/2024 1232 PM  For on call review [email or web address].

## 2024-03-24 NOTE — Plan of Care (Signed)

## 2024-03-24 NOTE — Progress Notes (Signed)
 Mobility Specialist Progress Note:   03/24/24 1459  Mobility  Activity Ambulated with assistance  Level of Assistance Minimal assist, patient does 75% or more  Assistive Device Front wheel walker  Distance Ambulated (ft) 55 ft  Activity Response Tolerated well  Mobility Referral Yes  Mobility visit 1 Mobility  Mobility Specialist Start Time (ACUTE ONLY) 1445  Mobility Specialist Stop Time (ACUTE ONLY) 1459  Mobility Specialist Time Calculation (min) (ACUTE ONLY) 14 min   Pt received in bed, agreeable to mobility session. MinA required to stand with RW. Tolerated well, required gait belt for safety. LOB x1, corrected with gait belt. Pt slightly impulsive and confused throughout session. Max HR 145 bpm. Returned to room, pt eager for d/c. RN notified. Left with all needs met.   Jolan Upchurch Mobility Specialist Please contact via Special Educational Needs Teacher or  Rehab office at [phone]

## 2024-03-25 ENCOUNTER — Encounter (HOSPITAL_COMMUNITY): Payer: Self-pay | Admitting: Surgery

## 2024-03-25 ENCOUNTER — Other Ambulatory Visit (HOSPITAL_COMMUNITY): Payer: Self-pay

## 2024-03-25 ENCOUNTER — Other Ambulatory Visit: Payer: Self-pay

## 2024-03-25 ENCOUNTER — Encounter (HOSPITAL_COMMUNITY)
Admission: RE | Disposition: A | Discharge status: Home / Self Care | Payer: Self-pay | Source: Home / Self Care | Attending: Internal Medicine

## 2024-03-25 ENCOUNTER — Inpatient Hospital Stay (HOSPITAL_COMMUNITY)

## 2024-03-25 DIAGNOSIS — I10 Essential (primary) hypertension: Secondary | ICD-10-CM | POA: Diagnosis not present

## 2024-03-25 DIAGNOSIS — M869 Osteomyelitis, unspecified: Secondary | ICD-10-CM

## 2024-03-25 DIAGNOSIS — E1169 Type 2 diabetes mellitus with other specified complication: Secondary | ICD-10-CM | POA: Diagnosis not present

## 2024-03-25 DIAGNOSIS — I739 Peripheral vascular disease, unspecified: Secondary | ICD-10-CM

## 2024-03-25 DIAGNOSIS — I70229 Atherosclerosis of native arteries of extremities with rest pain, unspecified extremity: Secondary | ICD-10-CM | POA: Diagnosis not present

## 2024-03-25 HISTORY — PX: AMPUTATION: SHX166

## 2024-03-25 LAB — GLUCOSE, CAPILLARY
Glucose-Capillary: 199 mg/dL — ABNORMAL HIGH (ref 70–99)
Glucose-Capillary: 172 mg/dL — ABNORMAL HIGH (ref 70–99)
Glucose-Capillary: 178 mg/dL — ABNORMAL HIGH (ref 70–99)
Glucose-Capillary: 178 mg/dL — ABNORMAL HIGH (ref 70–99)
Glucose-Capillary: 203 mg/dL — ABNORMAL HIGH (ref 70–99)
Glucose-Capillary: 146 mg/dL — ABNORMAL HIGH (ref 70–99)
Glucose-Capillary: 155 mg/dL — ABNORMAL HIGH (ref 70–99)

## 2024-03-25 LAB — BASIC METABOLIC PANEL WITH GFR
Anion gap: 15 (ref 5–15)
BUN: 9 mg/dL (ref 8–23)
CO2: 24 mmol/L (ref 22–32)
Calcium: 8.9 mg/dL (ref 8.9–10.3)
Chloride: 94 mmol/L — ABNORMAL LOW (ref 98–111)
Creatinine, Ser: 0.66 mg/dL (ref 0.44–1.00)
GFR, Estimated: >60 mL/min (ref >60)
Glucose, Bld: 198 mg/dL — ABNORMAL HIGH (ref 70–99)
Potassium: 3.2 mmol/L — ABNORMAL LOW (ref 3.5–5.1)
Sodium: 133 mmol/L — ABNORMAL LOW (ref 135–145)

## 2024-03-25 LAB — CBC
HCT: 32.6 % — ABNORMAL LOW (ref 36.0–46.0)
Hemoglobin: 10.5 g/dL — ABNORMAL LOW (ref 12.0–15.0)
MCH: 29.8 pg (ref 26.0–34.0)
MCHC: 32.2 g/dL (ref 30.0–36.0)
MCV: 92.6 fL (ref 80.0–100.0)
Platelets: 391 K/uL (ref 150–400)
RBC: 3.52 MIL/uL — ABNORMAL LOW (ref 3.87–5.11)
RDW: 13 % (ref 11.5–15.5)
WBC: 16.9 K/uL — ABNORMAL HIGH (ref 4.0–10.5)
nRBC: 0 % (ref 0.0–0.2)

## 2024-03-25 SURGERY — AMPUTATION, FOOT, RAY
Anesthesia: Monitor Anesthesia Care | Site: Toe | Laterality: Left

## 2024-03-25 MED ORDER — FENTANYL CITRATE (PF) 100 MCG/2ML IJ SOLN: 25.0000 ug | INTRAMUSCULAR | Status: DC | PRN

## 2024-03-25 MED ORDER — LIDOCAINE HCL (PF) 1 % IJ SOLN
INTRAMUSCULAR | Status: AC
  Filled 2024-03-25: qty 30

## 2024-03-25 MED ORDER — POTASSIUM CHLORIDE 20 MEQ PO PACK
20.0000 meq | PACK | Freq: Two times a day (BID) | ORAL | Status: AC
  Administered 2024-03-26 (×2): 20 meq via ORAL
  Filled 2024-03-25 (×2): qty 1

## 2024-03-25 MED ORDER — LIDOCAINE 2% (20 MG/ML) 5 ML SYRINGE
INTRAMUSCULAR | Status: AC
  Filled 2024-03-25: qty 5

## 2024-03-25 MED ORDER — LACTATED RINGERS IV SOLN: INTRAVENOUS | Status: DC

## 2024-03-25 MED ORDER — INSULIN ASPART 100 UNIT/ML IJ SOLN: 0.0000 [IU] | INTRAMUSCULAR | Status: DC | PRN

## 2024-03-25 MED ORDER — STERILE WATER FOR INJECTION IJ SOLN
INTRAMUSCULAR | Status: AC
  Filled 2024-03-25: qty 10

## 2024-03-25 MED ORDER — PROPOFOL 10 MG/ML IV BOLUS
INTRAVENOUS | Status: DC | PRN
  Administered 2024-03-25: 20 mg via INTRAVENOUS
  Administered 2024-03-25: 30 mg via INTRAVENOUS

## 2024-03-25 MED ORDER — OXYCODONE HCL 5 MG/5ML PO SOLN: 5.0000 mg | Freq: Once | ORAL | Status: DC | PRN

## 2024-03-25 MED ORDER — ONDANSETRON HCL 4 MG/2ML IJ SOLN: 4.0000 mg | Freq: Once | INTRAMUSCULAR | Status: DC | PRN

## 2024-03-25 MED ORDER — CHLORHEXIDINE GLUCONATE 0.12 % MT SOLN
OROMUCOSAL | Status: AC
  Filled 2024-03-25: qty 15

## 2024-03-25 MED ORDER — BUPIVACAINE HCL (PF) 0.5 % IJ SOLN
INTRAMUSCULAR | Status: AC
  Filled 2024-03-25: qty 30

## 2024-03-25 MED ORDER — ONDANSETRON HCL 4 MG/2ML IJ SOLN
INTRAMUSCULAR | Status: AC
  Filled 2024-03-25: qty 2

## 2024-03-25 MED ORDER — LACTATED RINGERS IV SOLN: INTRAVENOUS | Status: DC | PRN

## 2024-03-25 MED ORDER — ORAL CARE MOUTH RINSE: 15.0000 mL | Freq: Once | OROMUCOSAL | Status: DC

## 2024-03-25 MED ORDER — PROPOFOL 500 MG/50ML IV EMUL
INTRAVENOUS | Status: DC | PRN
Start: 2024-03-25 — End: 2024-03-25
  Administered 2024-03-25: 80 ug/kg/min via INTRAVENOUS

## 2024-03-25 MED ORDER — ACETAMINOPHEN 10 MG/ML IV SOLN: 1000.0000 mg | Freq: Once | INTRAVENOUS | Status: DC | PRN

## 2024-03-25 MED ORDER — INSULIN ASPART 100 UNIT/ML IJ SOLN
2.0000 [IU] | Freq: Once | INTRAMUSCULAR | Status: AC
  Administered 2024-03-25: 2 [IU] via SUBCUTANEOUS

## 2024-03-25 MED ORDER — FENTANYL CITRATE (PF) 100 MCG/2ML IJ SOLN
INTRAMUSCULAR | Status: AC
  Filled 2024-03-25: qty 2

## 2024-03-25 MED ORDER — FENTANYL CITRATE (PF) 100 MCG/2ML IJ SOLN
INTRAMUSCULAR | Status: DC | PRN
  Administered 2024-03-25: 50 ug via INTRAVENOUS

## 2024-03-25 MED ORDER — CHLORHEXIDINE GLUCONATE 0.12 % MT SOLN: 15.0000 mL | Freq: Once | OROMUCOSAL | Status: DC

## 2024-03-25 MED ORDER — POTASSIUM CHLORIDE CRYS ER 20 MEQ PO TBCR
40.0000 meq | EXTENDED_RELEASE_TABLET | Freq: Once | ORAL | Status: AC
  Administered 2024-03-25: 40 meq via ORAL
  Filled 2024-03-25: qty 2

## 2024-03-25 MED ORDER — LIDOCAINE 2% (20 MG/ML) 5 ML SYRINGE
INTRAMUSCULAR | Status: DC | PRN
Start: 2024-03-25 — End: 2024-03-25
  Administered 2024-03-25: 40 mg via INTRAVENOUS

## 2024-03-25 MED ORDER — OXYCODONE HCL 5 MG PO TABS: 5.0000 mg | ORAL_TABLET | Freq: Once | ORAL | Status: DC | PRN

## 2024-03-25 SURGICAL SUPPLY — 36 items
BLADE AVERAGE 25X9 (BLADE) IMPLANT
BLADE SURG 10 STRL SS (BLADE) ×1 IMPLANT
BLADE SURG 15 STRL LF DISP TIS (BLADE) ×1 IMPLANT
BNDG COHESIVE 3X5 TAN ST LF (GAUZE/BANDAGES/DRESSINGS) ×1 IMPLANT
BNDG COMPR ESMARK 4X3 LF (GAUZE/BANDAGES/DRESSINGS) ×1 IMPLANT
BNDG ELASTIC 3INX 5YD STR LF (GAUZE/BANDAGES/DRESSINGS) ×1 IMPLANT
BNDG ELASTIC 4INX 5YD STR LF (GAUZE/BANDAGES/DRESSINGS) IMPLANT
BNDG GAUZE DERMACEA FLUFF 4 (GAUZE/BANDAGES/DRESSINGS) IMPLANT
CHLORAPREP W/TINT 26 (MISCELLANEOUS) IMPLANT
DRSG ADAPTIC 3X8 NADH LF (GAUZE/BANDAGES/DRESSINGS) IMPLANT
DRSG XEROFORM 1X8 (GAUZE/BANDAGES/DRESSINGS) IMPLANT
ELECTRODE REM PT RTRN 9FT ADLT (ELECTROSURGICAL) ×1 IMPLANT
GAUZE PAD ABD 8X10 STRL (GAUZE/BANDAGES/DRESSINGS) IMPLANT
GAUZE SPONGE 2X2 STRL 8-PLY (GAUZE/BANDAGES/DRESSINGS) IMPLANT
GAUZE SPONGE 4X4 12PLY STRL (GAUZE/BANDAGES/DRESSINGS) ×1 IMPLANT
GAUZE STRETCH 2X75IN STRL (MISCELLANEOUS) ×1 IMPLANT
GAUZE XEROFORM 1X8 LF (GAUZE/BANDAGES/DRESSINGS) ×1 IMPLANT
GLOVE BIO SURGEON STRL SZ7.5 (GLOVE) ×1 IMPLANT
GLOVE BIOGEL PI IND STRL 7.5 (GLOVE) ×1 IMPLANT
GOWN STRL REUS W/ TWL LRG LVL3 (GOWN DISPOSABLE) ×2 IMPLANT
KIT BASIN OR (CUSTOM PROCEDURE TRAY) ×1 IMPLANT
NDL HYPO 25X1 1.5 SAFETY (NEEDLE) ×1 IMPLANT
NEEDLE HYPO 25X1 1.5 SAFETY (NEEDLE) ×1 IMPLANT
PACK ORTHO EXTREMITY (CUSTOM PROCEDURE TRAY) ×1 IMPLANT
PADDING CAST ABS COTTON 4X4 ST (CAST SUPPLIES) ×2 IMPLANT
SET HNDPC FAN SPRY TIP SCT (DISPOSABLE) IMPLANT
SOLN STERILE WATER BTL 1000 ML (IV SOLUTION) ×1 IMPLANT
SPIKE FLUID TRANSFER (MISCELLANEOUS) IMPLANT
STOCKINETTE 4X48 STRL (DRAPES) IMPLANT
SUT ETHILON 3 0 FSLX (SUTURE) IMPLANT
SUT PROLENE 3 0 PS 2 (SUTURE) IMPLANT
SUT PROLENE 4 0 PS 2 18 (SUTURE) IMPLANT
SYR CONTROL 10ML LL (SYRINGE) ×1 IMPLANT
TUBE CONNECTING 12X1/4 (SUCTIONS) IMPLANT
UNDERPAD 30X36 HEAVY ABSORB (UNDERPADS AND DIAPERS) ×1 IMPLANT
YANKAUER SUCT BULB TIP NO VENT (SUCTIONS) IMPLANT

## 2024-03-25 NOTE — Consult Note (Signed)
 PODIATRY CONSULTATION  NAME CORIE ALLIS MRN [id number] DOB [birth year] DOA 03/21/2024   Reason for consult: osteomyelitis left foot  Attending/Consulting physician: RONAL Bureau DO  History of present illness: Katie Hawkins is a 73 y.o. female with medical history significant for PVD s/p left femoral to peroneal bypass graft (10/2021), T2DM, HTN, HLD  Previously seen 10/16 for same but now has worsening infeciton and concern for osteomyelitis after bypass with vascular. Asked to re eval by vascular team.   Discussed with patient re worsening infection in left great toe concern for osteomyelitis and abscess. Now that she has been revascularized I recommend L great toe amputation or partial first ray amputation as needed.   Past Medical History:  Diagnosis Date   Diabetes mellitus    History of blood transfusion    HTN (hypertension), benign 02/13/2012   Hypercholesterolemia    Hyperlipidemia    Hypertension    nodular lymphocyte predominate hodgkins lymphoma dx'd 10/2010   xrt comp 02/20/11   Peripheral vascular disease        Latest Ref Rng & Units 03/25/2024    3:30 AM 03/24/2024    3:31 AM 03/23/2024    3:14 AM  CBC  WBC 4.0 - 10.5 K/uL 16.9  12.7  11.5   Hemoglobin 12.0 - 15.0 g/dL 89.4  89.9  88.8   Hematocrit 36.0 - 46.0 % 32.6  29.9  34.0   Platelets 150 - 400 K/uL 391  409  426        Latest Ref Rng & Units 03/25/2024    3:30 AM 03/24/2024    3:31 AM 03/23/2024    3:14 AM  BMP  Glucose 70 - 99 mg/dL 801  822  759   BUN 8 - 23 mg/dL 9  8  11    Creatinine 0.44 - 1.00 mg/dL 9.33  9.38  9.30   Sodium 135 - 145 mmol/L 133  134  134   Potassium 3.5 - 5.1 mmol/L 3.2  3.6  4.4   Chloride 98 - 111 mmol/L 94  97  97   CO2 22 - 32 mmol/L 24  23  28    Calcium  8.9 - 10.3 mg/dL 8.9  8.3  9.2       Physical Exam: Lower Extremity Exam  Non palpable DP and PT pulse left foot  Necrotic ulceration with surrounding hyperkeratotic tissue unable to assess depth.  Mild malodor. Erythema and edema of the left great toe. More necrotic changes and erythema today, concern for plantar abscess.   Sensation diminished to light touch  Prior L third toe amputation    ASSESSMENT/PLAN OF CARE 73 y.o. female with PMHx significant for   PVD s/p left femoral to peroneal bypass graft (10/2021), T2DM, HTN, HLD   with worsening  necrotic ulceration and abscess plantar great toe sulcus and cellulitis of the left foot. She is now s/p bypass LLE 2 days post op.   WBC 16.9 A1c  8.6  MRI L foot:  1. Abnormal marrow edema in the tuft of the distal phalanx great toe suspicious for early osteomyelitis. 2. Low-grade diffuse subcutaneous edema in the forefoot more confluent below the first MTP joint and in the great toe, cellulitis is a distinct possibility. 3. Small amount of fluid signal tracks along the flexor digitorum tendons along the midfoot, suggesting extensor digitorum tenosynovitis.  - NPO for Left great toe vs partial first ray amputation as needed for closure purposes. She agrees to proceed.  -  Appreciate vascular surgery, s/p LLE bypass, maximally revascularized - Continue IV abx broad spectrum pending further culture data - Anticoagulation: ok to continue per primary/ vascular revcs - Wound care: Betadine paint to the wound daily - WB status: WBAT in post op shoe - Will continue to follow   Thank you for the consult.  Please contact me directly with any questions or concerns.           [name], DPM [facility]    [address] [place], [place] [address]                Office [phone]  Fax [phone]

## 2024-03-25 NOTE — Progress Notes (Signed)
 [facility] night cross cover note:   I was notified by the patient's RN that the patient's potassium level 3.2 this morning.  I subsequently ordered Kcl 40 meq po x 1 dose now.      Eva Pore, DO Hospitalist

## 2024-03-25 NOTE — Transfer of Care (Signed)
 Immediate Anesthesia Transfer of Care Note  Patient: Katie Hawkins  Procedure(s) Performed: Partial ray amputation, left great toe (Left: Toe)  Patient Location: PACU  Anesthesia Type:MAC  Level of Consciousness: awake, oriented, and drowsy  Airway & Oxygen Therapy: Patient Spontanous Breathing  Post-op Assessment: Report given to RN, Post -op Vital signs reviewed and stable, and Patient moving all extremities  Post vital signs: Reviewed and stable  Last Vitals:  Vitals Value Taken Time  BP 102/78 03/25/24   1653  Temp    Pulse 102 03/25/24    1653  Resp 16 03/25/24      1653  SpO2 98 03/25/24      1653    Last Pain:  Vitals:   03/25/24 1312  TempSrc: Oral  PainSc:       Patients Stated Pain Goal: 0 (03/23/24 2312)  Complications: No notable events documented.

## 2024-03-25 NOTE — Progress Notes (Signed)
 Orthopedic Tech Progress Note Patient Details:  Katie Hawkins January 25, 1951 [id number]  Ortho Devices Type of Ortho Device: Postop shoe/boot Ortho Device/Splint Location: LLE Ortho Device/Splint Interventions: Ordered, Application, Adjustment   Post Interventions Patient Tolerated: Well Instructions Provided: Care of device, Adjustment of device  Adine MARLA Blush 03/25/2024, 6:59 PM

## 2024-03-25 NOTE — Progress Notes (Signed)
 Physical Therapy Treatment Patient Details Name: Katie Hawkins MRN: [id number] DOB: [birth year] Today's Date: 03/25/2024   History of Present Illness 73 y.o. 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 underwent L femoral to peroneal bypass with PTFE. Past medical history significant of PAD, limb ischemia, chronic foot ulcer, obesity, hypertension, hyperlipidemia, diabetes, Hodgkin lymphoma.    PT Comments  Agreeable to use RW to mobilize today, showing improved awareness but still not baseline (husband confirms.) Able to ambulate greater distance and at a CGA level for safety with mild instability but no overt LOB or buckling when she uses this device. Will need to reinforce RW use at all times due to reduced cognitive status in order to minimize fall risk. Reviewed precautions and LE exercises. HR 115 at rest, up to 140 with gait training, SpO2 97% on RA. Patient will continue to benefit from skilled physical therapy services to further improve independence with functional mobility.    If plan is discharge home, recommend the following: A little help with walking and/or transfers;A little help with bathing/dressing/bathroom;Supervision due to cognitive status;Assistance with cooking/housework;Direct supervision/assist for medications management;Direct supervision/assist for financial management;Assist for transportation   Can travel by private vehicle        Equipment Recommendations  None recommended by PT (Husband states they already have a RW available to use.)    Recommendations for Other Services       Precautions / Restrictions Precautions Precautions: Fall Recall of Precautions/Restrictions: Impaired Restrictions Weight Bearing Restrictions Per Provider Order: No     Mobility  Bed Mobility Overal bed mobility: Needs Assistance Bed Mobility: Supine to Sit     Supine to sit: Supervision, HOB elevated     General bed mobility comments:  Supervision for safety and equipment management. Extra time, no physical assist.    Transfers Overall transfer level: Needs assistance Equipment used: Rolling walker (2 wheels) Transfers: Sit to/from Stand Sit to Stand: Contact guard assist           General transfer comment: CGA for safety to rise, minor instabilty but adequately supports herself upon standing with RW to steady. Denies dizziness.    Ambulation/Gait Ambulation/Gait assistance: Contact guard assist Gait Distance (Feet): 175 Feet Assistive device: Rolling walker (2 wheels) Gait Pattern/deviations: Step-through pattern, Decreased stride length, Knee flexed in stance - left, Drifts right/left, Wide base of support, Antalgic Gait velocity: dec Gait velocity interpretation: <1.8 ft/sec, indicate of risk for recurrent falls   General Gait Details: Improved stability with RW for support, challenged with dynamic tasks including turns, backwards steps, and navigating in congested areas. Agreeable and better acknowledges need for RW at this time and when she returns home. Husband present, observed, and supportive. HR increased to 140 while ambulating. No dyspnea noted. Spo2 97% on RA. No overt buckling noted but still slightly antalgic with RW.   Stairs             Wheelchair Mobility     Tilt Bed    Modified Rankin (Stroke Patients Only)       Balance Overall balance assessment: Needs assistance Sitting-balance support: No upper extremity supported, Feet supported Sitting balance-Leahy Scale: Good     Standing balance support: No upper extremity supported Standing balance-Leahy Scale: Fair Standing balance comment: More stable with RW for support.                            Communication Communication Communication:  No apparent difficulties  Cognition Arousal: Alert Behavior During Therapy: Impulsive   PT - Cognitive impairments: Safety/Judgement, Problem solving, Memory, Awareness                        PT - Cognition Comments: Better than initial consult but still showing reduced awareness, impulsiveness. Following commands: Impaired Following commands impaired: Follows one step commands inconsistently    Cueing Cueing Techniques: Verbal cues, Gestural cues, Tactile cues  Exercises General Exercises - Lower Extremity Ankle Circles/Pumps: AROM, Both, 15 reps, Seated    General Comments General comments (skin integrity, edema, etc.): HR 115 at rest, up to 140 with gait. SpO2 97% on RA. No dyspnea or dizziness with exertion. Husband present very supportive. Reviewed precauitions and safety awareness.      Pertinent Vitals/Pain Pain Assessment Pain Assessment: No/denies pain Pain Intervention(s): Monitored during session    Home Living                          Prior Function            PT Goals (current goals can now be found in the care plan section) Acute Rehab PT Goals Patient Stated Goal: Go home today PT Goal Formulation: With patient Time For Goal Achievement: 04/07/24 Potential to Achieve Goals: Good Progress towards PT goals: Progressing toward goals    Frequency    Min 2X/week      PT Plan      Co-evaluation              AM-PAC PT 6 Clicks Mobility   Outcome Measure  Help needed turning from your back to your side while in a flat bed without using bedrails?: A Little Help needed moving from lying on your back to sitting on the side of a flat bed without using bedrails?: A Little Help needed moving to and from a bed to a chair (including a wheelchair)?: A Little Help needed standing up from a chair using your arms (e.g., wheelchair or bedside chair)?: A Little Help needed to walk in hospital room?: A Little Help needed climbing 3-5 steps with a railing? : A Lot 6 Click Score: 17    End of Session Equipment Utilized During Treatment: Gait belt Activity Tolerance: Patient tolerated treatment well Patient  left: with call bell/phone within reach;in chair;with chair alarm set;with family/visitor present   PT Visit Diagnosis: Unsteadiness on feet (R26.81);Other abnormalities of gait and mobility (R26.89);Muscle weakness (generalized) (M62.81);History of falling (Z91.81);Difficulty in walking, not elsewhere classified (R26.2);Other symptoms and signs involving the nervous system (R29.898);Pain     Time: 9149-9087 PT Time Calculation (min) (ACUTE ONLY): 22 min  Charges:    $Gait Training: 8-22 mins PT General Charges $$ ACUTE PT VISIT: 1 Visit                     Leontine Roads, PT, DPT [facility] Services Physical Therapist Office: [phone] Website: [email or web address]    Leontine GORMAN Roads 03/25/2024, 10:55 AM

## 2024-03-25 NOTE — Plan of Care (Signed)
  Problem: Education: Goal: Knowledge of General Education information will improve Description: Including pain rating scale, medication(s)/side effects and non-pharmacologic comfort measures Outcome: Progressing   Problem: Health Behavior/Discharge Planning: Goal: Ability to manage health-related needs will improve Outcome: Progressing   Problem: Clinical Measurements: Goal: Cardiovascular complication will be avoided Outcome: Progressing   Problem: Coping: Goal: Level of anxiety will decrease Outcome: Progressing  Problem: Safety: Goal: Ability to remain free from injury will improve Outcome: Progressing   

## 2024-03-25 NOTE — Anesthesia Preprocedure Evaluation (Signed)
 Anesthesia Evaluation  Patient identified by MRN, date of birth, ID band Patient confused    Reviewed: Allergy & Precautions, NPO status , Patient's Chart, lab work & pertinent test results, reviewed documented beta blocker date and time   History of Anesthesia Complications Negative for: history of anesthetic complications  Airway Mallampati: II  TM Distance: >3 FB     Dental no notable dental hx.    Pulmonary neg COPD   breath sounds clear to auscultation       Cardiovascular hypertension, (-) angina + Peripheral Vascular Disease  (-) CAD, (-) Past MI and (-) Cardiac Stents (-) dysrhythmias (-) pacemaker Rhythm:Regular Rate:Tachycardia     Neuro/Psych neg Seizures Probable delirium    GI/Hepatic ,neg GERD  ,,(+) neg Cirrhosis        Endo/Other  diabetes, Type 2    Renal/GU      Musculoskeletal   Abdominal   Peds  Hematology   Anesthesia Other Findings   Reproductive/Obstetrics                              Anesthesia Physical Anesthesia Plan  ASA: 3  Anesthesia Plan: MAC   Post-op Pain Management:    Induction: Intravenous  PONV Risk Score and Plan: 1 and Ondansetron  and Dexamethasone   Airway Management Planned: Natural Airway and Simple Face Mask  Additional Equipment:   Intra-op Plan:   Post-operative Plan:   Informed Consent: I have reviewed the patients History and Physical, chart, labs and discussed the procedure including the risks, benefits and alternatives for the proposed anesthesia with the patient or authorized representative who has indicated his/her understanding and acceptance.     Dental advisory given and Consent reviewed with POA  Plan Discussed with: CRNA  Anesthesia Plan Comments:          Anesthesia Quick Evaluation

## 2024-03-25 NOTE — Progress Notes (Signed)
 EOS  Pt confusion continues  Hallucinations (talking to her cat, seeing pills in her hand that were not there, seeing things on the floor)  Unsteady gait, requires x1 assist with walker  Pt impulsive, bed alarm/chair alarm alerted multiple times an hour, mostly redirectable  Pt asked many times overnight when she was going to be discharged

## 2024-03-25 NOTE — Progress Notes (Addendum)
 Progress Note    03/25/2024 7:17 AM 2 Days Post-Op  Subjective:  Katie Hawkins is a well appearing 73 year old female with no complaints. She denies any overnight issues. She wishes to be discharged today.    Vitals:   03/24/24 2342 03/25/24 0404  BP: (!) 109/52 125/76  Pulse: (!) 107 (!) 110  Resp: 19 19  Temp: 99.1 F (37.3 C) 98.3 F (36.8 C)  SpO2: 100% 100%   Physical Exam: Cardiac:  S1S2, Sinus Tachycardia @ 122 bpm on telemetry in the room. Lungs:  CTA, non-labored. Incisions:  Clean, dry, and intact. Extremities:  Brisk L. Peroneal and PT via doppler. Abdomen:  Soft, non-tender. Neurologic: Alert and oriented.  CBC    Component Value Date/Time   WBC 16.9 (H) 03/25/2024 0330   RBC 3.52 (L) 03/25/2024 0330   HGB 10.5 (L) 03/25/2024 0330   HGB 13.4 02/20/2014 1159   HCT 32.6 (L) 03/25/2024 0330   HCT 41.0 02/20/2014 1159   PLT 391 03/25/2024 0330   PLT 318 02/20/2014 1159   MCV 92.6 03/25/2024 0330   MCV 89.8 02/20/2014 1159   MCH 29.8 03/25/2024 0330   MCHC 32.2 03/25/2024 0330   RDW 13.0 03/25/2024 0330   RDW 13.6 02/20/2014 1159   LYMPHSABS 4.2 (H) 03/10/2024 0921   LYMPHSABS 1.7 02/20/2014 1159   MONOABS 1.4 (H) 03/10/2024 0921   MONOABS 0.8 02/20/2014 1159   EOSABS 0.1 03/10/2024 0921   EOSABS 0.1 02/20/2014 1159   BASOSABS 0.1 03/10/2024 0921   BASOSABS 0.1 02/20/2014 1159    BMET    Component Value Date/Time   NA 133 (L) 03/25/2024 0330   NA 140 02/20/2014 1159   K 3.2 (L) 03/25/2024 0330   K 3.7 02/20/2014 1159   CL 94 (L) 03/25/2024 0330   CL 103 08/11/2012 0838   CO2 24 03/25/2024 0330   CO2 27 02/20/2014 1159   GLUCOSE 198 (H) 03/25/2024 0330   GLUCOSE 176 (H) 02/20/2014 1159   GLUCOSE 165 (H) 08/11/2012 0838   BUN 9 03/25/2024 0330   BUN 16.6 02/20/2014 1159   CREATININE 0.66 03/25/2024 0330   CREATININE 0.7 02/20/2014 1159   CALCIUM  8.9 03/25/2024 0330   CALCIUM  10.0 02/20/2014 1159   GFRNONAA >60 03/25/2024 0330    GFRAA >60 11/22/2010 1515    INR    Component Value Date/Time   INR 1.0 03/23/2024 0314     Intake/Output Summary (Last 24 hours) at 03/25/2024 0717 Last data filed at 03/25/2024 0308 Gross per 24 hour  Intake 789.64 ml  Output --  Net 789.64 ml     Assessment/Plan:  73 y.o. female is s/p  femoral to peroneal bypass with PTFE  2 Days Post-Op   - CBC - Leukocytosis of 16.9 from 12.7, one day ago. IV Zosyn and Vancomycin, on Hospitalist service on board.  - L. Foot is warm and well perfused with brisk PT and peroneal via doppler. - Incisions are well appearing; continue prevena vac in the groin for one week.  - Ambulates with walker at home. Is ambulating around the room well with assist device.    Lytle Pizza, PA-S  [facility] [phone] 03/25/2024 7:17 AM   Addendum: -pt with increasing leukocytosis and low grade fever.  Given pt has PTFE bypass and gangrenous foot and still on IV  vanc and zosyn, would like for Dr. Malvin to see pt prior to discharge.  Discussed with pt and husband that she  does have synthetic material for bypass and if this were to get infected, it could be life threatening.  May need toe amp prior to discharge.      Lucie Apt, [facility] 03/25/2024 7:41 AM  I have independently interviewed and examined patient and agree with PA assessment and plan above.  I discussed with her the need for at least first toe amputation possible transmetatarsal amputation.  She is agreeable today with Dr. Malvin.  Tad Fancher C. Sheree, MD [facility] [place] Office: [phone] Pager: [phone]

## 2024-03-25 NOTE — Op Note (Signed)
 Full Operative Report  Date of Operation: 4:49 PM, 03/25/2024   Patient: Katie Hawkins - 73 y.o. female  Surgeon: [facility], DPM   Assistant: None  Diagnosis: Osteomyelitis left great toe, abscess plantar great toe joint  Procedure:  1. Partial first ray amputation, left foot    Anesthesia: Monitor Anesthesia Care  Waddell Lauraine NOVAK, MD  Anesthesiologist: Keneth Lynwood POUR, MD; Waddell Lauraine NOVAK, MD CRNA: Jerl Donald LABOR, CRNA   Estimated Blood Loss: Minimal   Hemostasis: 1) Anatomical dissection, mechanical compression, electrocautery 2) No tourniquet was used  Implants: * No implants in log *  Materials: prolene 3-0, skin staples  Injectables: 1) Pre-operatively: 10 cc of 50:50 mixture 1%lidocaine  plain and 0.5% marcaine  plain 2) Post-operatively: 10 cc of 50:50 mixture 1%lidocaine  plain and 0.5% marcaine  plain  Specimens: - Pathology: Left great toe and 1st met head - Microbiology: tissue culture from left great    Antibiotics: IV antibiotics given per schedule on the floor  Drains: None  Complications: Patient tolerated the procedure well without complication.   Operative findings: As below in detailed report  Indications for Procedure: Katie Hawkins presents to [facility] [name] with a chief complaint of osteomyelitis and abscess left great toe, plantar 1st mpj. The patient has failed conservative treatments of various modalities. At this time the patient has elected to proceed with surgical correction. All alternatives, risks, and complications of the procedures were thoroughly explained to the patient. Patient exhibits appropriate understanding of all discussion points and informed consent was signed and obtained in the chart with no guarantees to surgical outcome given or implied.  Description of Procedure: Patient was brought to the operating room. Patient remained on their hospital bed in the supine position. A surgical timeout was  performed and all members of the operating room, the procedure, and the surgical site were identified. anesthesia occurred as per anesthesia record. Local anesthetic as previously described was then injected about the operative field in a local infiltrative block.  The operative lower extremity as noted above was then prepped and draped in the usual sterile manner. The following procedure then began.  Attention was directed to the LEFT lower extremity. A racquet type incision was made over the dorsal medial aspect of the first metatarsal, including the entire digit. A full thickness incision was made down to bone using a #15 blade. The incision was continued through the soft tissue down to the shaft of the 1st metatarsal. At this time, 3 cc of purulent drainage was noted near the metatarsal head plantarly. A deep wound culture was taken at this time and passed off the field. Using a #15 blade, the digit was then disarticulated in its entirety at the metatarsophalangeal joint and freed of all soft tissue attachments. The specimen was passed off the field and sent for gross pathology. Next, a 15 blade was then used to free up the periosteum on the metatarsal shaft. Using a sagittal saw, the metatarsal was cut in a dorsal distal to plantar proximal orientation. A bone culture was taken at this time. The bone was passed off the field and sent as a gross specimen with the toe.  All remaining non-viable and necrotic tissues were sharply resected and removed. Extensor and flexors tendons were grasped with a hemostat and cut proximally. The surgical site was then flushed with 1000ml of saline with pulse lavage.  The skin flap was then approximated and closed with 3-0 Prolene and skin staples.   The  surgical site was then dressed with Xeroform 4 x 4 gauze Kerlix Ace roll. The patient tolerated both the procedure and anesthesia well with vital signs stable throughout. The patient was transferred in good condition and all  vital signs stable  from the OR to recovery under the discretion of anesthesia.  Condition: Vital signs stable, neurovascular status unchanged from preoperative   Surgical plan:  Expect clean surgical margin.  Needed partial first ray amputation to achieve closure purposes given necrotic tissues at the plantar aspect of the left great toe sulcus and plantar first MPJ.  Also abscess noted at that area.  Was thoroughly cleaned and felt suitable for closure today.  Recommend 7 days p.o. antibiotics from time of surgery.  Likely stable for discharge home tomorrow.  The patient will be weightbearing in a postop shoe to the operative limb until further instructed. The dressing is to remain clean, dry, and intact. Will continue to follow unless noted elsewhere.   [name], DPM [facility]

## 2024-03-26 ENCOUNTER — Other Ambulatory Visit (HOSPITAL_COMMUNITY): Payer: Self-pay

## 2024-03-26 ENCOUNTER — Encounter (HOSPITAL_COMMUNITY): Payer: Self-pay | Admitting: Podiatry

## 2024-03-26 DIAGNOSIS — I70229 Atherosclerosis of native arteries of extremities with rest pain, unspecified extremity: Secondary | ICD-10-CM | POA: Diagnosis not present

## 2024-03-26 LAB — CBC
HCT: 30.7 % — ABNORMAL LOW (ref 36.0–46.0)
Hemoglobin: 10 g/dL — ABNORMAL LOW (ref 12.0–15.0)
MCH: 29.8 pg (ref 26.0–34.0)
MCHC: 32.6 g/dL (ref 30.0–36.0)
MCV: 91.4 fL (ref 80.0–100.0)
Platelets: 406 K/uL — ABNORMAL HIGH (ref 150–400)
RBC: 3.36 MIL/uL — ABNORMAL LOW (ref 3.87–5.11)
RDW: 13 % (ref 11.5–15.5)
WBC: 17.4 K/uL — ABNORMAL HIGH (ref 4.0–10.5)
nRBC: 0 % (ref 0.0–0.2)

## 2024-03-26 LAB — BASIC METABOLIC PANEL WITH GFR
Anion gap: 16 — ABNORMAL HIGH (ref 5–15)
BUN: 9 mg/dL (ref 8–23)
CO2: 22 mmol/L (ref 22–32)
Calcium: 8.5 mg/dL — ABNORMAL LOW (ref 8.9–10.3)
Chloride: 94 mmol/L — ABNORMAL LOW (ref 98–111)
Creatinine, Ser: 0.62 mg/dL (ref 0.44–1.00)
GFR, Estimated: >60 mL/min (ref >60)
Glucose, Bld: 189 mg/dL — ABNORMAL HIGH (ref 70–99)
Potassium: 3.5 mmol/L (ref 3.5–5.1)
Sodium: 132 mmol/L — ABNORMAL LOW (ref 135–145)

## 2024-03-26 LAB — GLUCOSE, CAPILLARY
Glucose-Capillary: 208 mg/dL — ABNORMAL HIGH (ref 70–99)
Glucose-Capillary: 225 mg/dL — ABNORMAL HIGH (ref 70–99)
Glucose-Capillary: 204 mg/dL — ABNORMAL HIGH (ref 70–99)
Glucose-Capillary: 218 mg/dL — ABNORMAL HIGH (ref 70–99)

## 2024-03-26 MED ORDER — VANCOMYCIN HCL 1250 MG/250ML IV SOLN
1250.0000 mg | INTRAVENOUS | Status: DC
  Administered 2024-03-26: 1250 mg via INTRAVENOUS
  Filled 2024-03-26: qty 250

## 2024-03-26 MED ORDER — OXYCODONE HCL 5 MG PO TABS: 2.5000 mg | ORAL_TABLET | ORAL | Status: DC | PRN

## 2024-03-26 MED ORDER — TRAZODONE HCL 50 MG PO TABS
50.0000 mg | ORAL_TABLET | Freq: Every day | ORAL | Status: DC
  Administered 2024-03-26 – 2024-03-27 (×2): 50 mg via ORAL
  Filled 2024-03-26 (×2): qty 1

## 2024-03-26 MED ORDER — ACETAMINOPHEN 500 MG PO TABS
1000.0000 mg | ORAL_TABLET | Freq: Three times a day (TID) | ORAL | Status: DC
  Administered 2024-03-26 – 2024-03-28 (×4): 1000 mg via ORAL
  Filled 2024-03-26 (×6): qty 2

## 2024-03-26 MED ORDER — OXYCODONE HCL 5 MG PO TABS
5.0000 mg | ORAL_TABLET | ORAL | Status: DC | PRN
  Administered 2024-03-26 – 2024-03-28 (×4): 5 mg via ORAL
  Filled 2024-03-26 (×4): qty 1

## 2024-03-26 MED ORDER — AMOXICILLIN-POT CLAVULANATE 875-125 MG PO TABS
1.0000 | ORAL_TABLET | Freq: Two times a day (BID) | ORAL | Status: DC
  Administered 2024-03-26 – 2024-03-28 (×4): 1 via ORAL
  Filled 2024-03-26 (×4): qty 1

## 2024-03-26 MED ORDER — ZIPRASIDONE MESYLATE 20 MG IM SOLR
20.0000 mg | Freq: Once | INTRAMUSCULAR | Status: AC | PRN
  Administered 2024-03-27: 20 mg via INTRAMUSCULAR
  Filled 2024-03-26: qty 20

## 2024-03-26 MED ORDER — HALOPERIDOL LACTATE 5 MG/ML IJ SOLN
5.0000 mg | Freq: Once | INTRAMUSCULAR | Status: AC
  Administered 2024-03-26: 5 mg via INTRAVENOUS
  Filled 2024-03-26: qty 1

## 2024-03-26 NOTE — Progress Notes (Signed)
 PROGRESS NOTE    Katie Hawkins  FMW:[id number] DOB: 1951/04/28 DOA: 03/21/2024 PCP: Claudene Pellet, MD  No chief complaint on file.   Brief Narrative:    73 yr old woman who was admitted to [facility] by vascular surgery due to concern for critical limb ischemia on 03/21/2024. She went for Deniro Laymon follow up visit with vascular surgery on 03/20/2024 after  being inpatient 10/16/-10/17 for ischemia and early osteomyelitis on MRI.  The ulcer had worsened. The plan is for the patient to undergo angiography on 03/23/2024.    On 03/23/2024 after the procedure the patient is seen in PACU. She underwent Arek Spadafore left common femoral to peroneal artery bypass with Tamecia Mcdougald subfascial tunneling of 6 mm e PTFE She has tolerated the procedure well. Per vascular surgery the patient will be started on Hania Cerone DOAC once her hemoglobin is stable. She will continue ASA and statin therapies. Will mobilize as possible. He will see the patient in 4 weeks with ABI and left lower extremity arterial duplex. Although vascular surgery is just watching the left foot at this point, there is Crystelle Ferrufino high likelihood for amputation of the great toe in the future.   This afternoon the patient has been noted to be unsteady on her feet. He husband is unsure about her judgement and her ability to remain safe on her feet at home.   On 03/25/2024 the patient's WBC count increased to over 23k. The foot was re-evaluated by podiatry and vascular surgery. The plan changed to amputation of the partial first ray of the left foot. Assessment & Plan:   Principal Problem:   Critical lower limb ischemia (HCC) Active Problems:   Type 2 diabetes mellitus with complication, without long-term current use of insulin  (HCC)   Hyperlipidemia associated with type 2 diabetes mellitus (HCC)   Hypertension associated with diabetes (HCC)   Obesity   Personal history of Hodgkin lymphoma   PAD (peripheral artery disease)   Ulcer of left foot (HCC)   Osteomyelitis of great toe of  left foot (HCC)  Critical Limb Ischemia PAD Osteomyelitis  Now s/p L common femoral to peroneal artery bypass 10/29 with vascular S/p partial first ray amputation, L foot 10/31 Vascular recommending 4 week follow up with ABI and LLE arterial duplex - continue aspirin  and doac therapy, continue statin.  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 Will minimize pain meds, reduce oxy dose Follow b12, TSH, folate - consider additional w/u if persistent Delirium precautions  Hypertension Metoprolol , lisinopril    Dyslipidemia Lipitor, zetia    T2DM SSI  GERD ppi    DVT prophylaxis: xarelto Code Status: full Family Communication: husband at bedside Disposition:   Status is: Inpatient Remains inpatient appropriate because: inpatient   Consultants:  Vascular podiatry  Procedures:   10/29  PROCEDURE:   Left common femoral to peroneal artery bypass with 6mm ePTFE in subfascial tunnel (CPT 64333)  10/31 Procedure:  1. Partial first ray amputation, left foot  Antimicrobials:  Anti-infectives (From admission, onward)    Start     Dose/Rate Route Frequency Ordered Stop   03/26/24 1630  vancomycin (VANCOREADY) IVPB 1250 mg/250 mL        1,250 mg 166.7 mL/hr over 90 Minutes Intravenous Every 24 hours 03/26/24 1018     03/24/24 2200  doxycycline  (VIBRA -TABS) tablet 100 mg        100 mg Oral Every 12 hours 03/24/24 1508 03/31/24 2159   03/24/24 0000  doxycycline  (VIBRA -TABS) 100 MG tablet        100 mg Oral Every 12 hours 03/24/24  1526     03/23/24 1600  ceFAZolin  (ANCEF ) IVPB 2g/100 mL premix        2 g 200 mL/hr over 30 Minutes Intravenous Every 8 hours 03/23/24 1156 03/24/24 0023   03/23/24 0600  ceFAZolin  (ANCEF ) IVPB 2g/100 mL premix        2 g 200 mL/hr over 30 Minutes Intravenous 30 min pre-op  03/23/24 0238 03/23/24 0825   03/21/24 2200  piperacillin-tazobactam (ZOSYN) IVPB 3.375 g       Placed in Followed by Linked Group   3.375 g 12.5 mL/hr over 240 Minutes Intravenous Every 8 hours 03/21/24 1533     03/21/24 1630  vancomycin (VANCOCIN) IVPB 1000 mg/200 mL premix  Status:  Discontinued        1,000 mg 200 mL/hr over 60 Minutes Intravenous Every 24 hours 03/21/24 1533 03/26/24 1018   03/21/24 1630  piperacillin-tazobactam (ZOSYN) IVPB 3.375 g       Placed in Followed by Linked Group   3.375 g 100 mL/hr over 30 Minutes Intravenous  Once 03/21/24 1533 03/21/24 1656       Subjective: Asking about going home Husband at bedside - he notes confusion  She's confused, adamant about her version of events (which is not correct)   Objective: Vitals:   03/26/24 0820 03/26/24 1100 03/26/24 1502 03/26/24 1610  BP: 107/73 127/66 109/73 102/67  Pulse: (!) 125 (!) 117 (!) 119 (!) 106  Resp: (!) 25 (!) 21 (!) 30 (!) 22  Temp: 98.9 F (37.2 C) 98.8 F (37.1 C) 98.1 F (36.7 C)   TempSrc: Oral Oral Oral   SpO2: 92% 93% 93%   Weight:        Intake/Output Summary (Last 24 hours) at 03/26/2024 1707 Last data filed at 03/26/2024 1500 Gross per 24 hour  Intake 738.45 ml  Output --  Net 738.45 ml   Filed Weights   03/24/24 0505  Weight: 75.6 kg    Examination:  General exam: Appears calm and comfortable  Respiratory system: unlabored Cardiovascular system: RRR Central nervous system: alert - confused, moving all extremities Extremities: L foot with dressing intact    Data Reviewed: I have personally reviewed following labs and imaging studies  CBC: Recent Labs  Lab 03/21/24 1609  03/23/24 0314 03/24/24 0331 03/25/24 0330 03/26/24 0316  WBC 11.3* 11.5* 12.7* 16.9* 17.4*  HGB 10.7* 11.1* 10.0* 10.5* 10.0*  HCT 32.2* 34.0* 29.9* 32.6* 30.7*  MCV 91.0 92.1 91.4 92.6 91.4  PLT 415* 426* 409* 391 406*    Basic Metabolic Panel: Recent Labs  Lab 03/21/24 1609 03/23/24 0314 03/24/24 0331 03/25/24 0330 03/26/24 0316  NA 135 134* 134* 133* 132*  K 4.5 4.4 3.6 3.2* 3.5  CL 98 97* 97* 94* 94*  CO2 26 28 23 24 22   GLUCOSE 269* 240* 177* 198* 189*  BUN 16 11 8 9 9   CREATININE 0.71 0.69 0.61 0.66 0.62  CALCIUM  8.8* 9.2 8.3* 8.9 8.5*    GFR: Estimated Creatinine Clearance: 60.1 mL/min (by C-G formula based  on SCr of 0.62 mg/dL).  Liver Function Tests: Recent Labs  Lab 03/21/24 1609 03/23/24 0314  AST 16 17  ALT 19 18  ALKPHOS 56 52  BILITOT 0.4 0.4  PROT 6.2* 6.5  ALBUMIN 2.7* 2.8*    CBG: Recent Labs  Lab 03/25/24 2052 03/25/24 2147 03/26/24 0716 03/26/24 1104 03/26/24 1504  GLUCAP 146* 155* 208* 225* 204*     No results found for this or any previous visit (from the past 240 hours).       Radiology Studies: DG Foot 2 Views Left Result Date: 03/25/2024 CLINICAL DATA:  Postop left first digit amputation EXAM: DG FOOT 2V*L* COMPARISON:  03/10/2024 FINDINGS: Frontal and lateral views of the left foot demonstrate interval transmetatarsal amputation of the first digit. Prior third digit amputation again noted. Prior healed fifth metatarsal fracture. No other acute bony abnormalities. Postsurgical changes in the soft tissues of the first digit. IMPRESSION: 1. Interval transmetatarsal amputation of the first digit, with expected postsurgical changes in the soft tissues. Electronically Signed   By: Ozell Daring M.D.   On: 03/25/2024 21:28        Scheduled Meds:  aspirin  EC  81 mg Oral Daily   atorvastatin  40 mg Oral QHS   clopidogrel   75 mg Oral Daily   doxycycline   100 mg Oral Q12H   ezetimibe   10 mg Oral Daily   insulin  aspart   0-15 Units Subcutaneous TID WC   insulin  aspart  0-5 Units Subcutaneous QHS   insulin  glargine-yfgn  15 Units Subcutaneous Daily   lisinopril   10 mg Oral Daily   metoprolol  succinate  25 mg Oral Daily   pantoprazole   40 mg Oral Daily   rivaroxaban  2.5 mg Oral BID   sodium chloride  flush  3 mL Intravenous Q12H   sodium chloride  flush  3 mL Intravenous Q12H   Continuous Infusions:  sodium chloride      piperacillin-tazobactam (ZOSYN)  IV 3.375 g (03/26/24 1604)   vancomycin 1,250 mg (03/26/24 1620)     LOS: 5 days    Time spent: over 30 min     Meliton Monte, MD [facility]   To contact the attending provider between 7A-7P or the covering provider during after hours 7P-7A, please log into the web site [email or web address] and access using universal [place] password for that web site. If you do not have the password, please call the hospital operator.  03/26/2024, 5:07 PM

## 2024-03-26 NOTE — Progress Notes (Signed)
 Progress Note   Patient: Katie Hawkins FMW:[id number] DOB: Jan 12, 1951 DOA: 03/21/2024     5 DOS: the patient was seen and examined on 03/25/2024   Brief hospital course: The patient is a 73 yr old woman who was admitted to [facility] by vascular surgery due to concern for critical limb ischemia on 03/21/2024. She went for a follow up visit with vascular surgery on 03/20/2024 after  being inpatient 10/16/-10/17 for ischemia and early osteomyelitis on MRI.  The ulcer had worsened. The plan is for the patient to undergo angiography on 03/23/2024.   On 03/23/2024 after the procedure the patient is seen in PACU. She underwent a left common femoral to peroneal artery bypass with a subfascial tunneling of 6 mm e PTFE She has tolerated the procedure well. Per vascular surgery the patient will be started on a DOAC once her hemoglobin is stable. She will continue ASA and statin therapies. Will mobilize as possible. He will see the patient in 4 weeks with ABI and left lower extremity arterial duplex. Although vascular surgery is just watching the left foot at this point, there is a high likelihood for amputation of the great toe in the future.  This afternoon the patient has been noted to be unsteady on her feet. He husband is unsure about her judgement and her ability to remain safe on her feet at home.  On 03/25/2024 the patient's WBC count increased to over 23k. The foot was re-evaluated by podiatry and vascular surgery. The plan changed to amputation of the partial first ray of the left foot.  Assessment and Plan: Critical limb ischemia PAD Ischemic foot ulcer > As per HPI and recent vascular surgery notes.  Patient with known extensive PAD presenting with worsening foot ulcer as direct admit from vascular surgery office for redo bypass to allow for healing of wound and healing of potential future amputation if needed. > History of left femoral to peroneal bypass in 2023 with endovascular intervention in  2024 and early 2025.  Subsequently developed silent occlusion noted on reevaluation in June 2025, asymptomatic at that time.  Patient seen earlier this month with evaluation by vascular surgery showing known occlusion of the bypass with occlusion of native left popliteal, AT, PT arteries.  Followed up outpatient for consideration of redo bypass. > As above, at follow-up wound had worsened and patient was directly admitted for ischemia with plan for redo bypass. -Monitoring on progressive unit - Appreciate vascular surgery recommendations and assistance - Pt underwent Left common femoral to peroneal artery bypass with 6mm ePTFE in subfascial tunnel on 03/23/2024 - Continue with vancomycin and Zosyn as ordered - Hold Plavix  - Continue ASA - On heparin  - As needed pain medication - Continue atorvastatin, Zetia  - On 03/25/2024 the patient's WBC count increased to over 23k. The foot was re-evaluated by podiatry and vascular surgery. The plan changed to amputation of the partial first ray of the left foot.   Hypertension - Continue lisinopril , metoprolol    Hyperlipidemia - Continue home atorvastatin, Zetia    Diabetes - SSI   History of Hodgkin's lymphoma - Noted   Obesity - Noted  Subjective: The patient is resting comfortably. She is very somnolent  Physical Exam: Vitals:   03/25/24 1745 03/25/24 2049 03/26/24 0110 03/26/24 0338  BP: 118/61 (!) 118/59 99/61 128/81  Pulse: (!) 107  (!) 123 (!) 115  Resp: 20 16 (!) 22 16  Temp: 98.3 F (36.8 C) (!) 97.4 F (36.3 C) 97.9 F (36.6 C) 98.9  F (37.2 C)  TempSrc:  Oral Oral Oral  SpO2: 92% 94% 96% 96%  Weight:       Exam:  Constitutional:  The patient is very somnolent. No acute distress. Respiratory:  No increased work of breathing. No wheezes, rales, or rhonchi No tactile fremitus Cardiovascular:  Regular rate and rhythm No murmurs, ectopy, or gallups. No lateral PMI. No thrills. Abdomen:  Abdomen is soft, non-tender,  non-distended No hernias, masses, or organomegaly Normoactive bowel sounds.  Musculoskeletal:  No cyanosis, clubbing, or edema Significant ulceration of the left great toe with swelling and erythema. Left lower extremity and foot are bandaged. Skin:  No rashes, lesions, ulcers palpation of skin: no induration or nodules Neurologic:  CN 2-12 intact Sensation all 4 extremities intact Psychiatric:  Mental status Mood, affect appropriate Orientation to person, place, time  judgment and insight appear intact  Data Reviewed:  CBC, BMP  Family Communication: None available  Disposition: Status is: Inpatient Remains inpatient appropriate because: Duet to limb ischemia.  Planned Discharge Destination: TBD   Time spent: 36 minutes  Author: Jodette Wik, DO 03/25/2024 5:58 PM  For on call review [email or web address].

## 2024-03-26 NOTE — Progress Notes (Addendum)
  Progress Note    03/26/2024 6:58 AM 1 Day Post-Op  Subjective:  confused this am.  Husband at bedside  afebrile  Vitals:   03/26/24 0110 03/26/24 0338  BP: 99/61 128/81  Pulse: (!) 123 (!) 115  Resp: (!) 22 16  Temp: 97.9 F (36.6 C) 98.9 F (37.2 C)  SpO2: 96% 96%    Physical Exam: General:  no distress Cardiac:  regular Lungs:  non labored Incisions:  left lower leg incision clean and dry; left foot bandaged.  Extremities:  brisk left peroneal doppler flow   CBC    Component Value Date/Time   WBC 17.4 (H) 03/26/2024 0316   RBC 3.36 (L) 03/26/2024 0316   HGB 10.0 (L) 03/26/2024 0316   HGB 13.4 02/20/2014 1159   HCT 30.7 (L) 03/26/2024 0316   HCT 41.0 02/20/2014 1159   PLT 406 (H) 03/26/2024 0316   PLT 318 02/20/2014 1159   MCV 91.4 03/26/2024 0316   MCV 89.8 02/20/2014 1159   MCH 29.8 03/26/2024 0316   MCHC 32.6 03/26/2024 0316   RDW 13.0 03/26/2024 0316   RDW 13.6 02/20/2014 1159   LYMPHSABS 4.2 (H) 03/10/2024 0921   LYMPHSABS 1.7 02/20/2014 1159   MONOABS 1.4 (H) 03/10/2024 0921   MONOABS 0.8 02/20/2014 1159   EOSABS 0.1 03/10/2024 0921   EOSABS 0.1 02/20/2014 1159   BASOSABS 0.1 03/10/2024 0921   BASOSABS 0.1 02/20/2014 1159    BMET    Component Value Date/Time   NA 132 (L) 03/26/2024 0316   NA 140 02/20/2014 1159   K 3.5 03/26/2024 0316   K 3.7 02/20/2014 1159   CL 94 (L) 03/26/2024 0316   CL 103 08/11/2012 0838   CO2 22 03/26/2024 0316   CO2 27 02/20/2014 1159   GLUCOSE 189 (H) 03/26/2024 0316   GLUCOSE 176 (H) 02/20/2014 1159   GLUCOSE 165 (H) 08/11/2012 0838   BUN 9 03/26/2024 0316   BUN 16.6 02/20/2014 1159   CREATININE 0.62 03/26/2024 0316   CREATININE 0.7 02/20/2014 1159   CALCIUM  8.5 (L) 03/26/2024 0316   CALCIUM  10.0 02/20/2014 1159   GFRNONAA >60 03/26/2024 0316   GFRAA >60 11/22/2010 1515    INR    Component Value Date/Time   INR 1.0 03/23/2024 0314     Intake/Output Summary (Last 24 hours) at 03/26/2024  0658 Last data filed at 03/25/2024 1833 Gross per 24 hour  Intake 758.45 ml  Output 10 ml  Net 748.45 ml      Assessment/Plan:  73 y.o. female is s/p:  femoral to peroneal bypass with PTFE on 03/23/2024 by Dr. Magda and Partial first ray amputation, left foot on 03/25/2024 by Dr. Malvin. 1 Day Post-Op   -pt with brisk doppler flow left peroneal.   -confused this am-discussed with husband to turn lights on and raise blinds.  -continue IV abx while hospitalized -DVT prophylaxis:  Xarelto   Samantha Rhyne, PA-C [facility] [phone] 03/26/2024 6:58 AM  VASCULAR STAFF ADDENDUM: I have independently interviewed and examined the patient. I agree with the above.   Norman GORMAN Serve MD [facility] Phone Number: [phone] 03/26/2024 8:16 AM

## 2024-03-26 NOTE — Progress Notes (Signed)
 [facility] night cross cover note:  I was notified by RN that this patient is agitated, confused, aggressive towards staff, pulling at her telemetry leads , peripheral IV, and wound vac in addition to repeatedly attempting to get out of bed on her own, with these behaviors refractory to attempts at verbal redirection.  In the setting of associated interference with ongoing medical treatment posing potential harm to herself, I have placed order for haldol 5 mg iv x 1 dose now as well as order for geodon 20 mg IM x 1 dose prn for refractory agitation.     Eva Pore, DO Hospitalist

## 2024-03-26 NOTE — Progress Notes (Signed)
 Pharmacy Antibiotic Note  Katie Hawkins is a 73 y.o. female admitted on 03/21/2024 with left foot osteomyelitis s/p L first ray amputation 10/31. Per podiatry, patient to remain on IV antibiotics until discharge. Will not proceed with vanc level monitoring since will be discontinued on discharge. Pharmacy has been consulted for vancomycin dosing.  11/1 vanc assessment:  -1250 mg IV q24h -- AUC 491, Cmin 11.9, Scr 0.8  Plan: -Increase vancomycin to 1250 mg IV q24h -F/u renal function, temperature  Weight: 75.6 kg (166 lb 10.7 oz)  Temp (24hrs), Avg:98.4 F (36.9 C), Min:97.4 F (36.3 C), Max:98.9 F (37.2 C)  Recent Labs  Lab 03/21/24 1609 03/23/24 0314 03/24/24 0331 03/25/24 0330 03/26/24 0316  WBC 11.3* 11.5* 12.7* 16.9* 17.4*  CREATININE 0.71 0.69 0.61 0.66 0.62    Estimated Creatinine Clearance: 60.1 mL/min (by C-G formula based on SCr of 0.62 mg/dL).    No Active Allergies  Antimicrobials this admission: Vanc 10/27 >>  Zosyn 10/27 >>  Doxy 10/30 >>  Microbiology results: (None this admission) 10/16 bcx: NGF 10/17 MRSA PCR: positive  Thank you for allowing pharmacy to be a part of this patient's care.  Izetta Carl, PharmD PGY1 Pharmacy Resident

## 2024-03-26 NOTE — Progress Notes (Signed)
 Mobility Specialist Progress Note:   03/26/24 1128  Mobility  Activity Ambulated with assistance  Level of Assistance Contact guard assist, steadying assist  Assistive Device Front wheel walker  Distance Ambulated (ft) 150 ft  LLE Weight Bearing Per Provider Order WBAT  Activity Response Tolerated well  Mobility Referral Yes  Mobility visit 1 Mobility  Mobility Specialist Start Time (ACUTE ONLY) 1128  Mobility Specialist Stop Time (ACUTE ONLY) 1145  Mobility Specialist Time Calculation (min) (ACUTE ONLY) 17 min   Pt received in bed, eager for mobility session. Eager for d/c. Pt ambulated in hallway with RW, CGA for safety. No LOB. Tolerated well, asx throughout. Returned pt to room, dangling EOB, husband at bedside.   Katie Hawkins Mobility Specialist Please contact via Special Educational Needs Teacher or  Rehab office at [phone]

## 2024-03-26 NOTE — Progress Notes (Signed)
  Subjective:  Patient ID: Katie Hawkins, female    DOB: Nov 06, 1950,  MRN: [id number]  No chief complaint on file.   DOS: 03/25/2024 Procedure: Partial first ray amputation, left foot  73 y.o. female seen for post op check.  Patient reports she is doing well she is having minimal pain at this time.  We discussed findings from surgery as well as follow-up plans and aftercare  Review of Systems: Negative except as noted in the HPI. Denies N/V/F/Ch.   Objective:   Constitutional Well developed. Well nourished.  Vascular Foot warm and well perfused. Capillary refill normal to all digits.   No calf pain with palpation  Neurologic Normal speech. Oriented to person, place, and time. Epicritic sensation intact to left foot  Dermatologic Dressing clean dry and intact to left foot  Orthopedic: Status post partial first ray amputation   Radiographs: 1. Interval transmetatarsal amputation of the first digit, with expected postsurgical changes in the soft tissues.  Pathology: Pending  Micro: Pending  Assessment:   1. Critical lower limb ischemia (HCC)   2. Atherosclerosis of native artery of left lower extremity with gangrene (HCC)   3. 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  POD # 1 s/p partial first ray amputation left foot -Progressing as expected postoperatively.  Expect clean margin at amputation site.  Good bleeding seen intraoperatively. -XR: Expected postoperative changes -WB Status: Minimal weightbearing for transfers and bathroom privileges only in postop shoe  -Sutures: Remain intact 2 to 3 weeks. -Medications/ABX: Recommend 7 days p.o. doxycycline  and Augmentin on discharge -Dressing: Dressing remain clean dry and intact until follow-up  next week Thursday - F/u Plan: Stable for discharge from my standpoint when able to work with PT OT.  Transfer privileges only.  Leave dressing clean dry and intact until follow-up office call to arrange appointment will sign off.        Katie Hawkins, DPM [facility] / [facility]

## 2024-03-27 ENCOUNTER — Inpatient Hospital Stay (HOSPITAL_COMMUNITY)

## 2024-03-27 ENCOUNTER — Other Ambulatory Visit (HOSPITAL_COMMUNITY): Payer: Self-pay

## 2024-03-27 DIAGNOSIS — J9811 Atelectasis: Secondary | ICD-10-CM | POA: Diagnosis not present

## 2024-03-27 DIAGNOSIS — I70229 Atherosclerosis of native arteries of extremities with rest pain, unspecified extremity: Secondary | ICD-10-CM | POA: Diagnosis not present

## 2024-03-27 DIAGNOSIS — I700 Atherosclerosis of aorta: Secondary | ICD-10-CM | POA: Diagnosis not present

## 2024-03-27 DIAGNOSIS — J90 Pleural effusion, not elsewhere classified: Secondary | ICD-10-CM | POA: Diagnosis not present

## 2024-03-27 LAB — CBC WITH DIFFERENTIAL/PLATELET
Abs Immature Granulocytes: 0.07 K/uL (ref 0.00–0.07)
Basophils Absolute: 0.1 K/uL (ref 0.0–0.1)
Basophils Relative: 1 %
Eosinophils Absolute: 0.2 K/uL (ref 0.0–0.5)
Eosinophils Relative: 2 %
HCT: 31.7 % — ABNORMAL LOW (ref 36.0–46.0)
Hemoglobin: 10.1 g/dL — ABNORMAL LOW (ref 12.0–15.0)
Immature Granulocytes: 1 %
Lymphocytes Relative: 16 %
Lymphs Abs: 2.1 K/uL (ref 0.7–4.0)
MCH: 29.5 pg (ref 26.0–34.0)
MCHC: 31.9 g/dL (ref 30.0–36.0)
MCV: 92.7 fL (ref 80.0–100.0)
Monocytes Absolute: 1.2 K/uL — ABNORMAL HIGH (ref 0.1–1.0)
Monocytes Relative: 10 %
Neutro Abs: 9.1 K/uL — ABNORMAL HIGH (ref 1.7–7.7)
Neutrophils Relative %: 70 %
Platelets: 401 K/uL — ABNORMAL HIGH (ref 150–400)
RBC: 3.42 MIL/uL — ABNORMAL LOW (ref 3.87–5.11)
RDW: 13.1 % (ref 11.5–15.5)
WBC: 12.7 K/uL — ABNORMAL HIGH (ref 4.0–10.5)
nRBC: 0 % (ref 0.0–0.2)

## 2024-03-27 LAB — COMPREHENSIVE METABOLIC PANEL WITH GFR
ALT: 65 U/L — ABNORMAL HIGH (ref 0–44)
AST: 91 U/L — ABNORMAL HIGH (ref 15–41)
Albumin: 2.4 g/dL — ABNORMAL LOW (ref 3.5–5.0)
Alkaline Phosphatase: 78 U/L (ref 38–126)
Anion gap: 18 — ABNORMAL HIGH (ref 5–15)
BUN: 9 mg/dL (ref 8–23)
CO2: 16 mmol/L — ABNORMAL LOW (ref 22–32)
Calcium: 8.2 mg/dL — ABNORMAL LOW (ref 8.9–10.3)
Chloride: 96 mmol/L — ABNORMAL LOW (ref 98–111)
Creatinine, Ser: 0.72 mg/dL (ref 0.44–1.00)
GFR, Estimated: >60 mL/min (ref >60)
Glucose, Bld: 230 mg/dL — ABNORMAL HIGH (ref 70–99)
Potassium: 4 mmol/L (ref 3.5–5.1)
Sodium: 130 mmol/L — ABNORMAL LOW (ref 135–145)
Total Bilirubin: 0.9 mg/dL (ref 0.0–1.2)
Total Protein: 6.5 g/dL (ref 6.5–8.1)

## 2024-03-27 LAB — VITAMIN B12: Vitamin B-12: 2052 pg/mL — ABNORMAL HIGH (ref 180–914)

## 2024-03-27 LAB — GLUCOSE, CAPILLARY
Glucose-Capillary: 187 mg/dL — ABNORMAL HIGH (ref 70–99)
Glucose-Capillary: 274 mg/dL — ABNORMAL HIGH (ref 70–99)
Glucose-Capillary: 279 mg/dL — ABNORMAL HIGH (ref 70–99)
Glucose-Capillary: 233 mg/dL — ABNORMAL HIGH (ref 70–99)
Glucose-Capillary: 144 mg/dL — ABNORMAL HIGH (ref 70–99)
Glucose-Capillary: 148 mg/dL — ABNORMAL HIGH (ref 70–99)

## 2024-03-27 LAB — TSH: TSH: 2.392 u[IU]/mL (ref 0.350–4.500)

## 2024-03-27 LAB — FOLATE: Folate: 11.1 ng/mL (ref >5.9)

## 2024-03-27 MED ORDER — AMOXICILLIN-POT CLAVULANATE 875-125 MG PO TABS
1.0000 | ORAL_TABLET | Freq: Two times a day (BID) | ORAL | 0 refills | Status: AC
  Filled 2024-03-27: qty 14, 7d supply, fill #0

## 2024-03-27 MED ORDER — LACTATED RINGERS IV BOLUS
1000.0000 mL | Freq: Once | INTRAVENOUS | Status: AC
  Administered 2024-03-27: 1000 mL via INTRAVENOUS

## 2024-03-27 MED ORDER — IOHEXOL 350 MG/ML SOLN
75.0000 mL | Freq: Once | INTRAVENOUS | Status: AC | PRN
  Administered 2024-03-27: 75 mL via INTRAVENOUS

## 2024-03-27 MED ORDER — LACTATED RINGERS IV BOLUS
500.0000 mL | Freq: Once | INTRAVENOUS | Status: AC
  Administered 2024-03-27: 500 mL via INTRAVENOUS

## 2024-03-27 NOTE — Progress Notes (Addendum)
 Progress Note    03/27/2024 7:14 AM 2 Days Post-Op  Subjective:  confusion overnight; alert this am - eating breakfast.  Denies pain in her foot. Says it is better since amputation.  Wants to go home.   afebrile  Vitals:   03/26/24 2319 03/27/24 0300  BP: 91/68 100/87  Pulse: (!) 110   Resp: 20 13  Temp: 97.9 F (36.6 C) 98.8 F (37.1 C)  SpO2: 98% 96%    Physical Exam: General:  no distress Cardiac:  regular Lungs:  non labored Incisions:  left groin with prevena with good seal; left lower leg incision clean with steri strips in place.  Left foot bandage in place.   Extremities:  + left peroneal doppler flow  Abdomen:  soft  CBC    Component Value Date/Time   WBC 17.4 (H) 03/26/2024 0316   RBC 3.36 (L) 03/26/2024 0316   HGB 10.0 (L) 03/26/2024 0316   HGB 13.4 02/20/2014 1159   HCT 30.7 (L) 03/26/2024 0316   HCT 41.0 02/20/2014 1159   PLT 406 (H) 03/26/2024 0316   PLT 318 02/20/2014 1159   MCV 91.4 03/26/2024 0316   MCV 89.8 02/20/2014 1159   MCH 29.8 03/26/2024 0316   MCHC 32.6 03/26/2024 0316   RDW 13.0 03/26/2024 0316   RDW 13.6 02/20/2014 1159   LYMPHSABS 4.2 (H) 03/10/2024 0921   LYMPHSABS 1.7 02/20/2014 1159   MONOABS 1.4 (H) 03/10/2024 0921   MONOABS 0.8 02/20/2014 1159   EOSABS 0.1 03/10/2024 0921   EOSABS 0.1 02/20/2014 1159   BASOSABS 0.1 03/10/2024 0921   BASOSABS 0.1 02/20/2014 1159    BMET    Component Value Date/Time   NA 132 (L) 03/26/2024 0316   NA 140 02/20/2014 1159   K 3.5 03/26/2024 0316   K 3.7 02/20/2014 1159   CL 94 (L) 03/26/2024 0316   CL 103 08/11/2012 0838   CO2 22 03/26/2024 0316   CO2 27 02/20/2014 1159   GLUCOSE 189 (H) 03/26/2024 0316   GLUCOSE 176 (H) 02/20/2014 1159   GLUCOSE 165 (H) 08/11/2012 0838   BUN 9 03/26/2024 0316   BUN 16.6 02/20/2014 1159   CREATININE 0.62 03/26/2024 0316   CREATININE 0.7 02/20/2014 1159   CALCIUM  8.5 (L) 03/26/2024 0316   CALCIUM  10.0 02/20/2014 1159   GFRNONAA >60 03/26/2024  0316   GFRAA >60 11/22/2010 1515    INR    Component Value Date/Time   INR 1.0 03/23/2024 0314     Intake/Output Summary (Last 24 hours) at 03/27/2024 0714 Last data filed at 03/26/2024 2040 Gross per 24 hour  Intake 720 ml  Output --  Net 720 ml      Assessment/Plan:  73 y.o. female is s/p:  femoral to peroneal bypass with PTFE on 03/23/2024 by Dr. Magda and Partial first ray amputation, left foot on 03/25/2024 by Dr. Malvin.    -pt with + left peroneal doppler flow.  Prevena with good seal.  Discussed in detail about prevena vac and when it loses seal, it can be removed. Discussed that after prevena loses seal and vac can be removed and the importance of cleaning with soap and water daily and otherwise keeping it dry to prevent wound infections.  Stressed importance of this given synthetic bypass graft.  She and her husband expressed good understanding.   -pain in left foot improved sinct amputation.  Toe amp dressing to remain clean and dry and intact until f/u in his office per Dr. Malvin.  He reports good bleeding intra-op.  Minimal weight bearing and only in post op shoe -confusion overnight - received Haldol and Geodon x one dose -DVT prophylaxis:  Xarelto -ok for discharge from vascular standpoint.  Pt has f/u scheduled with [facility]   Lucie Apt, PA-C [facility] [phone] 03/27/2024 7:14 AM

## 2024-03-27 NOTE — Progress Notes (Signed)
 PROGRESS NOTE    Katie Hawkins  FMW:[id number] DOB: 10/06/1950 DOA: 03/21/2024 PCP: Claudene Pellet, MD  No chief complaint on file.   Brief Narrative:    73 yr old woman who was admitted to [facility] by vascular surgery due to concern for critical limb ischemia on 03/21/2024. She went for Zayla Agar follow up visit with vascular surgery on 03/20/2024 after  being inpatient 10/16/-10/17 for ischemia and early osteomyelitis on MRI.  The ulcer had worsened. The plan is for the patient to undergo angiography on 03/23/2024.    On 03/23/2024 after the procedure the patient is seen in PACU. She underwent Rafiq Bucklin left common femoral to peroneal artery bypass with Raheen Capili subfascial tunneling of 6 mm e PTFE She has tolerated the procedure well. Per vascular surgery the patient will be started on Ivory Maduro DOAC once her hemoglobin is stable. She will continue ASA and statin therapies. Will mobilize as possible. He will see the patient in 4 weeks with ABI and left lower extremity arterial duplex. Although vascular surgery is just watching the left foot at this point, there is [place] high likelihood for amputation of the great toe in the future.   This afternoon the patient has been noted to be unsteady on her feet. He husband is unsure about her judgement and her ability to remain safe on her feet at home.   On 03/25/2024 the patient's WBC count increased to over 23k. The foot was re-evaluated by podiatry and vascular surgery. The plan changed to amputation of the partial first ray of the left foot. Assessment & Plan:   Principal Problem:   Critical lower limb ischemia (HCC) Active Problems:   Type 2 diabetes mellitus with complication, without long-term current use of insulin  (HCC)   Hyperlipidemia associated with type 2 diabetes mellitus (HCC)   Hypertension associated with diabetes (HCC)   Obesity   Personal history of Hodgkin lymphoma   PAD (peripheral artery disease)   Ulcer of left foot (HCC)   Osteomyelitis of great toe of  left foot (HCC)  Sinus Tachycardia S1Q3T3 Pattern on EKG Unexplained sinus tachycardia Will trial bolus TSH pending  Pain seems adequately controlled.  No c/o LH with standing. Continue her PO metoprolol   EKG with sinus tach. S1Q3T3 pattern, T wave inversions in V3-V6.  T wave flattening in II, inversions in III and aVF.  Will r/o PE with CT PE protocol.   Critical Limb Ischemia PAD Osteomyelitis  Now s/p L common femoral to peroneal artery bypass 10/29 with vascular S/p partial first ray amputation, L foot 10/31 Vascular recommending 4 week follow up with ABI and LLE arterial duplex - continue aspirin  and doac therapy, continue statin.  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 Will minimize pain meds, reduce oxy dose Follow b12, TSH, folate - consider additional w/u if persistent Delirium precautions  Hypertension Metoprolol , lisinopril    Dyslipidemia Lipitor, zetia    T2DM SSI  GERD ppi    DVT prophylaxis: xarelto Code Status: full Family Communication: husband at bedside Disposition:   Status is: Inpatient Remains inpatient appropriate because: inpatient   Consultants:  Vascular podiatry  Procedures:   10/29  PROCEDURE:   Left common femoral to peroneal artery bypass with 6mm ePTFE in subfascial tunnel (CPT 64333)  10/31 Procedure:  1. Partial first ray amputation, left foot  Antimicrobials:  Anti-infectives (From admission, onward)    Start     Dose/Rate Route Frequency Ordered Stop   03/26/24 2200   amoxicillin-clavulanate (AUGMENTIN) 875-125 MG per tablet 1 tablet        1 tablet Oral Every 12 hours 03/26/24 1847     03/26/24 1630  vancomycin (VANCOREADY) IVPB 1250 mg/250 mL  Status:  Discontinued        1,250 mg 166.7 mL/hr over 90 Minutes Intravenous Every 24 hours 03/26/24 1018 03/26/24 1847   03/24/24 2200  doxycycline  (VIBRA -TABS) tablet 100 mg        100 mg Oral Every 12 hours 03/24/24 1508 03/31/24 2159   03/24/24 0000  doxycycline  (VIBRA -TABS) 100 MG tablet        100 mg Oral Every 12 hours 03/24/24 1526     03/23/24 1600  ceFAZolin  (ANCEF ) IVPB 2g/100 mL premix        2 g 200 mL/hr over 30 Minutes Intravenous Every 8 hours 03/23/24 1156 03/24/24 0023   03/23/24 0600  ceFAZolin  (ANCEF ) IVPB 2g/100 mL premix        2 g 200 mL/hr over 30 Minutes Intravenous 30 min pre-op  03/23/24 0238 03/23/24 0825   03/21/24 2200  piperacillin-tazobactam (ZOSYN) IVPB 3.375 g  Status:  Discontinued       Placed in Followed by Linked Group   3.375 g 12.5 mL/hr over 240 Minutes Intravenous Every 8 hours 03/21/24 1533 03/26/24 1847   03/21/24 1630  vancomycin (VANCOCIN) IVPB 1000 mg/200 mL premix  Status:  Discontinued        1,000 mg 200 mL/hr over 60 Minutes Intravenous Every 24 hours 03/21/24 1533 03/26/24 1018   03/21/24 1630  piperacillin-tazobactam (ZOSYN) IVPB 3.375 g       Placed in Followed by Linked Group   3.375 g 100 mL/hr over 30 Minutes Intravenous  Once 03/21/24 1533 03/21/24 1656       Subjective: Asking about going home Husband at bedside - he notes confusion  She's confused, adamant about her version of events (which is not correct)   Objective: Vitals:   03/27/24 0300 03/27/24 0813 03/27/24 0815 03/27/24 0928  BP: 100/87 (!) 89/55 92/60 107/74  Pulse:  (!) 105  (!) 117  Resp: 13 20 18 20   Temp: 98.8 F (37.1 C) 98.1 F (36.7 C)  99.1 F (37.3 C)  TempSrc: Axillary Oral    SpO2: 96% 91%  97%  Weight:        Intake/Output Summary (Last 24 hours) at  03/27/2024 1032 Last data filed at 03/27/2024 0900 Gross per 24 hour  Intake 720 ml  Output --  Net 720 ml   Filed Weights   03/24/24 0505  Weight: 75.6 kg    Examination:  General exam: Appears calm and comfortable  Respiratory system: unlabored Cardiovascular system: RRR Central nervous system: alert - confused, moving all extremities Extremities: L foot with dressing intact    Data Reviewed: I have personally reviewed following labs and imaging studies  CBC: Recent Labs  Lab 03/23/24 0314 03/24/24 0331 03/25/24 0330 03/26/24 0316 03/27/24 0811  WBC 11.5* 12.7* 16.9* 17.4* 12.7*  NEUTROABS  --   --   --   --  9.1*  HGB 11.1* 10.0* 10.5* 10.0* 10.1*  HCT 34.0* 29.9* 32.6* 30.7* 31.7*  MCV 92.1 91.4 92.6 91.4 92.7  PLT 426* 409* 391 406* 401*    Basic Metabolic Panel: Recent Labs  Lab 03/21/24 1609 03/23/24 0314 03/24/24 0331 03/25/24 0330 03/26/24 0316  NA 135 134* 134* 133* 132*  K 4.5 4.4 3.6 3.2* 3.5  CL 98 97* 97* 94* 94*  CO2 26 28 23 24 22   GLUCOSE 269* 240* 177* 198* 189*  BUN 16 11 8 9 9   CREATININE 0.71 0.69 0.61 0.66 0.62  CALCIUM  8.8* 9.2 8.3* 8.9 8.5*    GFR: Estimated Creatinine Clearance: 60.1 mL/min (by C-G formula based on SCr of 0.62 mg/dL).  Liver Function Tests: Recent Labs  Lab 03/21/24 1609 03/23/24 0314  AST 16 17  ALT 19 18  ALKPHOS 56 52  BILITOT 0.4 0.4  PROT 6.2* 6.5  ALBUMIN 2.7* 2.8*    CBG: Recent Labs  Lab 03/26/24 0716 03/26/24 1104 03/26/24 1504 03/26/24 2133 03/27/24 0631  GLUCAP 208* 225* 204* 218* 187*     No results found for this or any previous visit (from the past 240 hours).       Radiology Studies: DG Foot 2 Views Left Result Date: 03/25/2024 CLINICAL DATA:  Postop left first digit amputation EXAM: DG FOOT 2V*L* COMPARISON:  03/10/2024 FINDINGS: Frontal and lateral views of the left foot demonstrate interval transmetatarsal amputation of the first digit. Prior third digit amputation  again noted. Prior healed fifth metatarsal fracture. No other acute bony abnormalities. Postsurgical changes in the soft tissues of the first digit. IMPRESSION: 1. Interval transmetatarsal amputation of the first digit, with expected postsurgical changes in the soft tissues. Electronically Signed   By: Ozell Daring M.D.   On: 03/25/2024 21:28        Scheduled Meds:  acetaminophen   1,000 mg Oral Q8H   amoxicillin-clavulanate  1 tablet Oral Q12H   aspirin  EC  81 mg Oral Daily   atorvastatin  40 mg Oral QHS   clopidogrel   75 mg Oral Daily   doxycycline   100 mg Oral Q12H   ezetimibe   10 mg Oral Daily   insulin  aspart  0-15 Units Subcutaneous TID WC   insulin  aspart  0-5 Units Subcutaneous QHS   insulin  glargine-yfgn  15 Units Subcutaneous Daily   lisinopril   10 mg Oral Daily   metoprolol  succinate  25 mg Oral Daily   pantoprazole   40 mg Oral Daily   rivaroxaban  2.5 mg Oral BID   sodium chloride  flush  3 mL Intravenous Q12H   sodium chloride  flush  3 mL Intravenous Q12H   traZODone  50 mg Oral QHS   Continuous Infusions:  sodium chloride        LOS: 6 days    Time spent: over 30 min     Meliton Monte, MD [facility]   To contact the attending provider between 7A-7P or the covering provider during after hours 7P-7A, please log into the web site [email or web address] and access using universal [place] password for that web site. If you do not have the password, please call the hospital operator.  03/27/2024, 10:32 AM

## 2024-03-27 NOTE — Anesthesia Postprocedure Evaluation (Signed)
 Anesthesia Post Note  Patient: Katie Hawkins  Procedure(s) Performed: Partial ray amputation, left great toe (Left: Toe)     Patient location during evaluation: PACU Anesthesia Type: MAC Level of consciousness: awake Pain management: pain level controlled Vital Signs Assessment: post-procedure vital signs reviewed and stable Respiratory status: spontaneous breathing Cardiovascular status: blood pressure returned to baseline Postop Assessment: no apparent nausea or vomiting Anesthetic complications: no   No notable events documented.              Lauraine KATHEE [place]

## 2024-03-27 NOTE — Progress Notes (Signed)
 Mobility Specialist Progress Note:    03/27/24 1022  Mobility  Activity Ambulated with assistance  Level of Assistance Contact guard assist, steadying assist  Assistive Device Front wheel walker  Distance Ambulated (ft) 200 ft  LLE Weight Bearing Per Provider Order WBAT  Activity Response Tolerated well  Mobility Referral Yes  Mobility visit 1 Mobility  Mobility Specialist Start Time (ACUTE ONLY) 1022  Mobility Specialist Stop Time (ACUTE ONLY) 1032  Mobility Specialist Time Calculation (min) (ACUTE ONLY) 10 min   Pt received in chair. Agreeable to ambulate in hallways. Tolerated well, much improvement with stability and safety awareness compared to first session. Returned pt to room, sitting up in chair with all needs met. Call bell in reach. RN notified.   Noma Quijas Mobility Specialist Please contact via Special Educational Needs Teacher or  Rehab office at [phone]

## 2024-03-27 NOTE — Plan of Care (Signed)

## 2024-03-27 NOTE — Progress Notes (Signed)
 MEWS Progress Note  Patient Details Name: Katie Hawkins MRN: [id number] DOB: 11-22-1950 Today's Date: 03/27/2024   MEWS Flowsheet Documentation:  Assess: MEWS Score Temp: 99.1 F (37.3 C) BP: 107/74 MAP (mmHg): 85 Pulse Rate: (!) 117 ECG Heart Rate: (!) 117 Resp: 20 Level of Consciousness: Alert SpO2: 97 % O2 Device: Room Air Patient Activity (if Appropriate): In bed O2 Flow Rate (L/min): 0 L/min Assess: MEWS Score MEWS Temp: 0 MEWS Systolic: 0 MEWS Pulse: 2 MEWS RR: 0 MEWS LOC: 0 MEWS Score: 2 MEWS Score Color: Yellow Assess: SIRS CRITERIA SIRS Temperature : 0 SIRS Respirations : 0 SIRS Pulse: 1 SIRS WBC: 0 SIRS Score Sum : 1 SIRS Temperature : 0 SIRS Pulse: 1 SIRS Respirations : 0 SIRS WBC: 0 SIRS Score Sum : 1 Notify: Charge Nurse/RN Name of Charge Nurse/RN Notified: Rocky Laneta JAYSON Rennie and Landry, RN 03/27/2024, 10:27 AM

## 2024-03-28 ENCOUNTER — Other Ambulatory Visit (HOSPITAL_COMMUNITY): Payer: Self-pay

## 2024-03-28 ENCOUNTER — Inpatient Hospital Stay (HOSPITAL_COMMUNITY)

## 2024-03-28 ENCOUNTER — Other Ambulatory Visit: Payer: Self-pay

## 2024-03-28 DIAGNOSIS — I70229 Atherosclerosis of native arteries of extremities with rest pain, unspecified extremity: Secondary | ICD-10-CM | POA: Diagnosis not present

## 2024-03-28 DIAGNOSIS — R008 Other abnormalities of heart beat: Secondary | ICD-10-CM | POA: Diagnosis not present

## 2024-03-28 LAB — CBC WITH DIFFERENTIAL/PLATELET
Abs Immature Granulocytes: 0.04 K/uL (ref 0.00–0.07)
Basophils Absolute: 0 K/uL (ref 0.0–0.1)
Basophils Relative: 1 %
Eosinophils Absolute: 0.2 K/uL (ref 0.0–0.5)
Eosinophils Relative: 3 %
HCT: 27.8 % — ABNORMAL LOW (ref 36.0–46.0)
Hemoglobin: 9 g/dL — ABNORMAL LOW (ref 12.0–15.0)
Immature Granulocytes: 1 %
Lymphocytes Relative: 22 %
Lymphs Abs: 1.8 K/uL (ref 0.7–4.0)
MCH: 29.7 pg (ref 26.0–34.0)
MCHC: 32.4 g/dL (ref 30.0–36.0)
MCV: 91.7 fL (ref 80.0–100.0)
Monocytes Absolute: 0.8 K/uL (ref 0.1–1.0)
Monocytes Relative: 10 %
Neutro Abs: 5.1 K/uL (ref 1.7–7.7)
Neutrophils Relative %: 63 %
Platelets: 374 K/uL (ref 150–400)
RBC: 3.03 MIL/uL — ABNORMAL LOW (ref 3.87–5.11)
RDW: 13 % (ref 11.5–15.5)
WBC: 8 K/uL (ref 4.0–10.5)
nRBC: 0 % (ref 0.0–0.2)

## 2024-03-28 LAB — COMPREHENSIVE METABOLIC PANEL WITH GFR
ALT: 96 U/L — ABNORMAL HIGH (ref 0–44)
AST: 97 U/L — ABNORMAL HIGH (ref 15–41)
Albumin: 2.2 g/dL — ABNORMAL LOW (ref 3.5–5.0)
Alkaline Phosphatase: 61 U/L (ref 38–126)
Anion gap: 13 (ref 5–15)
BUN: 5 mg/dL — ABNORMAL LOW (ref 8–23)
CO2: 22 mmol/L (ref 22–32)
Calcium: 8.3 mg/dL — ABNORMAL LOW (ref 8.9–10.3)
Chloride: 100 mmol/L (ref 98–111)
Creatinine, Ser: 0.55 mg/dL (ref 0.44–1.00)
GFR, Estimated: >60 mL/min (ref >60)
Glucose, Bld: 182 mg/dL — ABNORMAL HIGH (ref 70–99)
Potassium: 3.5 mmol/L (ref 3.5–5.1)
Sodium: 135 mmol/L (ref 135–145)
Total Bilirubin: 0.7 mg/dL (ref 0.0–1.2)
Total Protein: 5.7 g/dL — ABNORMAL LOW (ref 6.5–8.1)

## 2024-03-28 LAB — GLUCOSE, CAPILLARY
Glucose-Capillary: 171 mg/dL — ABNORMAL HIGH (ref 70–99)
Glucose-Capillary: 222 mg/dL — ABNORMAL HIGH (ref 70–99)
Glucose-Capillary: 149 mg/dL — ABNORMAL HIGH (ref 70–99)
Glucose-Capillary: 126 mg/dL — ABNORMAL HIGH (ref 70–99)

## 2024-03-28 LAB — ECHOCARDIOGRAM COMPLETE
Area-P 1/2: 5.06 cm2
MV VTI: 1.33 cm2
S' Lateral: 2.7 cm
Weight: 2666.68 [oz_av]

## 2024-03-28 LAB — MAGNESIUM: Magnesium: 1.6 mg/dL — ABNORMAL LOW (ref 1.7–2.4)

## 2024-03-28 LAB — PHOSPHORUS: Phosphorus: 3.1 mg/dL (ref 2.5–4.6)

## 2024-03-28 MED ORDER — MAGNESIUM SULFATE 2 GM/50ML IV SOLN
2.0000 g | Freq: Once | INTRAVENOUS | Status: AC
  Administered 2024-03-28: 2 g via INTRAVENOUS
  Filled 2024-03-28: qty 50

## 2024-03-28 MED ORDER — ZIPRASIDONE MESYLATE 20 MG IM SOLR
20.0000 mg | Freq: Once | INTRAMUSCULAR | Status: AC | PRN
  Administered 2024-03-28: 20 mg via INTRAMUSCULAR
  Filled 2024-03-28: qty 20

## 2024-03-28 MED ORDER — STERILE WATER FOR INJECTION IJ SOLN
INTRAMUSCULAR | Status: AC
  Administered 2024-03-28: 10 mL
  Filled 2024-03-28: qty 10

## 2024-03-28 MED ORDER — METOPROLOL TARTRATE 25 MG PO TABS
25.0000 mg | ORAL_TABLET | Freq: Two times a day (BID) | ORAL | Status: DC
  Administered 2024-03-28: 25 mg via ORAL
  Filled 2024-03-28: qty 1

## 2024-03-28 MED ORDER — METOPROLOL TARTRATE 25 MG PO TABS
25.0000 mg | ORAL_TABLET | Freq: Two times a day (BID) | ORAL | 0 refills | Status: AC
  Filled 2024-03-28: qty 60, 30d supply, fill #0

## 2024-03-28 MED ORDER — POTASSIUM CHLORIDE CRYS ER 20 MEQ PO TBCR
40.0000 meq | EXTENDED_RELEASE_TABLET | Freq: Once | ORAL | Status: AC
  Administered 2024-03-28: 40 meq via ORAL
  Filled 2024-03-28: qty 2

## 2024-03-28 MED ORDER — HALOPERIDOL LACTATE 5 MG/ML IJ SOLN
5.0000 mg | Freq: Four times a day (QID) | INTRAMUSCULAR | Status: DC | PRN
Start: 2024-03-28 — End: 2024-03-28
  Administered 2024-03-28: 5 mg via INTRAVENOUS
  Filled 2024-03-28: qty 1

## 2024-03-28 NOTE — Inpatient Diabetes Management (Signed)
 Inpatient Diabetes Program Recommendations  AACE/ADA: New Consensus Statement on Inpatient Glycemic Control   Target Ranges:  Prepandial:   less than 140 mg/dL      Peak postprandial:   less than 180 mg/dL (1-2 hours)      Critically ill patients:  140 - 180 mg/dL   Lab Results  Component Value Date   GLUCAP 171 (H) 03/28/2024   HGBA1C 8.6 (H) 03/11/2024    Latest Reference Range & Units 03/27/24 06:31 03/27/24 13:02 03/27/24 13:14 03/27/24 15:43 03/27/24 20:27 03/27/24 21:01 03/28/24 06:10  Glucose-Capillary 70 - 99 mg/dL 812 (H) 720 (H) 725 (H) 233 (H) 144 (H) 148 (H) 171 (H)   Review of Glycemic Control  Diabetes history: DM2  Outpatient Diabetes medications:  Amaryl 2mg  at bedtime  Metformin  500mg  BID   Current orders for Inpatient glycemic control:  Semglee 15 units daily  Novolog  0-15 units TID + 0-5 units at bedtime   Inpatient Diabetes Program Recommendations:   Please consider starting Novolog  3 units TID with meals (if patient eat atleats 50% of meal).   Thanks,  Lavanda Search, RN, MSN, [facility]  Inpatient Diabetes Coordinator  Pager [phone] (8a-5p)

## 2024-03-28 NOTE — Progress Notes (Signed)
 [facility] night cross cover note:   While dose of IV Haldol and dose of IM Geodon appear to have helped initially with the patient's agitation, she is now exhibiting additional agitation, confusion, with RN also conveying that the patient is appearing to exhibit evidence of visual and auditory hallucinations, repeatedly attempting to get out of bed independently, and is also tempting to drink from her IV site, with these actions/behaviors refractory to attempts at verbal redirection.  As the patient's actions cardioinhibitory and provision of medical care and his increased risk for her, evaluated additional pharmacologic intervention at this time, which includes ongoing order for Haldol 5 mg IV every 6 hours as needed for agitation along with an additional one-time as needed dose of 20 mg of IM Geodon for refractory agitation.    Eva Pore, DO Hospitalist

## 2024-03-28 NOTE — Progress Notes (Signed)
 Occupational Therapy Treatment Patient Details Name: Katie Hawkins MRN: [id number] DOB: December 23, 1950 Today's Date: 03/28/2024   History of present illness 73 y.o. 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 underwent L femoral to peroneal bypass with PTFE. 10/31 Partial first ray amputation.  PMH of PAD, limb ischemia, chronic foot ulcer, obesity, hypertension, hyperlipidemia, diabetes, Hodgkin lymphoma.   OT comments  Pt presented in bed with husband present throughout the session. At the start of session she was reaching out for items and reporting she was dropping her pills (no pills present). She required min assist for supine to sit and complete light UE bath and dressing with CGA to min assist with cues on sequencing. She then agreed to go to chair with CGA-min assist for sit to stand to RW and CGA to min assist to direct pt to chair. She then was presented with breakfast. At this time pt's husband agreeable to [facility] therapy at this time.       If plan is discharge home, recommend the following:  A little help with walking and/or transfers;A little help with bathing/dressing/bathroom;Assistance with cooking/housework;Supervision due to cognitive status   Equipment Recommendations  Tub/shower seat;Other (comment) (RW)    Recommendations for Other Services      Precautions / Restrictions Precautions Precautions: Fall Recall of Precautions/Restrictions: Impaired Restrictions Weight Bearing Restrictions Per Provider Order: Yes LLE Weight Bearing Per Provider Order: Weight bearing as tolerated Other Position/Activity Restrictions: in post op shoe       Mobility Bed Mobility Overal bed mobility: Needs Assistance Bed Mobility: Supine to Sit     Supine to sit: Contact guard, Min assist     General bed mobility comments: to assist to bring B hips to EOB    Transfers Overall transfer level: Needs assistance Equipment used: Rolling walker (2  wheels) Transfers: Sit to/from Stand Sit to Stand: Contact guard assist           General transfer comment: cues for hand position     Balance Overall balance assessment: Needs assistance Sitting-balance support: Feet supported Sitting balance-Leahy Scale: Good     Standing balance support: Bilateral upper extremity supported Standing balance-Leahy Scale: Fair                             ADL either performed or assessed with clinical judgement   ADL Overall ADL's : Needs assistance/impaired Eating/Feeding: Set up;Minimal assistance;Sitting Eating/Feeding Details (indicate cue type and reason): occasionaly will understoot/overshoot but also will start to close eyes Grooming: Wash/dry face;Wash/dry hands;Contact guard assist;Minimal assistance;Sitting   Upper Body Bathing: Contact guard assist;Minimal assistance;Sitting   Lower Body Bathing: Minimal assistance;Moderate assistance;Sit to/from stand   Upper Body Dressing : Contact guard assist;Minimal assistance;Sitting   Lower Body Dressing: Minimal assistance;Moderate assistance;Sit to/from stand Lower Body Dressing Details (indicate cue type and reason): max for boot to ensure propr position Toilet Transfer: Cueing for safety;Cueing for sequencing;Contact guard assist;Minimal assistance;Rolling walker (2 wheels)           Functional mobility during ADLs: Contact guard assist;Minimal assistance;Rolling walker (2 wheels);Cueing for sequencing;Cueing for safety      Extremity/Trunk Assessment Upper Extremity Assessment Upper Extremity Assessment: Generalized weakness (occasional have ataxic movements)   Lower Extremity Assessment Lower Extremity Assessment: Defer to PT evaluation        Vision       Perception     Praxis     Communication Communication  Communication: No apparent difficulties   Cognition Arousal: Alert Behavior During Therapy: Impulsive Cognition: Cognition impaired    Orientation impairments: Situation, Time, Place Awareness: Online awareness impaired Memory impairment (select all impairments): Short-term memory, Working civil service fast streamer, Engineer, structural memory Attention impairment (select first level of impairment): Focused attention, Sustained attention, Selective attention, Alternating attention, Divided attention Executive functioning impairment (select all impairments): Organization, Sequencing, Reasoning, Problem solving                   Following commands: Impaired Following commands impaired: Follows one step commands inconsistently      Cueing   Cueing Techniques: Verbal cues, Gestural cues, Tactile cues  Exercises      Shoulder Instructions       General Comments HR 120s-144 with ADLS at EOB and transfer to chair    Pertinent Vitals/ Pain       Pain Assessment Pain Assessment: No/denies pain Pain Score: 0-No pain Pain Intervention(s): Monitored during session  Home Living                                          Prior Functioning/Environment              Frequency  Min 2X/week        Progress Toward Goals  OT Goals(current goals can now be found in the care plan section)  Progress towards OT goals: Progressing toward goals  Acute Rehab OT Goals Patient Stated Goal: to go home OT Goal Formulation: With patient Time For Goal Achievement: 04/07/24 Potential to Achieve Goals: Good ADL Goals Pt Will Perform Grooming: with supervision;standing Pt Will Perform Lower Body Dressing: with supervision;sit to/from stand Pt Will Transfer to Toilet: with supervision;ambulating;regular height toilet Pt Will Perform Toileting - Clothing Manipulation and hygiene: with supervision;sit to/from stand Additional ADL Goal #1: pt will complete way pathfinding test at supervision level to improve functional mobility and cog  Plan      Co-evaluation                 AM-PAC OT 6 Clicks Daily Activity      Outcome Measure   Help from another person eating meals?: A Little Help from another person taking care of personal grooming?: A Little Help from another person toileting, which includes using toliet, bedpan, or urinal?: A Little Help from another person bathing (including washing, rinsing, drying)?: A Little Help from another person to put on and taking off regular upper body clothing?: A Little Help from another person to put on and taking off regular lower body clothing?: A Little 6 Click Score: 18    End of Session Equipment Utilized During Treatment: Gait belt;Rolling walker (2 wheels)  OT Visit Diagnosis: Unsteadiness on feet (R26.81);Other abnormalities of gait and mobility (R26.89);Muscle weakness (generalized) (M62.81)   Activity Tolerance Patient tolerated treatment well   Patient Left in chair;with call bell/phone within reach (pt's husband present in room and requesting to keep chair alarm off)   Nurse Communication Mobility status        Time: 9261-9181 OT Time Calculation (min): 40 min  Charges: OT General Charges $OT Visit: 1 Visit OT Treatments $Self Care/Home Management : 38-52 mins  Warrick POUR OTR/L  Acute Rehab Services  [phone] office number   Warrick Berber 03/28/2024, 8:29 AM

## 2024-03-28 NOTE — Discharge Summary (Signed)
 Physician Discharge Summary  Katie Hawkins FMW:[id number] DOB: 1950-11-25 DOA: 03/21/2024  PCP: Claudene Pellet, MD  Admit date: 03/21/2024 Discharge date: 03/28/2024  Time spent: 40 minutes  Recommendations for Outpatient Follow-up:  Follow outpatient CBC/CMP  Follow with podiatry outpatient Follow with vascular surgery outpatient Cardiology referral for moderate mitral stenosis and her unexplained sinus tach Expect delirium to improve at home, follow Follow BP outpatient, metoprolol  increased (lisinopril  held) Follow BG outpatient, note importance of BG control with wound healing    Discharge Diagnoses:  Principal Problem:   Critical lower limb ischemia (HCC) Active Problems:   Type 2 diabetes mellitus with complication, without long-term current use of insulin  (HCC)   Hyperlipidemia associated with type 2 diabetes mellitus (HCC)   Hypertension associated with diabetes (HCC)   Obesity   Personal history of Hodgkin lymphoma   PAD (peripheral artery disease)   Ulcer of left foot (HCC)   Osteomyelitis of great toe of left foot (HCC)   Discharge Condition: stable  Diet recommendation: heart healthy, diabetic  Filed Weights   03/24/24 0505  Weight: 75.6 kg    History of present illness:   73 yr old woman who was admitted to [facility] by vascular surgery due to concern for critical limb ischemia on 03/21/2024  She's now s/p L common femoral to peroneal artery bypass on 10/29 with vascular.  On 10/31, she had partial first ray amputation.    Plan for discharge on aspirin /xarelto.  7 days augmentin/doxy.  Outpatient follow up with podiatry and vascular.   Hospitalization complicated by hospital delirium which is improving.  Also, unexplained sinus tachycardia improved with metoprolol  after extensive workup unrevealing for cause.   Husband is declining home health.  Hospital Course:  Assessment and Plan:  Critical Limb Ischemia PAD Osteomyelitis  Now s/p L common  femoral to peroneal artery bypass 10/29 with vascular S/p partial first ray amputation, L foot 10/31 Vascular recommending 4 week follow up with ABI and LLE arterial duplex - continue aspirin  and doac therapy, continue statin.  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 Will minimize pain meds, reduce oxy dose Follow b12 (elevated), TSH (wnl), folate (wnl) Required haldol/geodon the past couple of nights, but she's less confused today (and yesterday during the day) - I think she'll do much better at home (husband thinks she's improving).  Delirium precautions   Sinus Tachycardia Unexplained sinus tachycardia - seemed to improve temporarily with IVF bolus yesterday, but back to 120-130 today while sitting up in chair - extensive w/u unrevealing of cause.  Improved at time of discharge with increased dose of metoprolol   TSH wnl  Pain seems adequately controlled.  No c/o LH with standing.  No CP.  She's asymptomatic.   CT PE protocol without PE Echo with EF 55-60%, no RWMA, grade II diastolic dysfunction - moderate mitral stenosis (follow with cardiology outpatient) Will increase PO metop to 25 mg BID and follow HR (given sinus tach, not trying to completely normalize) EKG with sinus tach. S1Q3T3 pattern, T wave inversions in V3-V6 resolved today.  T wave flattening in II, inversions  in III and aVF.    Hypertension Metoprolol  - increased to 25 mg twice daily Stop lisinopril    Dyslipidemia Lipitor, zetia     T2DM A1c 8.6 Resume glimeperide and  metformin  at home - follow BG with PCP outpatient, adjust regimen as needed   GERD ppi    Procedures: 10/29  PROCEDURE:   Left common femoral to peroneal artery bypass with 6mm ePTFE in subfascial tunnel (CPT 64333)   10/31 Procedure:  1. Partial first ray amputation, left foot   Echo IMPRESSIONS     1. Left ventricular ejection fraction, by estimation, is 55 to 60%. The  left ventricle has normal function. The left ventricle has no regional  wall motion abnormalities. Left ventricular diastolic parameters are  consistent with Grade II diastolic  dysfunction (pseudonormalization).   2. Right ventricular systolic function is normal. The right ventricular  size is normal.   3. The mitral valve is normal in structure. No evidence of mitral valve  regurgitation. Moderate mitral stenosis. The mean mitral valve gradient is  8.0 mmHg. Severe mitral annular calcification.   4. The aortic valve is normal in structure. There is moderate  calcification of the aortic valve. Aortic valve regurgitation is not  visualized. No aortic stenosis is present.   5. The inferior vena cava is normal in size with <50% respiratory  variability, suggesting right atrial pressure of 8 mmHg.   Consultations: Vascular podiatry  Discharge Exam: Vitals:   03/28/24 0922 03/28/24 1300  BP: (!) 117/52 (!) 87/50  Pulse: (!) 121   Resp:  16  Temp: 97.7 F (36.5 C) 98.1 F (36.7 C)  SpO2:  97%  Repeat vitals per RN 2:49 PM BP 124/85, HR 99  See progress note.  Discussed discharge plan with husband over the phone.   Discharge Instructions   Discharge Instructions     Activity as tolerated - No restrictions   Complete by: As directed    Call MD for:  redness, tenderness, or signs of infection (pain, swelling, redness, odor or green/yellow discharge around incision site)   Complete by: As directed    Call MD for:  temperature >100.4   Complete by: As directed    Diet - low sodium heart  healthy   Complete by: As directed    Diet - low sodium heart healthy   Complete by: As directed    Diet Carb Modified   Complete by: As directed    Discharge instructions   Complete by: As directed    Discharge to home with Phebe Dettmer 4 wheeled walked and home health PT/OT Wound care as per vascular surgery. Follow up with PCP in 7-10 days. Follow up with vascular surgery in 4 weeks.   Discharge instructions   Complete by: As directed    Discharge instructions   Complete by: As directed    You were seen for peripheral artery disease.    You had Dang Mathison bypass by vascular surgery.  This was followed by an amputation of your first toe by podiatry.  You'll need follow up in 4 weeks with vascular.  You'll continue aspirin  and xarelto outpatient.  Stop plavix .  We'll send you home with another week days of antibiotics.  Your heart rate is fast, it's not clear to me why.  We did an extensive workup without Amron Guerrette clear cause.  We'll send you home with an increased dose of metoprolol  (take 25 mg twice Daisa Stennis day).  You should follow up with cardiology as an outpatient.  Since we're increasing your metoprolol , we'll stop your lisinopril .   Minimal weightbearing for transfers and bathroom privileges only in postop shoe.   Return for new, recurrent, or worsening symptoms.  Please ask your PCP to request records from this hospitalization so they know what was done and what the next steps will be.   Discharge wound care:   Complete by: As directed    Wound vac as per vascular surgery. Wound care as per vascular surgery.   Discharge wound care:   Complete by: As directed    Per  podiatry, leave dressing clean, dry, and intact until follow up   Increase activity slowly   Complete by: As directed    Increase activity slowly   Complete by: As directed       Allergies as of 03/28/2024   No Active Allergies      Medication List     STOP taking these medications    clopidogrel  75 MG tablet Commonly known  as: PLAVIX    HYDROcodone -acetaminophen  5-325 MG tablet Commonly known as: NORCO/VICODIN   lisinopril  10 MG tablet Commonly known as: ZESTRIL    metoprolol  succinate 25 MG 24 hr tablet Commonly known as: TOPROL -XL       TAKE these medications    amoxicillin-clavulanate 875-125 MG tablet Commonly known as: AUGMENTIN Take 1 tablet by mouth every 12 (twelve) hours for 7 days.   aspirin  EC 81 MG tablet Take 1 tablet (81 mg total) by mouth daily. Swallow whole.   atorvastatin 40 MG tablet Commonly known as: LIPITOR Take 1 tablet (40 mg total) by mouth at bedtime.   doxycycline  100 MG tablet Commonly known as: VIBRA -TABS Take 1 tablet (100 mg total) by mouth every 12 (twelve) hours.   DRY EYE RELIEF DROPS OP Place 1 drop into both eyes daily as needed (dry eye).   ezetimibe  10 MG tablet Commonly known as: ZETIA  Take 10 mg by mouth daily.   glimepiride 2 MG tablet Commonly known as: AMARYL Take 2 mg by mouth at bedtime.   metFORMIN  500 MG 24 hr tablet Commonly known as: GLUCOPHAGE -XR Take 500 mg by mouth 2 (two) times daily.   metoprolol  tartrate 25 MG tablet Commonly known as: LOPRESSOR  Take 1 tablet (25 mg total) by mouth 2 (two) times daily.   MULTIVITAMIN GUMMIES ADULT PO Take 2 tablets by mouth daily.   oxyCODONE -acetaminophen  5-325 MG tablet Commonly known as: PERCOCET/ROXICET Take 1 tablet by mouth every 6 (six) hours as needed for moderate pain (pain score 4-6).   Xarelto 2.5 MG Tabs tablet Generic drug: rivaroxaban Take 1 tablet (2.5 mg total) by mouth 2 (two) times daily.               Durable Medical Equipment  (From admission, onward)           Start     Ordered   03/24/24 1518  DME Walker  Once       Question Answer Comment  Walker: With 5 Inch Wheels   Patient needs Tavarus [place] walker to treat with the following condition Ambulatory dysfunction      03/24/24 1526              Discharge Care Instructions  (From admission, onward)            Start     Ordered   03/28/24 0000  Discharge wound care:       Comments: Per  podiatry, leave dressing clean, dry, and intact until follow up  03/28/24 1443   03/24/24 0000  Discharge wound care:       Comments: Wound vac as per vascular surgery. Wound care as per vascular surgery.   03/24/24 1526           No Active Allergies  Follow-up Information     [facility]. of [facility] Follow up in 3 week(s).   Specialty: Vascular Surgery Contact information: [address] [place]  [address] [phone]                 The results of significant diagnostics from this hospitalization (including imaging, microbiology, ancillary and laboratory) are listed below for reference.    Significant Diagnostic Studies: ECHOCARDIOGRAM COMPLETE Result Date: 03/28/2024    ECHOCARDIOGRAM REPORT   Patient Name:   Pattie L Sadek Date of Exam: 03/28/2024 Medical Rec #:  [id number]        Height:       63.0 in Accession #:    [id number]       Weight:       166.7 lb Date of Birth:  03/18/1951        BSA:          1.789 m Patient Age:    73 years         BP:           117/52 mmHg Patient Gender: F                HR:           109 bpm. Exam Location:  Inpatient Procedure: 2D Echo, Cardiac Doppler and Color Doppler (Both Spectral and Color            Flow Doppler were utilized during procedure). Indications:    Other abnormalities of the heart R00.8  History:        Patient has no prior history of Echocardiogram examinations.                 PAD, Arrythmias:Tachycardia; Risk Factors:Diabetes, Dyslipidemia                 and Hypertension.  Sonographer:    Koleen Popper RDCS Referring Phys: [phone] Amil Moseman CALDWELL POWELL JR IMPRESSIONS  1. Left ventricular ejection fraction, by estimation, is 55 to 60%. The left ventricle has normal function. The left ventricle has no regional wall motion abnormalities. Left ventricular  diastolic parameters are consistent with Grade II diastolic dysfunction (pseudonormalization).  2. Right ventricular systolic function is normal. The right ventricular size is normal.  3. The mitral valve is normal in structure. No evidence of mitral valve regurgitation. Moderate mitral stenosis. The mean mitral valve gradient is 8.0 mmHg. Severe mitral annular calcification.  4. The aortic valve is normal in structure. There is moderate calcification of the aortic valve. Aortic valve regurgitation is not visualized. No aortic stenosis is present.  5. The inferior vena cava is normal in size with <50% respiratory variability, suggesting right atrial pressure of 8 mmHg. FINDINGS  Left Ventricle: Left ventricular ejection fraction, by estimation, is 55 to 60%. The left ventricle has normal function. The left ventricle has no regional wall motion abnormalities. The left ventricular internal cavity size was normal in size. There is  no left ventricular hypertrophy. Left ventricular diastolic function could not be evaluated due to mitral annular calcification (moderate or greater). Left ventricular diastolic parameters are consistent with Grade II diastolic  dysfunction (pseudonormalization). Right Ventricle: The right ventricular size is normal. No increase in right ventricular wall thickness. Right ventricular systolic function is normal. Left Atrium: Left atrial size was normal in size. Right Atrium: Right atrial size was normal in size. Pericardium: There is no evidence of pericardial effusion. Mitral Valve: The mitral valve is normal in structure. Severe mitral annular calcification. No evidence of mitral valve regurgitation. Moderate mitral valve stenosis. MV peak gradient, 19.0 mmHg. The mean mitral valve gradient is 8.0 mmHg. Tricuspid Valve: The tricuspid valve is normal in structure. Tricuspid valve regurgitation is mild . No evidence of tricuspid stenosis. Aortic Valve: The aortic valve is normal in structure.  There is moderate calcification of the aortic valve. Aortic valve regurgitation is not visualized. No aortic stenosis is present. Pulmonic Valve: The pulmonic valve was normal in structure. Pulmonic valve regurgitation is not visualized. No evidence of pulmonic stenosis. Aorta: The aortic root is normal in size and structure. Venous: The inferior vena cava is normal in size with less than 50% respiratory variability, suggesting right atrial pressure of 8 mmHg. IAS/Shunts: No atrial level shunt detected by color flow Doppler.  LEFT VENTRICLE PLAX 2D LVIDd:         3.70 cm   Diastology LVIDs:         2.70 cm   LV e' medial:    3.15 cm/s LV PW:         1.20 cm   LV E/e' medial:  49.5 LV IVS:        1.40 cm   LV e' lateral:   6.02 cm/s LVOT diam:     1.96 cm   LV E/e' lateral: 25.9 LV SV:         49 LV SV Index:   27 LVOT Area:     3.02 cm  RIGHT VENTRICLE             IVC RV Basal diam:  4.17 cm     IVC diam: 1.98 cm RV S prime:     11.50 cm/s TAPSE (M-mode): 1.6 cm LEFT ATRIUM             Index        RIGHT ATRIUM           Index LA diam:        4.56 cm 2.55 cm/m   RA Area:     13.10 cm LA Vol (A2C):   46.7 ml 26.10 ml/m  RA Volume:   29.50 ml  16.49 ml/m LA Vol (A4C):   54.0 ml 30.18 ml/m LA Biplane Vol: 51.5 ml 28.78 ml/m  AORTIC VALVE LVOT Vmax:   88.00 cm/s LVOT Vmean:  60.800 cm/s LVOT VTI:    0.163 m  AORTA Ao Root diam: 2.99 cm Ao Asc diam:  3.18 cm MITRAL VALVE MV Area (PHT): 5.06 cm     SHUNTS MV Area VTI:   1.33 cm     Systemic VTI:  0.16 m MV Peak grad:  19.0 mmHg    Systemic Diam: 1.96 cm MV Mean grad:  8.0 mmHg MV Vmax:       2.18 m/s MV Vmean:      133.0 cm/s MV Decel Time: 150 msec MV E velocity: 156.00 cm/s MV Kaylen Nghiem velocity: 205.00 cm/s MV E/Jordan Pardini ratio:  0.76 Franck Azobou Tonleu Electronically signed by Joelle Cedars Tonleu Signature Date/Time: 03/28/2024/2:01:56 PM    Final    CT Angio Chest Pulmonary Embolism (PE) W or WO Contrast  Result Date: 03/27/2024 EXAM: CTA of the Chest with contrast for  PE 03/27/2024 05:52:36 PM TECHNIQUE: CTA of the chest was performed after the administration of intravenous contrast. Multiplanar reformatted images are provided for review. MIP images are provided for review. Automated exposure control, iterative reconstruction, and/or weight based adjustment of the mA/kV was utilized to reduce the radiation dose to as low as reasonably achievable. COMPARISON: 08/11/2012 CLINICAL HISTORY: FINDINGS: PULMONARY ARTERIES: Pulmonary arteries are adequately opacified for evaluation. No pulmonary embolism. Main pulmonary artery is normal in caliber. MEDIASTINUM: Coronary artery and aortic atherosclerosis. The heart and pericardium demonstrate no acute abnormality. There is no acute abnormality of the thoracic aorta. LYMPH NODES: No mediastinal, hilar or axillary lymphadenopathy. LUNGS AND PLEURA: Small bilateral pleural effusions with dependent atelectasis in the lower lobes. No focal consolidation or pulmonary edema. No pneumothorax. UPPER ABDOMEN: Limited images of the upper abdomen are unremarkable. SOFT TISSUES AND BONES: Moderate compression fracture at T11, age indeterminate but new since 2014. No acute soft tissue abnormality. IMPRESSION: 1. No pulmonary embolism. 2. Small bilateral pleural effusions with dependent lower lobe atelectasis. 3. Moderate T11 compression fracture, age indeterminate but new since 2014. Electronically signed by: Franky Crease MD 03/27/2024 06:12 PM EST RP Workstation: [id number]   DG Foot 2 Views Left Result Date: 03/25/2024 CLINICAL DATA:  Postop left first digit amputation EXAM: DG FOOT 2V*L* COMPARISON:  03/10/2024 FINDINGS: Frontal and lateral views of the left foot demonstrate interval transmetatarsal amputation of the first digit. Prior third digit amputation again noted. Prior healed fifth metatarsal fracture. No other acute bony abnormalities. Postsurgical changes in the soft tissues of the first digit. IMPRESSION: 1. Interval transmetatarsal  amputation of the first digit, with expected postsurgical changes in the soft tissues. Electronically Signed   By: Ozell Daring M.D.   On: 03/25/2024 21:28   VAS US  ABI WITH/WO TBI Result Date: 03/21/2024  LOWER EXTREMITY DOPPLER STUDY Patient Name:  ELLESSE ANTENUCCI  Date of Exam:   03/21/2024 Medical Rec #: [id number]         Accession #:    [id number] Date of Birth: 08-18-1950         Patient Gender: F Patient Age:   43 years Exam Location:  [address] Procedure:      VAS US  ABI WITH/WO TBI Referring Phys: TERETHA DAMME --------------------------------------------------------------------------------  Indications: Peripheral artery disease.  Vascular Interventions: /7/23:Left CFA to peroneal bypass graft with ipsilateral                         nonreversed translocated saphenous vein                         11/02/21: Left 3d toe amputation                         DCBA left fem-pero bypass.                         06/10/23: Lysis occluded bypass graft.                         DCBA bypass graft.                         Peroneal angioplasty. Comparison Study: 10/26/23 Performing Technologist: Duwaine Hives RVS  Examination Guidelines: Mazella Deen complete evaluation includes at  minimum, Doppler waveform signals and systolic blood pressure reading at the level of bilateral brachial, anterior tibial, and posterior tibial arteries, when vessel segments are accessible. Bilateral testing is considered an integral part of Etan Vasudevan complete examination. Photoelectric Plethysmograph (PPG) waveforms and toe systolic pressure readings are included as required and additional duplex testing as needed. Limited examinations for reoccurring indications may be performed as noted.  ABI Findings: +---------+------------------+-----+----------+--------+ Right    Rt Pressure (mmHg)IndexWaveform  Comment  +---------+------------------+-----+----------+--------+ Brachial 133                                        +---------+------------------+-----+----------+--------+ PTA      102               0.77 monophasic         +---------+------------------+-----+----------+--------+ DP       109               0.82 monophasic         +---------+------------------+-----+----------+--------+ Great Toe14                0.11                    +---------+------------------+-----+----------+--------+ +---------+------------------+-----+-------------------+-------+ Left     Lt Pressure (mmHg)IndexWaveform           Comment +---------+------------------+-----+-------------------+-------+ Brachial 133                                               +---------+------------------+-----+-------------------+-------+ PTA      63                0.47 dampened monophasic        +---------+------------------+-----+-------------------+-------+ DP       73                0.55 dampened monophasic        +---------+------------------+-----+-------------------+-------+ Great Toe0                 0.00 Absent                     +---------+------------------+-----+-------------------+-------+ +-------+-----------+-----------+------------+------------+ ABI/TBIToday's ABIToday's TBIPrevious ABIPrevious TBI +-------+-----------+-----------+------------+------------+ Right  0.82       0.11       0.72        0.46         +-------+-----------+-----------+------------+------------+ Left   0.55       0.0        0.0         0.0          +-------+-----------+-----------+------------+------------+  Summary: Right: Resting right ankle-brachial index indicates mild right lower extremity arterial disease. The right toe-brachial index is abnormal.  Left: Resting left ankle-brachial index indicates moderate left lower extremity arterial disease. The left toe-brachial index is abnormal.  *See table(s) above for measurements and observations.  Electronically signed by Gaile New MD on 03/21/2024 at 10:22:03  AM.    Final    PERIPHERAL VASCULAR CATHETERIZATION Result Date: 03/11/2024 Table formatting from the original result was not included. DATE OF SERVICE: 03/11/2024  PATIENT:  Neville LITTIE Bihari  73 y.o. female  PRE-OPERATIVE DIAGNOSIS:  Atherosclerosis of native arteries of left lower extremity causing ulceration  POST-OPERATIVE DIAGNOSIS:  Same  PROCEDURE:  1) Ultrasound guided right common femoral artery access (CPT [id number]) 2) Aortogram (CPT [phone]) 3) Left lower extremity angiogram with second order cannulation (CPT 36246) 4) Conscious sedation (18 minutes) (CPT 99152)  SURGEON:  Debby SAILOR. Magda, MD  ASSISTANT: none  ANESTHESIA:   local and IV sedation  ESTIMATED BLOOD LOSS: min  LOCAL MEDICATIONS USED:  NONE  COUNTS: confirmed correct.  PATIENT DISPOSITION:  PACU - hemodynamically stable.  Delay start of Pharmacological VTE agent (>24hrs) due to surgical blood loss or risk of bleeding: no  INDICATION FOR PROCEDURE: SHAWNTAE LOWY is Jonessa Triplett 73 y.o. female with left foot ischemic ulceration in setting of known PAD and thrombosed left fem-peroneal bypass. After careful discussion of risks, benefits, and alternatives the patient was offered angiogram. The patient understood and wished to proceed.  OPERATIVE FINDINGS:  Left renal artery: patent Right renal artery: patent Infrarenal aorta: patent Left common iliac artery: patent Right common iliac artery: patent Left internal iliac artery: patent Right internal iliac artery: patent Left external iliac artery: patent Right external iliac artery: patent Left common femoral artery: patent Right common femoral artery: not studied Left profunda femoris artery: patent Right profunda femoris artery: not studied Left superficial femoral artery: patent, but diseased Right superficial femoral artery: not studied Left popliteal artery: occluded Right popliteal artery: not studied Left anterior tibial artery: occluded Right anterior tibial artery: not studied Left tibioperoneal  trunk: patent Right tibioperoneal trunk: not studied Left peroneal artery: patent Right peroneal artery: not studied Left posterior tibial artery: occluded Right posterior tibial artery: not studied Left pedal circulation: fills via peroneal collaterals Right pedal circulation: not studied  GLASS score. FP 4. IP 0. Stage III  WIfI score. 1 / 3 / 1. Stage III.  DESCRIPTION OF PROCEDURE: After identification of the patient in the pre-operative holding area, the patient was transferred to the operating room. The patient was positioned supine on the operating room table. Anesthesia was induced. The groins was prepped and draped in standard fashion. Jozi Malachi surgical pause was performed confirming correct patient, procedure, and operative location.  The right groin was anesthetized with subcutaneous injection of 1% lidocaine . Using ultrasound guidance, the right common femoral artery was accessed with micropuncture technique.  Fluoroscopy was used to confirm cannulation over the femoral head. The 66F micropuncture sheath was upsized to 56F.  Mya Suell Benson wire was advanced into the distal aorta. Over the wire an omni flush catheter was advanced to the level of L2. Aortogram was performed - see above for details.  The left common iliac artery was selected with an omniflush catheter and bentson guidewire. The wire was advanced into the common femoral artery. Over the wire the omni flush catheter was advanced into the external iliac artery. Selective angiography was performed - see above for details.  The sheath was left in place to be removed in the recovery area.  Conscious sedation was administered with the use of IV fentanyl  and midazolam  under continuous physician and nurse monitoring.  Heart rate, blood pressure, and oxygen saturation were continuously monitored.  Total sedation time was 18 minutes  Upon completion of the case instrument and sharps counts were confirmed correct. The patient was transferred to the PACU in good  condition. I was present for all portions of the procedure.  PLAN: ASA / Statin. Needs L femoral - peroneal bypass. This will likely be with PTFE. Can be done as an outpatient.  Debby SAILOR. Magda, MD FACS [facility] Phone Number: [phone] 03/11/2024 8:52 AM   MR FOOT LEFT WO CONTRAST Result Date: 03/11/2024 CLINICAL DATA:  Foot ulcer with erythema and edema EXAM: MRI OF THE LEFT FOOT WITHOUT CONTRAST TECHNIQUE: Multiplanar, multisequence MR imaging of the left foot from the midfoot through the toes was performed. No intravenous contrast was administered. COMPARISON:  Radiographs 03/10/2024 FINDINGS: Despite efforts by the technologist and patient, motion artifact is present on today's exam and could not be eliminated. This reduces exam sensitivity and specificity. Bones/Joint/Cartilage Abnormal high STIR and low T1 signal in the tuft of the distal phalanx great toe suspicious for early osteomyelitis. 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 No additional significant marrow edema in the forefoot. Ligaments Lisfranc ligament intact. Muscles and Tendons Small amount of fluid signal tracks along the flexor digitorum tendons along the midfoot as on image 7 series 9, suggesting extensor digitorum tenosynovitis. Subtle nodularity of the medial band of the plantar fascia distally, cannot exclude mild plantar fibromatosis. Soft tissues Low-grade diffuse subcutaneous edema in the forefoot more confluent below the first MTP joint and in the great toe, cellulitis is Saprina Chuong distinct possibility. IMPRESSION: 1. Abnormal marrow edema in the tuft of the distal phalanx great toe suspicious for early osteomyelitis. 2. Low-grade diffuse subcutaneous edema in the forefoot more confluent below the first MTP joint and in the great toe, cellulitis is Aniella Wandrey distinct possibility. 3. Small amount of fluid signal tracks along the flexor digitorum tendons along the midfoot, suggesting extensor digitorum  tenosynovitis. 4. Subtle nodularity of the medial band of the plantar fascia distally, cannot exclude mild plantar fibromatosis. Electronically Signed   By: Ryan Salvage M.D.   On: 03/11/2024 08:32   DG Foot Complete Left Result Date: 03/10/2024 CLINICAL DATA:  Foot ulcer with erythema and edema in the region of the left great toe. EXAM: LEFT FOOT - COMPLETE 3+ VIEW COMPARISON:  None Available. FINDINGS: Status post middle toe amputation. Posttraumatic deformity noted fifth metatarsal. No bony destruction identified in the medial foot to suggest overt osteomyelitis. IMPRESSION: 1. Status post middle toe amputation. 2. No bony destruction identified in the medial foot to suggest overt osteomyelitis. MRI of the foot with and without contrast would be Laquincy Eastridge more sensitive means to investigate. Electronically Signed   By: Camellia Candle M.D.   On: 03/10/2024 10:19    Microbiology: No results found for this or any previous visit (from the past 240 hours).   Labs: Basic Metabolic Panel: Recent Labs  Lab 03/24/24 0331 03/25/24 0330 03/26/24 0316 03/27/24 0811 03/28/24 0407  NA 134* 133* 132* 130* 135  K 3.6 3.2* 3.5 4.0 3.5  CL 97* 94* 94* 96* 100  CO2 23 24 22  16* 22  GLUCOSE 177* 198* 189* 230* 182*  BUN 8 9 9 9  5*  CREATININE 0.61 0.66 0.62 0.72 0.55  CALCIUM  8.3* 8.9 8.5* 8.2* 8.3*  MG  --   --   --   --  1.6*  PHOS  --   --   --   --  3.1   Liver Function Tests: Recent Labs  Lab 03/21/24 1609 03/23/24 0314 03/27/24 0811 03/28/24 0407  AST 16 17 91* 97*  ALT 19 18 65* 96*  ALKPHOS 56 52 78 61  BILITOT 0.4 0.4 0.9 0.7  PROT 6.2* 6.5 6.5 5.7*  ALBUMIN 2.7* 2.8* 2.4* 2.2*   No results for input(s): LIPASE, AMYLASE in the last 168 hours. No results for input(s): AMMONIA in the last 168 hours. CBC: Recent Labs  Lab 03/24/24 0331 03/25/24 0330 03/26/24 0316 03/27/24 0811 03/28/24 0407  WBC 12.7* 16.9* 17.4* 12.7* 8.0  NEUTROABS  --   --   --  9.1* 5.1  HGB 10.0*  10.5* 10.0* 10.1* 9.0*  HCT 29.9* 32.6* 30.7* 31.7* 27.8*  MCV 91.4 92.6 91.4 92.7 91.7  PLT 409* 391 406* 401* 374   Cardiac Enzymes: No results for input(s): CKTOTAL, CKMB, CKMBINDEX, TROPONINI in the last 168 hours. BNP: BNP (last 3 results) No results for input(s): BNP in the last 8760 hours.  ProBNP (last 3 results) No results for input(s): PROBNP in the last 8760 hours.  CBG: Recent Labs  Lab 03/27/24 2027 03/27/24 2101 03/28/24 0610 03/28/24 0849 03/28/24 1315  GLUCAP 144* 148* 171* 222* 149*       Signed:  Meliton Monte MD.  [facility] 03/28/2024, 2:48 PM

## 2024-03-28 NOTE — Progress Notes (Signed)
  Subjective:  Patient ID: Katie Hawkins, female    DOB: May 19, 1951,  MRN: [id number]  No chief complaint on file.   DOS: 03/25/2024 Procedure: Partial first ray amputation, left foot  73 y.o. female seen for post op check.  Patient denies pain in left foot, discussed follow up plans and to leave the dressing dry and intact. Minimal wb for transfers only to left foot.   Review of Systems: Negative except as noted in the HPI. Denies N/V/F/Ch.   Objective:   Constitutional Well developed. Well nourished.  Vascular Foot warm and well perfused. Capillary refill normal to all digits.   No calf pain with palpation  Neurologic Normal speech. Oriented to person, place, and time. Epicritic sensation intact to left foot  Dermatologic  Amputation site well coapted no drainage mild necrosis proxima aspect with some ecchymosis though overall appears viable.   Orthopedic: Status post partial first ray amputation   Radiographs: 1. Interval transmetatarsal amputation of the first digit, with expected postsurgical changes in the soft tissues.  Pathology: Pending  Micro: Pending  Assessment:   1. Critical lower limb ischemia (HCC)   2. Atherosclerosis of native artery of left lower extremity with gangrene (HCC)   3. 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  POD # 3 s/p partial first ray amputation left foot -Progressing as expected postoperatively.  Amputation site healing without evidence residual infection, mild incisional necrosis, monitor -XR: Expected postoperative changes -WB Status: Minimal weightbearing for transfers and bathroom privileges only in postop shoe  -Sutures: Remain intact 2 to 3 weeks. -Medications/ABX: Recommend 7 days p.o.  doxycycline  and Augmentin on discharge -Dressing: Dressing changed today. remain clean dry and intact until follow-up next week Tuesday  - F/u Plan: Stable for discharge from my standpoint.  Transfer privileges only.  Leave dressing clean dry and intact until follow-up office call to arrange appointment will sign off.        Katie Hawkins, DPM [facility]

## 2024-03-28 NOTE — Progress Notes (Signed)
 Physical Therapy Treatment Patient Details Name: Katie Hawkins MRN: [id number] DOB: 1951-04-30 Today's Date: 03/28/2024   History of Present Illness 73 y.o. 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 underwent L femoral to peroneal bypass with PTFE. 10/31 Partial first ray amputation. 11/1 per podiatry, Minimal weightbearing for transfers and bathroom privileges only in post-op shoe. Pt with post-op waxing/waning delirium and agitation. PMH of PAD, limb ischemia, chronic foot ulcer, obesity, hypertension, hyperlipidemia, diabetes, Hodgkin lymphoma.    PT Comments  Pt received in supine, lethargic after recently napping and agitated, spouse present and reports she is more altered than in AM session with OT and tends to be agitated initially upon awakening, RN notified pt not even fully oriented to self/spouse at this time, A&O x0-1. Pt WB status updated by podiatrist per AM note and PTA discussed with supervising PT Logan B and pt/spouse instructed on changes, DME updated per discussion as pt no longer allowed to ambulate other than TDWB for toileting transfers. Recommend wheelchair DME be used for household and community distance mobility needs, PTA discussed with case mgmt. Very strongly continue to recommend HHPT upon DC as pt more confused and now with weight bearing restrictions that she will need reinforcement/training for as well as new DME to educate on, she will be a high fall risk at home at this time. .Pt continues to benefit from PT services to progress toward functional mobility goals, recommend supervising PT work with her next session to review goals and plan given increased weight bearing restrictions this date.     If plan is discharge home, recommend the following: A little help with walking and/or transfers;A little help with bathing/dressing/bathroom;Supervision due to cognitive status;Assistance with cooking/housework;Direct supervision/assist for  medications management;Direct supervision/assist for financial management;Assist for transportation   Can travel by private Theme Park Manager (measurements PT);Other (comment);BSC/3in1 (spouse likely to refuse 3in1 but will need wheelchair given WB changed to TDWB status per podiatry MD)    Recommendations for Other Services       Precautions / Restrictions Precautions Precautions: Fall Recall of Precautions/Restrictions: Impaired Precaution/Restrictions Comments: waxing/waning agitation/delirium. Restrictions Weight Bearing Restrictions Per Provider Order: Yes LLE Weight Bearing Per Provider Order: Touchdown weight bearing Other Position/Activity Restrictions: for transfers only in post op shoe     Mobility  Bed Mobility Overal bed mobility: Needs Assistance Bed Mobility: Rolling Rolling: Modified independent (Device/Increase time)         General bed mobility comments: pt refusing to attempt EOB/OOB, she rolls impulsively to her R but not allowing PTA to assist to attempt OOB.    Transfers Overall transfer level: Needs assistance                 General transfer comment: pt defers    Ambulation/Gait               General Gait Details: per podiatry, now recommending only LLE minimal WB in post-op shoe, recommend WC for household distance gait tasks   Stairs             Wheelchair Mobility     Tilt Bed    Modified Rankin (Stroke Patients Only)       Balance Overall balance assessment: Needs assistance Sitting-balance support: Feet supported   Sitting balance - Comments: pt defers at time of session, confused/agitated       Standing balance comment: pt too confused/agitated to attempt  Communication Communication Communication: No apparent difficulties  Cognition Arousal: Lethargic Behavior During Therapy: Agitated, Restless   PT - Cognitive impairments:  Difficult to assess, Orientation, Awareness, Memory, Attention, Sequencing, Problem solving, Safety/Judgement Difficult to assess due to: Level of arousal Orientation impairments: Place, Time, Situation                   PT - Cognition Comments: Pt states my husband isn't here although he was in room and responding to her. Pt not oriented to location/situation/time although when reoriented she briefly appeared to believe PTA, but info did not carry over for long. Per spouse, this is about as bad as it gets and pt is more delirious and agitated when she first wakes up, then less so the longer she is awake. RN notified of pt cog status since it is decreased from prior cognition when OT worked with her in AM. Following commands: Impaired Following commands impaired: Follows one step commands inconsistently    Cueing Cueing Techniques: Verbal cues, Gestural cues, Tactile cues  Exercises Other Exercises Other Exercises: pt spouse instructed on supine LE HEP (handout to reinforce) and seated hip flexion/LAQ exercises and frequency. Per chart review, they may defer HHPT but she would benefit from it given increased WB precs    General Comments General comments (skin integrity, edema, etc.): HR ~100-120 bpm in supine;      Pertinent Vitals/Pain Pain Assessment Pain Assessment: PAINAD Breathing: normal Negative Vocalization: occasional moan/groan, low speech, negative/disapproving quality Facial Expression: sad, frightened, frown Body Language: tense, distressed pacing, fidgeting Consolability: no need to console PAINAD Score: 3 Pain Location: pt denies pain in her L foot or leg, but very agitated upon waking Pain Descriptors / Indicators: Restless Pain Intervention(s): Limited activity within patient's tolerance, Monitored during session, Repositioned, Other (comment) (pillow placed under her LLE for comfort/to float her heel)    Home Living                           Prior Function            PT Goals (current goals can now be found in the care plan section) Acute Rehab PT Goals Patient Stated Goal: To go home today. PT Goal Formulation: With patient/family Time For Goal Achievement: 04/07/24 Progress towards PT goals: Not progressing toward goals - comment;PT to reassess next treatment (needs goal update since WB status just changed (MD updated WB in 11/3 note))    Frequency    Min 2X/week      PT Plan      Co-evaluation              AM-PAC PT 6 Clicks Mobility   Outcome Measure  Help needed turning from your back to your side while in a flat bed without using bedrails?: A Little Help needed moving from lying on your back to sitting on the side of a flat bed without using bedrails?: A Little Help needed moving to and from a bed to a chair (including a wheelchair)?: A Little Help needed standing up from a chair using your arms (e.g., wheelchair or bedside chair)?: A Little Help needed to walk in hospital room?: Total (anticipated based on new TDWB precs) Help needed climbing 3-5 steps with a railing? : Total 6 Click Score: 14    End of Session   Activity Tolerance: Patient limited by lethargy;Treatment limited secondary to agitation Patient left: in bed;with call bell/phone within reach;with bed alarm  set;with family/visitor present (spouse at bedside; pt LLE elevated on pillow with heel floated for comfort) Nurse Communication: Mobility status;Other (comment);Precautions;Weight bearing status (pt increased confusion/agitation at time of session) PT Visit Diagnosis: Unsteadiness on feet (R26.81);Other abnormalities of gait and mobility (R26.89);Muscle weakness (generalized) (M62.81);History of falling (Z91.81);Difficulty in walking, not elsewhere classified (R26.2);Other symptoms and signs involving the nervous system (R29.898);Pain Pain - part of body: Leg (pt denies pain but PAIN-AD scale indicates possible increased pain)      Time: 1134 (left ~5 mins to print educational handouts 11:40-11:45 am)-1157 PT Time Calculation (min) (ACUTE ONLY): 23 min  Charges:    $Therapeutic Activity: 8-22 mins PT General Charges $$ ACUTE PT VISIT: 1 Visit                     Amry Cathy P., PTA Acute Rehabilitation Services Secure Chat Preferred 9a-5:30pm Office: [phone]    Connell HERO [facility] 03/28/2024, 2:29 PM

## 2024-03-28 NOTE — TOC Transition Note (Signed)
 Transition of Care (TOC) - Discharge Note Rayfield Gobble RN, BSN Inpatient Care Management Unit 4E- RN Case Manager See Treatment Team for direct phone #   Patient Details  Name: Katie Hawkins MRN: [id number] Date of Birth: [birth year]  [facility] CM/SW Contact:  Gobble Rayfield Hurst, RN Phone Number: 03/28/2024, 3:02 PM   Clinical Narrative:    Pt will likely discharge this afternoon, CM follow up done with pt and spouse at bedside for [facility] and DME needs.  Pt still declines HH for discharge, and voiced that she has DME at home that is needed- including RW and access to wheelchair.  Spouse voiced that he has contacted someone regarding a ramp for home.   No d/c needs identified as pt and spouse have declined any needs.   Spouse will transport home   Final next level of care: Home/Self Care Barriers to Discharge: No Barriers Identified   Patient Goals and CMS Choice Patient states their goals for this hospitalization and ongoing recovery are:: return home   Choice offered to / list presented to : Patient, Spouse      Discharge Placement               Home        Discharge Plan and Services Additional resources added to the After Visit Summary for     Discharge Planning Services: CM Consult Post Acute Care Choice: Durable Medical Equipment, Home Health          DME Arranged: Patient refused services, Walker rolling DME Agency: NA       HH Arranged: PT, OT, Patient Refused HH HH Agency: NA        Social Drivers of Health (SDOH) Interventions SDOH Screenings   Food Insecurity: No Food Insecurity (03/23/2024)  Housing: Low Risk  (03/23/2024)  Transportation Needs: No Transportation Needs (03/23/2024)  Utilities: Not At Risk (03/23/2024)  Social Connections: Socially Integrated (03/23/2024)  Tobacco Use: Low Risk  (03/25/2024)     Readmission Risk Interventions    03/24/2024    4:39 PM  Readmission Risk Prevention Plan   Transportation Screening Complete  HRI or Home Care Consult Complete  Social Work Consult for Recovery Care Planning/Counseling Complete  Palliative Care Screening Not Applicable  Medication Review Oceanographer) Complete

## 2024-03-28 NOTE — Care Plan (Signed)
 Beginning around 2300 on 11/2, pt starting becoming increasingly more confused and disoriented. She was able to state her name and date of birth but she did not know where she was, the date, or the situation. She was repeatedly trying to climb out of bed and had a near fall twice due to her impulsivity, She was pulling at her telemetry leads and also exhibited bizarre behaviors such as trying to drink from her IV site. She was having severe auditory and visual hallucinations, as evidenced by her having full conversations with people not present in the room, and trying to climb out of bed and reach for kittens that she was seeing on the floor of the room. MD notified who ordered haldol and Geodon. Haldol administered initially but was not effective so then Geodon was administered and pt did finally relax and was able to stay in bed although still occasionally trying to reach for her Ivs and leads. The client later in shift had an incident where she had an incontinent episode in bed, and then later towards the end of the shift stated she wanted to walk to bathroom. However at this time her heart rate was in the 150s and she was advised to not get up so as not to increase her HR further. However pt did not listen to directions and she got up to the bathroom and when she got back HR was up to 170s and 180s, and the monitor was saying SVT. HR did eventually start coming back down to the 140s, a stat EKG was performed and it showed Sinus tachycardia. MD notified. Will continue to monitor.

## 2024-03-28 NOTE — Progress Notes (Signed)
 TRH night cross cover note:   I was notified by the patient's RN of this morning's magnesium  level of 1.6.  I subsequently ordered magnesium  sulfate 2 g IV over 2 hours.     Eva Pore, DO Hospitalist

## 2024-03-28 NOTE — Progress Notes (Addendum)
 Progress Note    03/28/2024 6:57 AM 3 Days Post-Op  Subjective:  Katie Hawkins is a 73 year old female s/p femoral to peroneal bypass with PTFE on 03/23/2024 by Dr. Magda and Partial first ray amputation, left foot on 03/25/2024 by Dr. Malvin.   Nursing staff she was confused overnight; confusion was noted this morning. Denies pain in her foot and has no complaints. Her mental status improved as the interaction went on. She wishes to go home.    Vitals:   03/28/24 0110 03/28/24 0315  BP: (!) 94/59 90/69  Pulse: (!) 128 (!) 120  Resp: 18 20  Temp: 97.9 F (36.6 C) 98.5 F (36.9 C)  SpO2: 92% 95%   Physical Exam: Cardiac:  S1S2, RRR, no MRG. Sinus tachycardia of 132 on telemetry in room. Lungs:  CTA, non-labored. Incisions:  c/d/i Extremities:  left groin with prevena with good seal; left lower leg incision clean with steri strips in place. Left foot bandage in place. + left peroneal and PT doppler flow.  Abdomen:  soft, non-tender.   CBC    Component Value Date/Time   WBC 8.0 03/28/2024 0407   RBC 3.03 (L) 03/28/2024 0407   HGB 9.0 (L) 03/28/2024 0407   HGB 13.4 02/20/2014 1159   HCT 27.8 (L) 03/28/2024 0407   HCT 41.0 02/20/2014 1159   PLT 374 03/28/2024 0407   PLT 318 02/20/2014 1159   MCV 91.7 03/28/2024 0407   MCV 89.8 02/20/2014 1159   MCH 29.7 03/28/2024 0407   MCHC 32.4 03/28/2024 0407   RDW 13.0 03/28/2024 0407   RDW 13.6 02/20/2014 1159   LYMPHSABS 1.8 03/28/2024 0407   LYMPHSABS 1.7 02/20/2014 1159   MONOABS 0.8 03/28/2024 0407   MONOABS 0.8 02/20/2014 1159   EOSABS 0.2 03/28/2024 0407   EOSABS 0.1 02/20/2014 1159   BASOSABS 0.0 03/28/2024 0407   BASOSABS 0.1 02/20/2014 1159    BMET    Component Value Date/Time   NA 135 03/28/2024 0407   NA 140 02/20/2014 1159   K 3.5 03/28/2024 0407   K 3.7 02/20/2014 1159   CL 100 03/28/2024 0407   CL 103 08/11/2012 0838   CO2 22 03/28/2024 0407   CO2 27 02/20/2014 1159   GLUCOSE 182 (H) 03/28/2024  0407   GLUCOSE 176 (H) 02/20/2014 1159   GLUCOSE 165 (H) 08/11/2012 0838   BUN 5 (L) 03/28/2024 0407   BUN 16.6 02/20/2014 1159   CREATININE 0.55 03/28/2024 0407   CREATININE 0.7 02/20/2014 1159   CALCIUM  8.3 (L) 03/28/2024 0407   CALCIUM  10.0 02/20/2014 1159   GFRNONAA >60 03/28/2024 0407   GFRAA >60 11/22/2010 1515    INR    Component Value Date/Time   INR 1.0 03/23/2024 0314     Intake/Output Summary (Last 24 hours) at 03/28/2024 0657 Last data filed at 03/27/2024 1849 Gross per 24 hour  Intake 1176.31 ml  Output 0 ml  Net 1176.31 ml     Assessment/Plan:  73 y.o. female is s/p femoral to peroneal bypass with PTFE on 03/23/2024 by Dr. Magda and Partial first ray amputation, left foot on 03/25/2024 by Dr. Malvin.  3 Days Post-Op    - pt with + left peroneal doppler flow.  Prevena with good seal.  Discussed in detail about prevena vac and when it loses seal, it can be removed. Discussed that after prevena loses seal and vac can be removed and the importance of cleaning with soap and water daily and otherwise keeping it dry  to prevent wound infections.  Stressed importance of this given synthetic bypass graft.  She and her husband expressed good understanding.   - pain in left foot improved sinct amputation.  Toe amp dressing to remain clean and dry and intact until f/u in his office per Dr. Malvin.  He reports good bleeding intra-op.  Minimal weight bearing and only in post op shoe - confusion overnight - received Haldol and Geodon, likely culprit of transaminitis.  - Mg - 1.6 this AM. Hospitalist ordered 2g over 2 hours.  - DVT prophylaxis:  Xarelto - ok for discharge from vascular standpoint.  Pt has f/u scheduled with [facility]   Lytle Pizza, PA-S  [facility] [phone] 03/28/2024 6:57 AM   VASCULAR STAFF ADDENDUM: I have independently interviewed and examined the patient. I agree with the above.  Looks ready for discharge Incisions well  appearing Foot warm and well perfused Plan follow up with me in 4 weeks with ABI and duplex. Continue ASA, Xarelto, statin  Debby SAILOR. Magda, MD [facility] Phone Number: [phone] 03/28/2024 12:03 PM

## 2024-03-28 NOTE — Progress Notes (Signed)
 PROGRESS NOTE    Katie Hawkins  FMW:[id number] DOB: [birth year] DOA: 03/21/2024 PCP: Claudene Pellet, MD  No chief complaint on file.   Brief Narrative:    73 yr old woman who was admitted to [facility] by vascular surgery due to concern for critical limb ischemia on 03/21/2024. She went for Shamaya Kauer follow up visit with vascular surgery on 03/20/2024 after  being inpatient 10/16/-10/17 for ischemia and early osteomyelitis on MRI.  The ulcer had worsened. The plan is for the patient to undergo angiography on 03/23/2024.    On 03/23/2024 after the procedure the patient is seen in PACU. She underwent Kyrollos Cordell left common femoral to peroneal artery bypass with Tempestt Silba subfascial tunneling of 6 mm e PTFE She has tolerated the procedure well. Per vascular surgery the patient will be started on Corrissa Martello DOAC once her hemoglobin is stable. She will continue ASA and statin therapies. Will mobilize as possible. He will see the patient in 4 weeks with ABI and left lower extremity arterial duplex. Although vascular surgery is just watching the left foot at this point, there is Tyaira Heward high likelihood for amputation of the great toe in the future.   This afternoon the patient has been noted to be unsteady on her feet. He husband is unsure about her judgement and her ability to remain safe on her feet at home.   On 03/25/2024 the patient's WBC count increased to over 23k. The foot was re-evaluated by podiatry and vascular surgery. The plan changed to amputation of the partial first ray of the left foot. Assessment & Plan:   Principal Problem:   Critical lower limb ischemia (HCC) Active Problems:   Type 2 diabetes mellitus with complication, without long-term current use of insulin  (HCC)   Hyperlipidemia associated with type 2 diabetes mellitus (HCC)   Hypertension associated with diabetes (HCC)   Obesity   Personal history of Hodgkin lymphoma   PAD (peripheral artery disease)   Ulcer of left foot (HCC)   Osteomyelitis of great toe of  left foot (HCC)  Sinus Tachycardia Unexplained sinus tachycardia - seemed to improve temporarily with IVF bolus yesterday, but back to 120-130 today while sitting up in chair EKG this AM, with sinus tach again TSH wnl  Pain seems adequately controlled.  No c/o LH with standing.  No CP.  She's asymptomatic.   CT PE protocol without PE Will get echo - if no concerning findings on echo, has had rather extensive workup and will plan to d/c her home with outpatient follow up Will increase PO metop to 25 mg BID and follow HR (given sinus tach, not trying to completely normalize) EKG with sinus tach. S1Q3T3 pattern, T wave inversions in V3-V6 resolved today.  T wave flattening in II, inversions in III and aVF.    Critical Limb Ischemia PAD Osteomyelitis  Now s/p L common femoral to peroneal artery bypass 10/29 with vascular S/p partial first ray amputation, L foot 10/31 Vascular recommending 4 week follow up with ABI and LLE arterial duplex - continue aspirin  and doac therapy, continue statin.  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 Will minimize pain meds, reduce oxy dose Follow b12 (elevated), TSH (wnl), folate (wnl) Required haldol/geodon the past couple of nights, but she's less confused today (and yesterday during the day) - I think she'll do much better at home.  Delirium precautions  Hypertension Metoprolol , lisinopril    Dyslipidemia Lipitor, zetia    T2DM SSI  GERD ppi    DVT prophylaxis: xarelto Code Status: full Family  Communication: husband at bedside Disposition:   Status is: Inpatient Remains inpatient appropriate because: inpatient   Consultants:  Vascular podiatry  Procedures:   10/29  PROCEDURE:   Left common femoral to peroneal artery bypass with 6mm ePTFE in subfascial tunnel (CPT 64333)  10/31 Procedure:  1. Partial first ray amputation, left foot  Antimicrobials:  Anti-infectives (From admission, onward)    Start     Dose/Rate Route Frequency Ordered Stop   03/27/24 0000  amoxicillin-clavulanate (AUGMENTIN) 875-125 MG tablet        1 tablet Oral Every 12 hours 03/27/24 1327 04/03/24 2359   03/26/24 2200  amoxicillin-clavulanate (AUGMENTIN) 875-125 MG per tablet 1 tablet        1 tablet Oral Every 12 hours 03/26/24 1847     03/26/24 1630  vancomycin (VANCOREADY) IVPB 1250 mg/250 mL  Status:  Discontinued        1,250 mg 166.7 mL/hr over 90 Minutes Intravenous Every 24 hours 03/26/24 1018 03/26/24 1847   03/24/24 2200  doxycycline  (VIBRA -TABS) tablet 100 mg        100 mg Oral Every 12 hours 03/24/24 1508 03/31/24 2159   03/24/24 0000  doxycycline  (VIBRA -TABS) 100 MG tablet        100 mg Oral Every 12 hours 03/24/24 1526     03/23/24 1600  ceFAZolin  (ANCEF ) IVPB 2g/100 mL premix        2 g 200 mL/hr over 30 Minutes Intravenous Every 8 hours 03/23/24 1156 03/24/24 0023   03/23/24 0600  ceFAZolin  (ANCEF ) IVPB 2g/100 mL premix        2 g 200 mL/hr over 30 Minutes Intravenous 30 min pre-op  03/23/24 0238 03/23/24 0825   03/21/24 2200  piperacillin-tazobactam (ZOSYN) IVPB 3.375 g  Status:  Discontinued       Placed in Followed by Linked Group   3.375 g 12.5 mL/hr over 240 Minutes Intravenous Every 8 hours 03/21/24 1533 03/26/24 1847   03/21/24 1630  vancomycin (VANCOCIN) IVPB 1000 mg/200 mL premix  Status:  Discontinued        1,000 mg 200 mL/hr over 60 Minutes Intravenous Every 24 hours 03/21/24 1533 03/26/24 1018   03/21/24 1630  piperacillin-tazobactam (ZOSYN) IVPB 3.375 g        Placed in Followed by Linked Group   3.375 g 100 mL/hr over 30 Minutes Intravenous  Once 03/21/24 1533 03/21/24 1656       Subjective: No complaints Husband at bedside  Objective: Vitals:   03/27/24 2020 03/28/24 0110 03/28/24 0315 03/28/24 0922  BP: 95/64 (!) 94/59 90/69 (!) 117/52  Pulse: (!) 105 (!) 128 (!) 120 (!) 121  Resp: 11 18 20    Temp: 98 F (36.7 C) 97.9 F (36.6 C) 98.5 F (36.9 C)   TempSrc: Oral Oral Oral   SpO2: 96% 92% 95%   Weight:        Intake/Output Summary (Last 24 hours) at 03/28/2024 0942 Last data filed at 03/28/2024 0650 Gross per 24 hour  Intake 1176.31 ml  Output --  Net 1176.31 ml   Fredricka  Weights   03/24/24 0505  Weight: 75.6 kg    Examination:  General: No acute distress. Sitting up in chair in no distress. Cardiovascular: tachycardic, regular - sinus on tele/EKG Lungs: unlabored Neurological: Alert and oriented 3. Moves all extremities 4 with equal strength. Cranial nerves II through XII grossly intact. Extremities: intact dressing to LLE     Data Reviewed: I have personally reviewed following labs and imaging studies  CBC: Recent Labs  Lab 03/24/24 0331 03/25/24 0330 03/26/24 0316 03/27/24 0811 03/28/24 0407  WBC 12.7* 16.9* 17.4* 12.7* 8.0  NEUTROABS  --   --   --  9.1* 5.1  HGB 10.0* 10.5* 10.0* 10.1* 9.0*  HCT 29.9* 32.6* 30.7* 31.7* 27.8*  MCV 91.4 92.6 91.4 92.7 91.7  PLT 409* 391 406* 401* 374    Basic Metabolic Panel: Recent Labs  Lab 03/24/24 0331 03/25/24 0330 03/26/24 0316 03/27/24 0811 03/28/24 0407  NA 134* 133* 132* 130* 135  K 3.6 3.2* 3.5 4.0 3.5  CL 97* 94* 94* 96* 100  CO2 23 24 22  16* 22  GLUCOSE 177* 198* 189* 230* 182*  BUN 8 9 9 9  5*  CREATININE 0.61 0.66 0.62 0.72 0.55  CALCIUM  8.3* 8.9 8.5* 8.2* 8.3*  MG  --   --   --   --  1.6*  PHOS  --   --   --   --  3.1    GFR: Estimated Creatinine Clearance: 60.1 mL/min (by C-G formula based on SCr of 0.55 mg/dL).  Liver Function  Tests: Recent Labs  Lab 03/21/24 1609 03/23/24 0314 03/27/24 0811 03/28/24 0407  AST 16 17 91* 97*  ALT 19 18 65* 96*  ALKPHOS 56 52 78 61  BILITOT 0.4 0.4 0.9 0.7  PROT 6.2* 6.5 6.5 5.7*  ALBUMIN 2.7* 2.8* 2.4* 2.2*    CBG: Recent Labs  Lab 03/27/24 1543 03/27/24 2027 03/27/24 2101 03/28/24 0610 03/28/24 0849  GLUCAP 233* 144* 148* 171* 222*     No results found for this or any previous visit (from the past 240 hours).       Radiology Studies: CT Angio Chest Pulmonary Embolism (PE) W or WO Contrast Result Date: 03/27/2024 EXAM: CTA of the Chest with contrast for PE 03/27/2024 05:52:36 PM TECHNIQUE: CTA of the chest was performed after the administration of intravenous contrast. Multiplanar reformatted images are provided for review. MIP images are provided for review. Automated exposure control, iterative reconstruction, and/or weight based adjustment of the mA/kV was utilized to reduce the radiation dose to as low as reasonably achievable. COMPARISON: 08/11/2012 CLINICAL HISTORY: FINDINGS: PULMONARY ARTERIES: Pulmonary arteries are adequately opacified for evaluation. No pulmonary embolism. Main pulmonary artery is normal in caliber. MEDIASTINUM: Coronary artery and aortic atherosclerosis. The heart and pericardium demonstrate no acute abnormality. There is no acute abnormality of the thoracic aorta. LYMPH NODES: No mediastinal, hilar or axillary lymphadenopathy. LUNGS AND PLEURA: Small bilateral pleural effusions with dependent atelectasis in the lower lobes. No focal consolidation or pulmonary edema. No pneumothorax. UPPER ABDOMEN: Limited images of the upper abdomen are unremarkable. SOFT TISSUES AND BONES: Moderate compression fracture at T11, age indeterminate but new since 2014. No acute soft tissue abnormality. IMPRESSION: 1. No pulmonary embolism. 2. Small bilateral pleural effusions with dependent lower lobe atelectasis. 3. Moderate T11 compression fracture, age  indeterminate but new since 2014. Electronically signed by: Franky Crease MD 03/27/2024 06:12 PM EST RP Workstation: [id number]        Scheduled Meds:  acetaminophen   1,000 mg Oral Q8H   amoxicillin-clavulanate  1 tablet Oral Q12H   aspirin  EC  81 mg Oral Daily   atorvastatin  40 mg Oral QHS   clopidogrel   75 mg Oral Daily   doxycycline   100 mg Oral Q12H   ezetimibe   10 mg Oral Daily   insulin  aspart  0-15 Units Subcutaneous TID WC   insulin  aspart  0-5 Units Subcutaneous QHS   insulin  glargine-yfgn  15 Units Subcutaneous Daily   lisinopril   10 mg Oral Daily   metoprolol  tartrate  25 mg Oral BID   pantoprazole   40 mg Oral Daily   rivaroxaban  2.5 mg Oral BID   sodium chloride  flush  3 mL Intravenous Q12H   sodium chloride  flush  3 mL Intravenous Q12H   traZODone  50 mg Oral QHS   Continuous Infusions:  sodium chloride        LOS: 7 days    Time spent: over 30 min     Meliton Monte, MD [facility]   To contact the attending provider between 7A-7P or the covering provider during after hours 7P-7A, please log into the web site [email or web address] and access using universal [place] password for that web site. If you do not have the password, please call the hospital operator.  03/28/2024, 9:42 AM

## 2024-03-28 NOTE — Progress Notes (Signed)
  Echocardiogram 2D Echocardiogram has been performed.  Koleen KANDICE Popper, RDCS 03/28/2024, 10:56 AM

## 2024-03-29 ENCOUNTER — Other Ambulatory Visit (HOSPITAL_COMMUNITY): Payer: Self-pay

## 2024-03-29 NOTE — Progress Notes (Signed)
 Late note: Received call from [facility] doctor office concerning pt not getting aal of her TOC medications. Multiple calls to [facility] pharmacy, inpt pharmacy, discharge RN Jackquelyn, RN Laneta who returned TOC medications to safe in in-patient pharmacy. Retrieved medications from vault and pt/husband picked up from main entrance. Confusion due to pt picking up only one medication from [facility], husband was asked to get home medications from pharmacy. Husband stated that there were no home medications sent to safe. Much confusion. Ann Nena Hoard, RN

## 2024-03-30 LAB — SURGICAL PATHOLOGY

## 2024-03-31 LAB — AEROBIC/ANAEROBIC CULTURE W GRAM STAIN (SURGICAL/DEEP WOUND): Gram Stain: NONE SEEN

## 2024-04-05 ENCOUNTER — Ambulatory Visit (INDEPENDENT_AMBULATORY_CARE_PROVIDER_SITE_OTHER): Admitting: Podiatry

## 2024-04-05 DIAGNOSIS — M869 Osteomyelitis, unspecified: Secondary | ICD-10-CM

## 2024-04-05 DIAGNOSIS — Z9889 Other specified postprocedural states: Secondary | ICD-10-CM

## 2024-04-05 NOTE — Progress Notes (Signed)
  Subjective:  Patient ID: Katie Hawkins, female    DOB: 16-Dec-1950,  MRN: [id number]  Chief Complaint  Patient presents with   Wound Check    Partial first ray amputation left foot.0 pain. NIDDM A1C 8.6. Wearing surgical shoe.    DOS: 03/25/2024 Procedure: Partial first ray amputation, left foot  73 y.o. female seen for post op check.   Patient has been doing dressing change with Ace wrap twice since being discharged from hospital.  Denies much drainage.  Denies pain wearing postop shoe walking with that.  Review of Systems: Negative except as noted in the HPI. Denies N/V/F/Ch.   Objective:   Constitutional Well developed. Well nourished.  Vascular Foot warm and well perfused. Capillary refill normal to all digits.   No calf pain with palpation  Neurologic Normal speech. Oriented to person, place, and time. Epicritic sensation intact to left foot  Dermatologic  Amputation site overall well coapted no dehiscence there is some necrosis especially along the proximal aspect of the amputation line.  Mild superficial ulceration distally with some maceration no malodor.    Orthopedic: Status post partial first ray amputation   Radiographs: 1. Interval transmetatarsal amputation of the first digit, with expected postsurgical changes in the soft tissues.  Pathology: A. TOE, LEFT GREAT, AMPUTATION:  - Toe with soft tissue necrosis and underlying acute osteomyelitis.  - Proximal margin with cut surface of bone; negative for acute  osteomyelitis.   Micro:    ABUNDANT KLEBSIELLA OXYTOCA MODERATE PSEUDOMONAS AERUGINOSA ABUNDANT ENTEROCOCCUS FAECALIS ABUNDANT BACTEROIDES UNIFORMIS BETA LACTAMASE POSITIVE    Assessment:   1. Osteomyelitis of great toe of left foot (HCC)   2. Post-operative state    Osteomyelitis left great toe and abscess plantar first MPJ status post partial first ray amputation left foot  Plan:  Patient was evaluated and treated and all questions  answered.  1.5 weeks  s/p partial first ray amputation left foot -Progressing as expected postoperatively.  Amputation site healing without evidence residual infection, mild incisional necrosis, continue to monitor.  Fortunately no dehiscence -XR: Expected postoperative changes -WB Status: Minimal weightbearing for short distance only in postop shoe -Sutures: Remain intact 2 to 3 weeks. -Medications/ABX: Status post course of doxycycline  Augmentin on discharge monitor off antibiotics and finish -Dressing: Placed new Betadine 4 x 4 gauze Kerlix Ace wrap dressing okay to change this with the same every 2 to 3 days - F/u Plan: Follow-up in 1 week for recheck        Katie Hawkins, DPM [facility] / [facility]

## 2024-04-11 DIAGNOSIS — E1165 Type 2 diabetes mellitus with hyperglycemia: Secondary | ICD-10-CM | POA: Diagnosis not present

## 2024-04-11 DIAGNOSIS — I10 Essential (primary) hypertension: Secondary | ICD-10-CM | POA: Diagnosis not present

## 2024-04-11 DIAGNOSIS — I739 Peripheral vascular disease, unspecified: Secondary | ICD-10-CM | POA: Diagnosis not present

## 2024-04-11 DIAGNOSIS — S98112A Complete traumatic amputation of left great toe, initial encounter: Secondary | ICD-10-CM | POA: Diagnosis not present

## 2024-04-11 DIAGNOSIS — Z8739 Personal history of other diseases of the musculoskeletal system and connective tissue: Secondary | ICD-10-CM | POA: Diagnosis not present

## 2024-04-11 DIAGNOSIS — R000 Tachycardia, unspecified: Secondary | ICD-10-CM | POA: Diagnosis not present

## 2024-04-11 DIAGNOSIS — Z23 Encounter for immunization: Secondary | ICD-10-CM | POA: Diagnosis not present

## 2024-04-11 DIAGNOSIS — Z09 Encounter for follow-up examination after completed treatment for conditions other than malignant neoplasm: Secondary | ICD-10-CM | POA: Diagnosis not present

## 2024-04-12 ENCOUNTER — Ambulatory Visit: Admitting: Podiatry

## 2024-04-12 ENCOUNTER — Encounter: Payer: Self-pay | Admitting: Podiatry

## 2024-04-12 DIAGNOSIS — Z9889 Other specified postprocedural states: Secondary | ICD-10-CM

## 2024-04-12 DIAGNOSIS — M869 Osteomyelitis, unspecified: Secondary | ICD-10-CM

## 2024-04-12 MED ORDER — MUPIROCIN 2 % EX OINT: 1.0000 | TOPICAL_OINTMENT | Freq: Every day | CUTANEOUS | 0 refills | Status: AC

## 2024-04-12 NOTE — Progress Notes (Signed)
  Subjective:  Patient ID: Katie Hawkins, female    DOB: Oct 03, 1950,  MRN: [id number]  Chief Complaint  Patient presents with   Routine Post Op    F/U amputation of left second toe at MPJ level. Slight swelling and drainage. 0 pain.A1C 8.6. Wearing surgical shoe.    DOS: 03/25/2024 Procedure: Partial first ray amputation, left foot  73 y.o. female seen for post op check.   She is now approximately 2 weeks out from partial first ray amputation left foot.  Wearing surgical shoe.  Has been doing 3 times weekly dressing changes with Betadine and gauze.  Review of Systems: Negative except as noted in the HPI. Denies N/V/F/Ch.   Objective:   Constitutional Well developed. Well nourished.  Vascular Foot warm and well perfused. Capillary refill normal to all digits.   No calf pain with palpation  Neurologic Normal speech. Oriented to person, place, and time. Epicritic sensation intact to left foot  Dermatologic  Amputation site slightly improved from prior there is some necrosis proximally though well coapted there.  Distally there is superficial dehiscence of the subcutaneous fat tissue layer.  Does appear to be healing and slowly by secondary intent no drainage or erythema surrounding no evidence of infection.   Orthopedic: Status post partial first ray amputation   Radiographs: 1. Interval transmetatarsal amputation of the first digit, with expected postsurgical changes in the soft tissues.  Pathology: A. TOE, LEFT GREAT, AMPUTATION:  - Toe with soft tissue necrosis and underlying acute osteomyelitis.  - Proximal margin with cut surface of bone; negative for acute  osteomyelitis.   Micro:    ABUNDANT KLEBSIELLA OXYTOCA MODERATE PSEUDOMONAS AERUGINOSA ABUNDANT ENTEROCOCCUS FAECALIS ABUNDANT BACTEROIDES UNIFORMIS BETA LACTAMASE POSITIVE    Assessment:   1. Osteomyelitis of great toe of left foot (HCC)   2. Post-operative state     Osteomyelitis left great toe and  abscess plantar first MPJ status post partial first ray amputation left foot  Plan:  Patient was evaluated and treated and all questions answered.  2.5 weeks  s/p partial first ray amputation left foot -Progressing with some superficial dehiscence distally to the subcutaneous fat tissue level.  Will proceed with local wound care for this area.  Lightly debrided the amputation site at this visit with tissue nipper to healthy fat tissue level no evidence of infection. -Recommend Monday Wednesday Friday wash wound with wound wash spray then dry and apply antibiotic ointment and dry gauze dressing change to the amputation site wound. -XR: Expected postoperative changes -WB Status: Minimal weightbearing for short distance only in postop shoe -Sutures: Removed in total at this visit -Medications/ABX: Status post course of doxycycline  Augmentin on discharge monitor off antibiotics  -Dressing: Mupirocin  and dry gauze dressing Monday Wednesday Friday as above - F/u Plan: Follow-up in 2 weeks for recheck        [name], DPM [facility] / [facility]

## 2024-04-18 NOTE — Progress Notes (Unsigned)
 HISTORY AND PHYSICAL     CC:  follow up. Requesting Provider:  Claudene Pellet, MD  HPI: This is a 73 y.o. female who is here today for follow up for PAD.  Pt has hx of ***  Vascular hx: -left CFA to peroneal artery bypass with GSV 10/30/2021 Dr. Serene -drug coated balloon angioplasty left leg bypass 09/30/2022 Dr. Serene -mechanical thrombectomy, laser atherectomy left leg bypass, thrombolytic catheter and TPA 06/09/2023 Dr. Serene -drug coated balloon angioplasty left leg byass with peroneal artery angioplasty 06/10/2023 Dr. Lanis.  -left CFA to peroneal artery bypass with 6mm PTFE in subfascial tunner 03/23/2024 Dr. Magda.  -partial 1st ray amputation 03/25/2024 Dr. Malvin.  Pt was seen by Dr. Malvin on 04/12/2024 and at that time, she had some superficial dehiscence distally to the subcu level and wound care was initiated after he lightly debrided the site.    The pt returns today for follow up.  ***  The pt is on asa/statin/Xarelto   Past Medical History:  Diagnosis Date   Diabetes mellitus    History of blood transfusion    HTN (hypertension), benign 02/13/2012   Hypercholesterolemia    Hyperlipidemia    Hypertension    nodular lymphocyte predominate hodgkins lymphoma dx'd 10/2010   xrt comp 02/20/11   Peripheral vascular disease     Past Surgical History:  Procedure Laterality Date   ABDOMINAL AORTOGRAM W/LOWER EXTREMITY N/A 10/18/2021   Procedure: ABDOMINAL AORTOGRAM W/LOWER EXTREMITY;  Surgeon: Magda Debby SAILOR, MD;  [facility]: [facility];  Service: Cardiovascular;  Laterality: N/A;   ABDOMINAL AORTOGRAM W/LOWER EXTREMITY N/A 09/30/2022   Procedure: ABDOMINAL AORTOGRAM W/LOWER EXTREMITY;  Surgeon: Serene Gaile ORN, MD;  Location: [facility];  Service: Cardiovascular;  Laterality: N/A;   ABDOMINAL AORTOGRAM W/LOWER EXTREMITY N/A 06/09/2023   Procedure: ABDOMINAL AORTOGRAM W/LOWER EXTREMITY;  Surgeon: Serene Gaile ORN, MD;  [facility];  Service: Cardiovascular;  Laterality: N/A;   ABDOMINAL AORTOGRAM W/LOWER EXTREMITY N/A 03/11/2024   Procedure: ABDOMINAL AORTOGRAM W/LOWER EXTREMITY;  Surgeon: Magda Debby SAILOR, MD;  [facility]: [facility];  Service: Cardiovascular;  Laterality: N/A;   AMPUTATION Left 03/25/2024   Procedure: Partial ray amputation, left great toe;  Surgeon: Malvin Marsa FALCON, DPM;  Location: [facility];  Service: Orthopedics/Podiatry;  Laterality: Left;  Left great toe amputation   AMPUTATION TOE Left 11/01/2021   Procedure: AMPUTATION TOE;  Surgeon: Silva Juliene SAUNDERS, DPM;  Location: [facility];  Service: Podiatry;  Laterality: Left;  will do own block   APPLICATION OF WOUND VAC Left 10/30/2021   Procedure: APPLICATION OF WOUND VAC;  Surgeon: Serene Gaile ORN, MD;  Location: [facility];  Service: Vascular;  Laterality: Left;   BYPASS GRAFT FEMORAL-PERONEAL Left 03/23/2024   Procedure: CREATION, BYPASS, ARTERIAL, COMMON FEMORAL TO PERONEAL, USING GORTEX GRAFT;  Surgeon: Magda Debby SAILOR, MD;  Location: [facility];  Service: Vascular;  Laterality: Left;   BYPASS GRAFT POPLITEAL TO TIBIAL Left 10/30/2021   Procedure: LEFT FEMORAL TO PERONEAL BYPASS GRAFT USING VEIN;  Surgeon: Serene Gaile ORN, MD;  [facility];  Service: Vascular;  Laterality: Left;   CATARACT EXTRACTION Bilateral    CESAREAN SECTION     2   COLONOSCOPY     DILATION AND CURETTAGE OF UTERUS     LOWER EXTREMITY ANGIOGRAPHY N/A 03/11/2024   Procedure: Lower Extremity Angiography;  Surgeon: Magda Debby SAILOR, MD;  [facility];  Service: Cardiovascular;  Laterality: N/A;  PERIPHERAL VASCULAR ATHERECTOMY  06/09/2023   Procedure: PERIPHERAL VASCULAR ATHERECTOMY;  Surgeon: Serene Gaile ORN, MD;  [facility];  Service: Cardiovascular;;   PERIPHERAL VASCULAR BALLOON ANGIOPLASTY  09/30/2022   Procedure: PERIPHERAL VASCULAR BALLOON ANGIOPLASTY;  Surgeon: Serene Gaile ORN, MD;  [facility];  Service:  Cardiovascular;;   PERIPHERAL VASCULAR BALLOON ANGIOPLASTY Left 06/10/2023   Procedure: PERIPHERAL VASCULAR BALLOON ANGIOPLASTY;  Surgeon: Lanis Fonda BRAVO, MD;  Location: [facility] CV [facility];  Service: Cardiovascular;  Laterality: Left;   PERIPHERAL VASCULAR THROMBECTOMY  06/09/2023   Procedure: PERIPHERAL VASCULAR THROMBECTOMY;  Surgeon: Serene Gaile ORN, MD;  [facility];  Service: Cardiovascular;;   PERIPHERAL VASCULAR THROMBECTOMY Left 06/10/2023   Procedure: LYSIS RECHECK;  Surgeon: Lanis Fonda BRAVO, MD;  [facility] CV [facility];  Service: Cardiovascular;  Laterality: Left;   VEIN HARVEST Left 10/30/2021   Procedure: VEIN HARVEST;  Surgeon: Serene Gaile ORN, MD;  Location: [facility] OR;  Service: Vascular;  Laterality: Left;    No Active Allergies   PHYSICAL EXAMINATION:  ***  General:  WDWN in NAD; vital signs documented above Gait: Not observed Incisions: *** Vascular Exam/Pulses: *** Extremities: {With/Without:20273} ischemic changes, {With/Without:20273} Gangrene , {With/Without:20273} cellulitis; {With/Without:20273} open wounds     ASSESSMENT/PLAN:: 73 y.o. female here for follow up for PAD with hx of: -left CFA to peroneal artery bypass with 6mm PTFE in subfascial tunner 03/23/2024 Dr. Magda.  -partial 1st ray amputation 03/25/2024 Dr. Malvin.   -*** -continue asa/statin and Xarelto  -left foot amputation care per Dr. Malvin -discussed importance of increased walking daily once cleared from Dr. Malvin as for now, she can walk short distance with minimal weight bearing in post op shoe.  -pt will f/u with Dr. Magda on 05/03/2024 with bypass duplex and ABI   Lucie Apt, [facility] [phone]  Clinic MD:   Gretta

## 2024-04-19 ENCOUNTER — Ambulatory Visit: Attending: Internal Medicine | Admitting: Physician Assistant

## 2024-04-19 ENCOUNTER — Other Ambulatory Visit (HOSPITAL_COMMUNITY): Payer: Self-pay

## 2024-04-19 VITALS — BP 150/85 | HR 93 | Temp 98.0°F | Ht 63.0 in | Wt 161.0 lb

## 2024-04-19 DIAGNOSIS — I70262 Atherosclerosis of native arteries of extremities with gangrene, left leg: Secondary | ICD-10-CM

## 2024-04-19 MED ORDER — FLUCONAZOLE 100 MG PO TABS
100.0000 mg | ORAL_TABLET | Freq: Every day | ORAL | 0 refills | Status: DC
  Filled 2024-04-19: qty 5, 5d supply, fill #0

## 2024-04-27 ENCOUNTER — Ambulatory Visit

## 2024-04-27 ENCOUNTER — Ambulatory Visit: Admitting: Podiatry

## 2024-04-27 ENCOUNTER — Encounter: Payer: Self-pay | Admitting: Podiatry

## 2024-04-27 ENCOUNTER — Other Ambulatory Visit: Payer: Self-pay | Admitting: Podiatry

## 2024-04-27 DIAGNOSIS — L97422 Non-pressure chronic ulcer of left heel and midfoot with fat layer exposed: Secondary | ICD-10-CM | POA: Diagnosis not present

## 2024-04-27 DIAGNOSIS — E08621 Diabetes mellitus due to underlying condition with foot ulcer: Secondary | ICD-10-CM

## 2024-04-27 DIAGNOSIS — M869 Osteomyelitis, unspecified: Secondary | ICD-10-CM

## 2024-04-27 MED ORDER — AMOXICILLIN-POT CLAVULANATE 875-125 MG PO TABS: 1.0000 | ORAL_TABLET | Freq: Two times a day (BID) | ORAL | 0 refills | Status: DC

## 2024-04-27 NOTE — Progress Notes (Signed)
 Chief Complaint  Patient presents with   Routine Post Op    Pt is here to f/u on left foot, after amputation, states she has no pain, concern with draining from the site and white ring around the incision. DOS 03/25/24    Subjective:  Patient presents today status post partial first ray amputation of the left foot.  DOS: 03/25/2024.  Inpatient with Dr. Malvin.  They have been applying mupirocin  ointment to the wound.  Currently WBAT in a surgical shoe  Past Medical History:  Diagnosis Date   Diabetes mellitus    History of blood transfusion    HTN (hypertension), benign 02/13/2012   Hypercholesterolemia    Hyperlipidemia    Hypertension    nodular lymphocyte predominate hodgkins lymphoma dx'd 10/2010   xrt comp 02/20/11   Peripheral vascular disease     Past Surgical History:  Procedure Laterality Date   ABDOMINAL AORTOGRAM W/LOWER EXTREMITY N/A 10/18/2021   Procedure: ABDOMINAL AORTOGRAM W/LOWER EXTREMITY;  Surgeon: Magda Debby SAILOR, MD;  [facility];  Service: Cardiovascular;  Laterality: N/A;   ABDOMINAL AORTOGRAM W/LOWER EXTREMITY N/A 09/30/2022   Procedure: ABDOMINAL AORTOGRAM W/LOWER EXTREMITY;  Surgeon: Serene Gaile ORN, MD;  [facility];  Service: Cardiovascular;  Laterality: N/A;   ABDOMINAL AORTOGRAM W/LOWER EXTREMITY N/A 06/09/2023   Procedure: ABDOMINAL AORTOGRAM W/LOWER EXTREMITY;  Surgeon: Serene Gaile ORN, MD;  [facility];  Service: Cardiovascular;  Laterality: N/A;   ABDOMINAL AORTOGRAM W/LOWER EXTREMITY N/A 03/11/2024   Procedure: ABDOMINAL AORTOGRAM W/LOWER EXTREMITY;  Surgeon: Magda Debby SAILOR, MD;  [facility];  Service: Cardiovascular;  Laterality: N/A;   AMPUTATION Left 03/25/2024   Procedure: Partial ray amputation, left great toe;  Surgeon: Malvin Marsa FALCON, DPM;  [facility];  Service: Orthopedics/Podiatry;  Laterality: Left;  Left great toe amputation   AMPUTATION TOE Left  11/01/2021   Procedure: AMPUTATION TOE;  Surgeon: Silva Juliene SAUNDERS, DPM;  [facility];  Service: Podiatry;  Laterality: Left;  will do own block   APPLICATION OF WOUND VAC Left 10/30/2021   Procedure: APPLICATION OF WOUND VAC;  Surgeon: Serene Gaile ORN, MD;  [facility];  Service: Vascular;  Laterality: Left;   BYPASS GRAFT FEMORAL-PERONEAL Left 03/23/2024   Procedure: CREATION, BYPASS, ARTERIAL, COMMON FEMORAL TO PERONEAL, USING GORTEX GRAFT;  Surgeon: Magda Debby SAILOR, MD;  [facility];  Service: Vascular;  Laterality: Left;   BYPASS GRAFT POPLITEAL TO TIBIAL Left 10/30/2021   Procedure: LEFT FEMORAL TO PERONEAL BYPASS GRAFT USING VEIN;  Surgeon: Serene Gaile ORN, MD;  [facility];  Service: Vascular;  Laterality: Left;   CATARACT EXTRACTION Bilateral    CESAREAN SECTION     2   COLONOSCOPY     DILATION AND CURETTAGE OF UTERUS     LOWER EXTREMITY ANGIOGRAPHY N/A 03/11/2024   Procedure: Lower Extremity Angiography;  Surgeon: Magda Debby SAILOR, MD;  [facility];  Service: Cardiovascular;  Laterality: N/A;   PERIPHERAL VASCULAR ATHERECTOMY  06/09/2023   Procedure: PERIPHERAL VASCULAR ATHERECTOMY;  Surgeon: Serene Gaile ORN, MD;  [facility];  Service: Cardiovascular;;   PERIPHERAL VASCULAR BALLOON ANGIOPLASTY  09/30/2022   Procedure: PERIPHERAL VASCULAR BALLOON ANGIOPLASTY;  Surgeon: Serene Gaile ORN, MD;  [facility];  Service: Cardiovascular;;   PERIPHERAL VASCULAR BALLOON ANGIOPLASTY Left 06/10/2023   Procedure: PERIPHERAL VASCULAR BALLOON ANGIOPLASTY;  Surgeon: Lanis Fonda BRAVO, MD;  Location: [facility]  CV LAB;  Service: Cardiovascular;  Laterality: Left;   PERIPHERAL VASCULAR THROMBECTOMY  06/09/2023   Procedure: PERIPHERAL VASCULAR THROMBECTOMY;  Surgeon: Serene Gaile ORN, MD;  [facility];  Service: Cardiovascular;;   PERIPHERAL VASCULAR THROMBECTOMY Left 06/10/2023   Procedure: LYSIS RECHECK;   Surgeon: Lanis Fonda BRAVO, MD;  Location: [facility] INVASIVE CV LAB;  Service: Cardiovascular;  Laterality: Left;   VEIN HARVEST Left 10/30/2021   Procedure: VEIN HARVEST;  Surgeon: Serene Gaile ORN, MD;  Location: [facility] OR;  Service: Vascular;  Laterality: Left;    No Known Allergies   LT foot 04/27/2024  LT foot 04/27/2024  Objective/Physical Exam Dehiscence noted along the amputation site with localized surrounding erythema.  Mild malodor.  Serous drainage noted.  Radiographic Exam LT foot 04/27/2024:  Interval partial ray amputation with clean osteotomy of the first metatarsal.  Previous history of amputation to the third toe at the level of the MTP without any complicating features.  No gas within the tissue noted  Aerobic/Anaerobic Culture w Gram Stain (surgical/deep wound) Order: [id number]  Status: Final result     Next appt: 05/03/2024 at 11:30 AM in Cardiology ([facility])   Test Result Released: Yes (seen)   Specimen Information: Soft Tissue, Other   Component Ref Range & Units (hover) 1 mo ago  Specimen Description TISSUE  Special Requests SOFT TISSUE,OTHER  Gram Stain NO WBC SEEN MODERATE GRAM POSITIVE COCCI MODERATE GRAM NEGATIVE RODS FEW GRAM POSITIVE RODS  Culture ABUNDANT KLEBSIELLA OXYTOCA MODERATE PSEUDOMONAS AERUGINOSA ABUNDANT ENTEROCOCCUS FAECALIS ABUNDANT BACTEROIDES UNIFORMIS BETA LACTAMASE POSITIVE Performed at [facility], [address] [place], [place] [address]  Report Status 03/31/2024 FINAL  Organism ID, Bacteria KLEBSIELLA OXYTOCA  Organism ID, Bacteria PSEUDOMONAS AERUGINOSA  Organism ID, Bacteria ENTEROCOCCUS FAECALIS  Resulting Agency [facility]     Susceptibility   Klebsiella oxytoca Pseudomonas aeruginosa Enterococcus faecalis    MIC MIC MIC    AMPICILLIN  >=32 RESIST... Resistant   <=2 SENSITIVE Sensitive    AMPICILLIN /SULBACTAM 16 INTERMED... Intermediate        CEFEPIME <=0.12 SENS... Sensitive 4 SENSITIVE Sensitive       CEFTAZIDIME   2 SENSITIVE Sensitive      CEFTAZIDIME/AVIBACTAM   2 SENSITIVE Sensitive      CEFTOLOZANE/TAZOBACTAM   0.5 SENSITIVE Sensitive      CEFTRIAXONE <=0.25 SENS... Sensitive        CIPROFLOXACIN <=0.06 SENS... Sensitive 0.12 SENSIT... Sensitive      ERTAPENEM <=0.12 SENS... Sensitive        GENTAMICIN <=1 SENSITIVE Sensitive        GENTAMICIN SYNERGY     SENSITIVE Sensitive    IMIPENEM   2 SENSITIVE Sensitive      MEROPENEM <=0.25 SENS... Sensitive <=0.25 SENS... Sensitive      PIP/TAZO  Sensitive 1  Sensitive 2      TOBRAMYCIN   <=1 SENSITIVE Sensitive      TRIMETH /SULFA  <=20 SENSIT... Sensitive        VANCOMYCIN      1 SENSITIVE Sensitive               1 (Klebsiella oxytoca/PIP/TAZO)   <=4 SENSITIVE This is a modified FDA-approved test that has been validated and its performance characteristics determined by the reporting laboratory.  This laboratory is certified under the Clinical Laboratory Improvement Amendments CLIA as qualified to perform high complexity clinical laboratory testing.  2 (Pseudomonas aeruginosa/PIP/TAZO)   16 SENSITIVE This is a modified FDA-approved test that  has been validated and its performance characteristics determined by the reporting laboratory.  This laboratory is certified under the Clinical Laboratory Improvement Amendments CLIA as qualified to perform high complexity clinical laboratory testing.     Susceptibility Comments  Klebsiella oxytoca  ABUNDANT KLEBSIELLA OXYTOCA  Pseudomonas aeruginosa  MODERATE PSEUDOMONAS AERUGINOSA  Enterococcus faecalis  ABUNDANT ENTEROCOCCUS FAECALIS      Assessment: 1. Procedure: Partial first ray amputation, left foot. DOS: 03/25/2024.  Dr. Malvin   Plan of Care:  -Patient was evaluated. X-rays reviewed -Medically necessary excisional debridement including subcutaneous tissue was performed today using a tissue nipper.  Excisional debridement of the necrotic nonviable tissue down to healthier  bleeding viable tissue was performed with postdebridement measurements same as pre-.  Currently the wound measures approximately 5.0 x 1.5 x 0.5cm.  -Cultures were taken and sent to pathology for culture and sensitivity - Prescribed Augmentin  875/125 mg x 7 days at discharge.  Due to the drainage and mild malodor I did send in a refill of the Augmentin  875/125 mg x 10 days -Discontinue mupirocin  ointment.  Recommend Betadine wet-to-dry dressings daily.  Supplies provided -Continue WBAT surgical shoe -Follow-up with Dr. Malvin roughly in 10 days   Thresa EMERSON Sar, DPM [facility]  Dr. Thresa EMERSON Sar, DPM    [address] [place], [place] [address]                Office [phone]  Fax [phone]

## 2024-04-28 ENCOUNTER — Other Ambulatory Visit: Payer: Self-pay

## 2024-04-28 MED ORDER — RIVAROXABAN 2.5 MG PO TABS: 2.5000 mg | ORAL_TABLET | Freq: Two times a day (BID) | ORAL | 0 refills | Status: AC

## 2024-04-30 LAB — CULTURE,AEROBIC BACTERIA WITH GRAM STAIN
MICRO NUMBER:: 17307453
SPECIMEN QUALITY:: ADEQUATE

## 2024-04-30 LAB — HOUSE ACCOUNT TRACKING

## 2024-05-02 NOTE — Progress Notes (Unsigned)
 [facility] [place]  ASSESSMENT / PLAN: 73 y.o. female with status post left common femoral to peroneal artery bypass with 6mm ePTFE in subfascial tunnel on 03/23/24.  Recommend:  Abstinence from all tobacco products. Blood glucose control with goal A1c < 7%. Blood pressure control with goal blood pressure < 130/80 mmHg. Lipid reduction therapy with goal LDL-C < 55 mg/dL. Aspirin  81mg  by mouth daily. Eliquis  5 mg by mouth twice daily Atorvastatin  40-80mg  PO QD (or other high intensity statin therapy).  Oral antibiotics changed to levofloxacin  to cover Pseudomonas.  Encouraged patient to continue follow-up with Dr. Malvin.  Patient is optimized from a vascular standpoint.  Will see her again in 3 months with repeat noninvasive testing.  CHIEF COMPLAINT: Follow-up from surgery  HISTORY OF PRESENT ILLNESS: Katie Hawkins is a 73 y.o. female who returns to clinic for surveillance after left common femoral to peroneal artery bypass with PTFE.  Patient has undergone a ray amputation of the left first toe which is very slowly healing.  Patient and her husband both report slow progress.  They recently cultured the wound at the podiatrist office which showed Pseudomonas.  No issues with healing in the bypass incisions.  We reviewed her noninvasive testing today.  VASCULAR SURGICAL HISTORY:  status post left common femoral to peroneal artery bypass with 6mm ePTFE in subfascial tunnel on 03/23/24.   -left CFA to peroneal artery bypass with GSV 10/30/2021 Dr. Serene -drug coated balloon angioplasty left leg bypass 09/30/2022 Dr. Serene -mechanical thrombectomy, laser atherectomy left leg bypass, thrombolytic catheter and TPA 06/09/2023 Dr. Serene -drug coated balloon angioplasty left leg byass with peroneal artery angioplasty 06/10/2023 Dr. Lanis.  -left CFA to peroneal artery bypass with 6mm PTFE in subfascial tunnel 03/23/2024 (Nadeem Romanoski).  -partial 1st ray amputation  03/25/2024 Dr. Malvin.  Past Medical History:  Diagnosis Date   Diabetes mellitus    History of blood transfusion    HTN (hypertension), benign 02/13/2012   Hypercholesterolemia    Hyperlipidemia    Hypertension    nodular lymphocyte predominate hodgkins lymphoma dx'd 10/2010   xrt comp 02/20/11   Peripheral vascular disease     Past Surgical History:  Procedure Laterality Date   ABDOMINAL AORTOGRAM W/LOWER EXTREMITY N/A 10/18/2021   Procedure: ABDOMINAL AORTOGRAM W/LOWER EXTREMITY;  Surgeon: Magda Debby SAILOR, MD;  [facility]: [facility];  Service: Cardiovascular;  Laterality: N/A;   ABDOMINAL AORTOGRAM W/LOWER EXTREMITY N/A 09/30/2022   Procedure: ABDOMINAL AORTOGRAM W/LOWER EXTREMITY;  Surgeon: Serene Gaile ORN, MD;  Location: [facility];  Service: Cardiovascular;  Laterality: N/A;   ABDOMINAL AORTOGRAM W/LOWER EXTREMITY N/A 06/09/2023   Procedure: ABDOMINAL AORTOGRAM W/LOWER EXTREMITY;  Surgeon: Serene Gaile ORN, MD;  Location: [facility];  Service: Cardiovascular;  Laterality: N/A;   ABDOMINAL AORTOGRAM W/LOWER EXTREMITY N/A 03/11/2024   Procedure: ABDOMINAL AORTOGRAM W/LOWER EXTREMITY;  Surgeon: Magda Debby SAILOR, MD;  Location: [facility];  Service: Cardiovascular;  Laterality: N/A;   AMPUTATION Left 03/25/2024   Procedure: Partial ray amputation, left great toe;  Surgeon: Malvin Marsa FALCON, DPM;  Location: [facility];  Service: Orthopedics/Podiatry;  Laterality: Left;  Left great toe amputation   AMPUTATION TOE Left 11/01/2021   Procedure: AMPUTATION TOE;  Surgeon: Silva Juliene SAUNDERS, DPM;  Location: [facility];  Service: Podiatry;  Laterality: Left;  will do own block   APPLICATION OF WOUND VAC Left 10/30/2021   Procedure: APPLICATION OF WOUND VAC;  Surgeon: Serene Gaile ORN, MD;  Location: [facility];  Service: Vascular;  Laterality: Left;   BYPASS GRAFT FEMORAL-PERONEAL Left 03/23/2024   Procedure: CREATION, BYPASS, ARTERIAL, COMMON FEMORAL TO PERONEAL,  USING GORTEX GRAFT;  Surgeon: Magda Debby SAILOR, MD;  Location: [facility];  Service: Vascular;  Laterality: Left;   BYPASS GRAFT POPLITEAL TO TIBIAL Left 10/30/2021   Procedure: LEFT FEMORAL TO PERONEAL BYPASS GRAFT USING VEIN;  Surgeon: Serene Gaile ORN, MD;  Location: [facility];  Service: Vascular;  Laterality: Left;   CATARACT EXTRACTION Bilateral    CESAREAN SECTION     2   COLONOSCOPY     DILATION AND CURETTAGE OF UTERUS     LOWER EXTREMITY ANGIOGRAPHY N/A 03/11/2024   Procedure: Lower Extremity Angiography;  Surgeon: Magda Debby SAILOR, MD;  [facility];  Service: Cardiovascular;  Laterality: N/A;   PERIPHERAL VASCULAR ATHERECTOMY  06/09/2023   Procedure: PERIPHERAL VASCULAR ATHERECTOMY;  Surgeon: Serene Gaile ORN, MD;  [facility];  Service: Cardiovascular;;   PERIPHERAL VASCULAR BALLOON ANGIOPLASTY  09/30/2022   Procedure: PERIPHERAL VASCULAR BALLOON ANGIOPLASTY;  Surgeon: Serene Gaile ORN, MD;  [facility];  Service: Cardiovascular;;   PERIPHERAL VASCULAR BALLOON ANGIOPLASTY Left 06/10/2023   Procedure: PERIPHERAL VASCULAR BALLOON ANGIOPLASTY;  Surgeon: Lanis Fonda BRAVO, MD;  Location: [facility];  Service: Cardiovascular;  Laterality: Left;   PERIPHERAL VASCULAR THROMBECTOMY  06/09/2023   Procedure: PERIPHERAL VASCULAR THROMBECTOMY;  Surgeon: Serene Gaile ORN, MD;  [facility];  Service: Cardiovascular;;   PERIPHERAL VASCULAR THROMBECTOMY Left 06/10/2023   Procedure: LYSIS RECHECK;  Surgeon: Lanis Fonda BRAVO, MD;  Location[facility];  Service: Cardiovascular;  Laterality: Left;   VEIN HARVEST Left 10/30/2021   Procedure: VEIN HARVEST;  Surgeon: Serene Gaile ORN, MD;  Location: [facility] OR;  Service: Vascular;  Laterality: Left;    Family History  Problem Relation Age of Onset   Diabetes Father    Cancer Maternal Grandfather        lung   Cancer Paternal Grandfather        leukemia    Social History    Socioeconomic History   Marital status: Married    Spouse name: Not on file   Number of children: Not on file   Years of education: Not on file   Highest education level: Not on file  Occupational History   Not on file  Tobacco Use   Smoking status: Never   Smokeless tobacco: Never  Vaping Use   Vaping status: Never Used  Substance and Sexual Activity   Alcohol use: Yes    Comment: very rarely   Drug use: No   Sexual activity: Not on file  Other Topics Concern   Not on file  Social History Narrative   Not on file   Social Drivers of Health   Financial Resource Strain: Not on file  Food Insecurity: No Food Insecurity (03/23/2024)   Hunger Vital Sign    Worried About Running Out of Food in the Last Year: Never true    Ran Out of Food in the Last Year: Never true  Transportation Needs: No Transportation Needs (03/23/2024)   PRAPARE - Administrator, Civil Service (Medical): No    Lack of Transportation (Non-Medical): No  Physical Activity: Not on file  Stress: Not on file  Social Connections: Socially Integrated (03/23/2024)   Social Connection and Isolation Panel    Frequency of Communication with Friends and Family: Three  times a week    Frequency of Social Gatherings with Friends and Family: More than three times a week    Attends Religious Services: More than 4 times per year    Active Member of [employer] or Organizations: Yes    Attends Engineer, Structural: More than 4 times per year    Marital Status: Married  Catering Manager Violence: Not At Risk (03/23/2024)   Humiliation, Afraid, Rape, and Kick questionnaire    Fear of Current or Ex-Partner: No    Emotionally Abused: No    Physically Abused: No    Sexually Abused: No    No Known Allergies  Current Outpatient Medications  Medication Sig Dispense Refill   amoxicillin -clavulanate (AUGMENTIN ) 875-125 MG tablet Take 1 tablet by mouth 2 (two) times daily. 20 tablet 0   aspirin  EC 81 MG  tablet Take 1 tablet (81 mg total) by mouth daily. Swallow whole. 30 tablet 12   atorvastatin  (LIPITOR) 40 MG tablet Take 1 tablet (40 mg total) by mouth at bedtime. 30 tablet 0   doxycycline  (VIBRA -TABS) 100 MG tablet Take 1 tablet (100 mg total) by mouth every 12 (twelve) hours. 14 tablet 0   ezetimibe  (ZETIA ) 10 MG tablet Take 10 mg by mouth daily.     fluconazole  (DIFLUCAN ) 100 MG tablet Take 1 tablet (100 mg total) by mouth daily. 5 tablet 0   glimepiride (AMARYL) 2 MG tablet Take 2 mg by mouth at bedtime.     Glycerin-Hypromellose-PEG 400 (DRY EYE RELIEF DROPS OP) Place 1 drop into both eyes daily as needed (dry eye).     metFORMIN  (GLUCOPHAGE -XR) 500 MG 24 hr tablet Take 500 mg by mouth 2 (two) times daily.     metoprolol  tartrate (LOPRESSOR ) 25 MG tablet Take 1 tablet (25 mg total) by mouth 2 (two) times daily. 60 tablet 0   Multiple Vitamins-Minerals (MULTIVITAMIN GUMMIES ADULT PO) Take 2 tablets by mouth daily.     mupirocin  ointment (BACTROBAN ) 2 % Apply 1 Application topically daily. 22 g 0   oxyCODONE -acetaminophen  (PERCOCET/ROXICET) 5-325 MG tablet Take 1 tablet by mouth every 6 (six) hours as needed for moderate pain (pain score 4-6). 12 tablet 0   rivaroxaban  (XARELTO ) 2.5 MG TABS tablet Take 1 tablet (2.5 mg total) by mouth 2 (two) times daily. 14 tablet 0   No current facility-administered medications for this visit.    PHYSICAL EXAM Vitals:   05/03/24 1254  BP: 124/60  Pulse: (!) 107  Temp: 98.1 F (36.7 C)  SpO2: 96%  Weight: 162 lb (73.5 kg)  Height: 5' 3 (1.6 m)     PERTINENT LABORATORY AND RADIOLOGIC DATA  Most recent CBC    Latest Ref Rng & Units 03/28/2024    4:07 AM 03/27/2024    8:11 AM 03/26/2024    3:16 AM  CBC  WBC 4.0 - 10.5 K/uL 8.0  12.7  17.4   Hemoglobin 12.0 - 15.0 g/dL 9.0  89.8  89.9   Hematocrit 36.0 - 46.0 % 27.8  31.7  30.7   Platelets 150 - 400 K/uL 374  401  406      Most recent CMP    Latest Ref Rng & Units 03/28/2024    4:07 AM  03/27/2024    8:11 AM 03/26/2024    3:16 AM  CMP  Glucose 70 - 99 mg/dL 817  769  810   BUN 8 - 23 mg/dL 5  9  9    Creatinine 0.44 - 1.00  mg/dL 9.44  9.27  9.37   Sodium 135 - 145 mmol/L 135  130  132   Potassium 3.5 - 5.1 mmol/L 3.5  4.0  3.5   Chloride 98 - 111 mmol/L 100  96  94   CO2 22 - 32 mmol/L 22  16  22    Calcium  8.9 - 10.3 mg/dL 8.3  8.2  8.5   Total Protein 6.5 - 8.1 g/dL 5.7  6.5    Total Bilirubin 0.0 - 1.2 mg/dL 0.7  0.9    Alkaline Phos 38 - 126 U/L 61  78    AST 15 - 41 U/L 97  91    ALT 0 - 44 U/L 96  65      Renal function CrCl cannot be calculated (Patient's most recent lab result is older than the maximum 21 days allowed.).  Hgb A1c MFr Bld (%)  Date Value  03/11/2024 8.6 (H)    LDL Cholesterol  Date Value Ref Range Status  03/24/2024 26 0 - 99 mg/dL Final    Comment:           Total Cholesterol/HDL:CHD Risk Coronary Heart Disease Risk Table                     Men   Women  1/2 Average Risk   3.4   3.3  Average Risk       5.0   4.4  2 X Average Risk   9.6   7.1  3 X Average Risk  23.4   11.0        Use the calculated Patient Ratio above and the CHD Risk Table to determine the patient's CHD Risk.        ATP III CLASSIFICATION (LDL):  <100     mg/dL   Optimal  899-870  mg/dL   Near or Above                    Optimal  130-159  mg/dL   Borderline  839-810  mg/dL   High  >809     mg/dL   Very High Performed at [facility], [address], [place], [place] [address]      +-------+-----------+-----------+------------+------------+  ABI/TBIToday's ABIToday's TBIPrevious ABIPrevious TBI  +-------+-----------+-----------+------------+------------+  Right 0.72       0.22       0.82        0.11          +-------+-----------+-----------+------------+------------+  Left  1.17       amp        0.55        0.00          +-------+-----------+-----------+------------+------------+  Multiphasic waveforms in the left ankle  Left  lower extremity arterial duplex 05/03/2024 shows widely patent left common femoral to peroneal artery bypass without stenosis  Debby SAILOR. Magda, MD [facility] Phone Number: [phone] 05/02/2024 7:57 PM   Total time spent on preparing this encounter including chart review, data review, collecting history, examining the patient, and coordinating care: 40 minutes  Portions of this report may have been transcribed using voice recognition software.  Every effort has been made to ensure accuracy; however, inadvertent computerized transcription errors may still be present.

## 2024-05-03 ENCOUNTER — Encounter: Payer: Self-pay | Admitting: Vascular Surgery

## 2024-05-03 ENCOUNTER — Ambulatory Visit (HOSPITAL_COMMUNITY): Admit: 2024-05-03 | Discharge: 2024-05-03 | Disposition: A | Attending: Surgery | Admitting: Surgery

## 2024-05-03 ENCOUNTER — Ambulatory Visit: Admitting: Vascular Surgery

## 2024-05-03 ENCOUNTER — Ambulatory Visit (HOSPITAL_COMMUNITY): Admission: RE | Admit: 2024-05-03 | Discharge: 2024-05-03 | Disposition: A | Attending: Surgery | Admitting: Surgery

## 2024-05-03 VITALS — BP 124/60 | HR 107 | Temp 98.1°F | Ht 63.0 in | Wt 162.0 lb

## 2024-05-03 DIAGNOSIS — I739 Peripheral vascular disease, unspecified: Secondary | ICD-10-CM

## 2024-05-03 LAB — VAS US ABI WITH/WO TBI
Left ABI: 1.17
Right ABI: 0.72

## 2024-05-03 MED ORDER — LEVOFLOXACIN 750 MG PO TABS: 750.0000 mg | ORAL_TABLET | Freq: Every day | ORAL | 0 refills | Status: AC

## 2024-05-04 ENCOUNTER — Other Ambulatory Visit: Payer: Self-pay

## 2024-05-04 DIAGNOSIS — I70262 Atherosclerosis of native arteries of extremities with gangrene, left leg: Secondary | ICD-10-CM

## 2024-05-10 ENCOUNTER — Encounter: Payer: Self-pay | Admitting: Podiatry

## 2024-05-10 ENCOUNTER — Ambulatory Visit: Admitting: Podiatry

## 2024-05-10 DIAGNOSIS — E08621 Diabetes mellitus due to underlying condition with foot ulcer: Secondary | ICD-10-CM

## 2024-05-10 DIAGNOSIS — Z9889 Other specified postprocedural states: Secondary | ICD-10-CM

## 2024-05-10 DIAGNOSIS — L97422 Non-pressure chronic ulcer of left heel and midfoot with fat layer exposed: Secondary | ICD-10-CM

## 2024-05-10 DIAGNOSIS — M869 Osteomyelitis, unspecified: Secondary | ICD-10-CM

## 2024-05-10 NOTE — Progress Notes (Signed)
°  Subjective:  Patient ID: Katie Hawkins, female    DOB: 01-22-1951,  MRN: [id number]  Chief Complaint  Patient presents with   Wound Check    f/u Partial first ray amputation, left foot. 0 pain. Wearing surgical shoe.     DOS: 03/25/2024 Procedure: Partial first ray amputation, left foot  73 y.o. female seen for post op check.   She is now approximately 6 weeks out from partial first ray amputation left foot.   Has been on antibiotics including a levofloxacin  for pseudomonal infection at the amputation site.  Has been wearing surgical shoe.  Denies pain  Review of Systems: Negative except as noted in the HPI. Denies N/V/F/Ch.   Objective:   Constitutional Well developed. Well nourished.  Vascular Foot warm and well perfused. Capillary refill normal to all digits.   No calf pain with palpation  Neurologic Normal speech. Oriented to person, place, and time. Epicritic sensation intact to left foot  Dermatologic  Amputation site appears improved from prior with decreased maceration has been on appropriate antibiotics and therefore does not appear to have any infection at this time eschar along the amputation site appears dry and stable.     Orthopedic: Status post partial first ray amputation   Radiographs: 1. Interval transmetatarsal amputation of the first digit, with expected postsurgical changes in the soft tissues.  Pathology: A. TOE, LEFT GREAT, AMPUTATION:  - Toe with soft tissue necrosis and underlying acute osteomyelitis.  - Proximal margin with cut surface of bone; negative for acute  osteomyelitis.   Micro:    ABUNDANT KLEBSIELLA OXYTOCA MODERATE PSEUDOMONAS AERUGINOSA ABUNDANT ENTEROCOCCUS FAECALIS ABUNDANT BACTEROIDES UNIFORMIS BETA LACTAMASE POSITIVE    Assessment:   1. Osteomyelitis of great toe of left foot (HCC)   2. 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  Post-operative state      Osteomyelitis left great toe and abscess plantar first MPJ status post partial first ray amputation left foot  Plan:  Patient was evaluated and treated and all questions answered.  6 weeks  s/p partial first ray amputation left foot - Wound is improving with local wound care and debridement much improved today decreased size with no evidence of infection no maceration improved from last visit -Recommend continued wound care with silver alginate style contact layer followed by 4 x 4 gauze Kerlix and Ace wrap. -XR: Expected postoperative changes -WB Status: weightbearing for short distance only in postop shoe -Sutures: Removed previously -Medications/ABX: Levofloxacin  until finished and then will monitor off further antibiotics -Dressing: Recommend every other day silver alginate style dressing left foot - F/u Plan: Follow-up in 3 weeks for recheck        [name], DPM [facility] / [facility]

## 2024-05-24 ENCOUNTER — Ambulatory Visit (INDEPENDENT_AMBULATORY_CARE_PROVIDER_SITE_OTHER): Admitting: Podiatry

## 2024-05-24 ENCOUNTER — Ambulatory Visit (INDEPENDENT_AMBULATORY_CARE_PROVIDER_SITE_OTHER)

## 2024-05-24 ENCOUNTER — Other Ambulatory Visit: Payer: Self-pay | Admitting: Podiatry

## 2024-05-24 DIAGNOSIS — Z9889 Other specified postprocedural states: Secondary | ICD-10-CM

## 2024-05-24 DIAGNOSIS — M869 Osteomyelitis, unspecified: Secondary | ICD-10-CM

## 2024-05-24 DIAGNOSIS — E08621 Diabetes mellitus due to underlying condition with foot ulcer: Secondary | ICD-10-CM

## 2024-05-24 DIAGNOSIS — L97422 Non-pressure chronic ulcer of left heel and midfoot with fat layer exposed: Secondary | ICD-10-CM

## 2024-05-24 MED ORDER — LEVOFLOXACIN 500 MG PO TABS: 500.0000 mg | ORAL_TABLET | Freq: Every day | ORAL | 0 refills | Status: DC

## 2024-05-24 NOTE — Progress Notes (Signed)
 "  Subjective:  Patient ID: Katie Hawkins, female    DOB: 1950-08-12,  MRN: [id number]  Chief Complaint  Patient presents with   Routine Post Op    Partial first ray amputation, left foot. Throbbing pain with minimal drainage started yesterday.A1C 8.8. Silver dressing changes. Finished Levofloxacin .     DOS: 03/25/2024 Procedure: Partial first ray amputation, left foot  73 y.o. female seen for post op check.   She is now approximately 8 weeks out from partial first ray amputation left foot.   Has been on antibiotics including a levofloxacin  for pseudomonal infection at the amputation site.  Has been wearing surgical shoe.  Reports that there has been some pain recently worse yesterday and now improving and previously finished the Levaquin  last week.  Denies seeing any drainage.  Has been doing silver alginate style dressings.  Review of Systems: Negative except as noted in the HPI. Denies N/V/F/Ch.   Objective:   Constitutional Well developed. Well nourished.  Vascular Foot warm and well perfused. Capillary refill normal to all digits.   No calf pain with palpation  Neurologic Normal speech. Oriented to person, place, and time. Epicritic sensation intact to left foot  Dermatologic  Amputation site has mild erythema at the dorsal aspect, there is full-thickness dehiscence in the central aspect with some necrotic tissue and fat present that does probe down to the first metatarsal stump.  No purulence or malodor noted.        Orthopedic: Status post partial first ray amputation   Radiographs: 1. Interval transmetatarsal amputation of the first digit, with expected postsurgical changes in the soft tissues.  05/24/2024 XR 3 views AP lateral oblique of the left foot: Question on the oblique and AP views possible early osseous erosion at the plantar lateral aspect of the first metatarsal amputation stump site  Pathology: A. TOE, LEFT GREAT, AMPUTATION:  - Toe with soft tissue  necrosis and underlying acute osteomyelitis.  - Proximal margin with cut surface of bone; negative for acute  osteomyelitis.   Micro:    ABUNDANT KLEBSIELLA OXYTOCA MODERATE PSEUDOMONAS AERUGINOSA ABUNDANT ENTEROCOCCUS FAECALIS ABUNDANT BACTEROIDES UNIFORMIS BETA LACTAMASE POSITIVE    Assessment:   1. Osteomyelitis of great toe of left foot (HCC)   2. 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 Post-operative state    Osteomyelitis left great toe and abscess plantar first MPJ status post partial first ray amputation left foot  Postoperative wound dehiscence 2/2 necrosis at the amputation site, concern for possible underlying osteomyelitis chronic  Plan:  Patient was evaluated and treated and all questions answered.  8 weeks  s/p partial first ray amputation left foot - Wound unfortunately has full-thickness dehiscence that probes to first metatarsal stump significantly with some necrotic tissue surrounding.  Concern for inadequate arterial flow for healing at this area.  No obvious gross infection noted though concern for early onset cellulitis dorsally -Recommend infectious disease referral given concern for possible residual osseous infection in the first met stump, ordered ESR and CRP labs -Debrided the necrotic tissues from the central aspect of the wound and proximal aspect to healthy bleeding tissue. -Recommend daily iodoform packing quarter inch to the central aspect and cover the rest of the wound with silver alginate dressing gauze -XR: Questionable early osseous erosions at the first metatarsal stump site though difficult to fully assess on these films -WB Status: weightbearing for short distance only in postop  shoe -Sutures: Removed previously -Medications/ABX: E Rx for Levaquin  10 days -Dressing: Dressings as above including iodoform packing and silver alginate - F/u Plan: Follow-up in  2 weeks for recheck        Katie Hawkins, DPM [facility] / [facility]  "

## 2024-05-25 LAB — SEDIMENTATION RATE: Sed Rate: 43 mm/h — ABNORMAL HIGH (ref 0–30)

## 2024-05-25 LAB — C-REACTIVE PROTEIN: CRP: 30.5 mg/L — ABNORMAL HIGH

## 2024-05-30 ENCOUNTER — Telehealth: Payer: Self-pay | Admitting: Podiatry

## 2024-05-30 ENCOUNTER — Encounter (HOSPITAL_COMMUNITY): Payer: Self-pay

## 2024-05-30 ENCOUNTER — Inpatient Hospital Stay (HOSPITAL_COMMUNITY)

## 2024-05-30 ENCOUNTER — Emergency Department (HOSPITAL_COMMUNITY)

## 2024-05-30 ENCOUNTER — Other Ambulatory Visit: Payer: Self-pay

## 2024-05-30 ENCOUNTER — Inpatient Hospital Stay (HOSPITAL_COMMUNITY)
Admission: EM | Admit: 2024-05-30 | Discharge: 2024-06-02 | DRG: 475 | Disposition: A | Discharge status: Home / Self Care | Attending: Internal Medicine | Admitting: Internal Medicine

## 2024-05-30 DIAGNOSIS — E1169 Type 2 diabetes mellitus with other specified complication: Secondary | ICD-10-CM | POA: Diagnosis present

## 2024-05-30 DIAGNOSIS — Z89412 Acquired absence of left great toe: Secondary | ICD-10-CM

## 2024-05-30 DIAGNOSIS — I152 Hypertension secondary to endocrine disorders: Secondary | ICD-10-CM | POA: Diagnosis present

## 2024-05-30 DIAGNOSIS — E11628 Type 2 diabetes mellitus with other skin complications: Secondary | ICD-10-CM | POA: Diagnosis present

## 2024-05-30 DIAGNOSIS — E7849 Other hyperlipidemia: Secondary | ICD-10-CM | POA: Diagnosis present

## 2024-05-30 DIAGNOSIS — Y835 Amputation of limb(s) as the cause of abnormal reaction of the patient, or of later complication, without mention of misadventure at the time of the procedure: Secondary | ICD-10-CM | POA: Diagnosis present

## 2024-05-30 DIAGNOSIS — Z833 Family history of diabetes mellitus: Secondary | ICD-10-CM | POA: Diagnosis not present

## 2024-05-30 DIAGNOSIS — E785 Hyperlipidemia, unspecified: Secondary | ICD-10-CM | POA: Diagnosis not present

## 2024-05-30 DIAGNOSIS — I739 Peripheral vascular disease, unspecified: Secondary | ICD-10-CM | POA: Diagnosis present

## 2024-05-30 DIAGNOSIS — E7800 Pure hypercholesterolemia, unspecified: Secondary | ICD-10-CM | POA: Diagnosis present

## 2024-05-30 DIAGNOSIS — Z8571 Personal history of Hodgkin lymphoma: Secondary | ICD-10-CM | POA: Diagnosis not present

## 2024-05-30 DIAGNOSIS — T148XXA Other injury of unspecified body region, initial encounter: Secondary | ICD-10-CM

## 2024-05-30 DIAGNOSIS — E1159 Type 2 diabetes mellitus with other circulatory complications: Secondary | ICD-10-CM | POA: Diagnosis present

## 2024-05-30 DIAGNOSIS — L089 Local infection of the skin and subcutaneous tissue, unspecified: Principal | ICD-10-CM

## 2024-05-30 DIAGNOSIS — I10 Essential (primary) hypertension: Secondary | ICD-10-CM | POA: Diagnosis not present

## 2024-05-30 DIAGNOSIS — M86272 Subacute osteomyelitis, left ankle and foot: Secondary | ICD-10-CM | POA: Diagnosis present

## 2024-05-30 DIAGNOSIS — Z79899 Other long term (current) drug therapy: Secondary | ICD-10-CM | POA: Diagnosis not present

## 2024-05-30 DIAGNOSIS — E440 Moderate protein-calorie malnutrition: Secondary | ICD-10-CM | POA: Diagnosis present

## 2024-05-30 DIAGNOSIS — C8190 Hodgkin lymphoma, unspecified, unspecified site: Secondary | ICD-10-CM | POA: Diagnosis not present

## 2024-05-30 DIAGNOSIS — I5032 Chronic diastolic (congestive) heart failure: Secondary | ICD-10-CM | POA: Diagnosis present

## 2024-05-30 DIAGNOSIS — E1152 Type 2 diabetes mellitus with diabetic peripheral angiopathy with gangrene: Secondary | ICD-10-CM | POA: Diagnosis present

## 2024-05-30 DIAGNOSIS — Z7984 Long term (current) use of oral hypoglycemic drugs: Secondary | ICD-10-CM | POA: Diagnosis not present

## 2024-05-30 DIAGNOSIS — L97529 Non-pressure chronic ulcer of other part of left foot with unspecified severity: Secondary | ICD-10-CM | POA: Diagnosis present

## 2024-05-30 DIAGNOSIS — E118 Type 2 diabetes mellitus with unspecified complications: Secondary | ICD-10-CM | POA: Diagnosis present

## 2024-05-30 DIAGNOSIS — L03116 Cellulitis of left lower limb: Secondary | ICD-10-CM | POA: Diagnosis present

## 2024-05-30 DIAGNOSIS — M869 Osteomyelitis, unspecified: Secondary | ICD-10-CM | POA: Diagnosis not present

## 2024-05-30 DIAGNOSIS — E1151 Type 2 diabetes mellitus with diabetic peripheral angiopathy without gangrene: Secondary | ICD-10-CM | POA: Diagnosis not present

## 2024-05-30 DIAGNOSIS — E11621 Type 2 diabetes mellitus with foot ulcer: Secondary | ICD-10-CM | POA: Diagnosis present

## 2024-05-30 DIAGNOSIS — L723 Sebaceous cyst: Secondary | ICD-10-CM | POA: Diagnosis present

## 2024-05-30 DIAGNOSIS — Z9842 Cataract extraction status, left eye: Secondary | ICD-10-CM

## 2024-05-30 DIAGNOSIS — E1165 Type 2 diabetes mellitus with hyperglycemia: Secondary | ICD-10-CM | POA: Diagnosis present

## 2024-05-30 DIAGNOSIS — Z7901 Long term (current) use of anticoagulants: Secondary | ICD-10-CM | POA: Diagnosis not present

## 2024-05-30 DIAGNOSIS — T8781 Dehiscence of amputation stump: Secondary | ICD-10-CM | POA: Diagnosis present

## 2024-05-30 DIAGNOSIS — Z7982 Long term (current) use of aspirin: Secondary | ICD-10-CM | POA: Diagnosis not present

## 2024-05-30 DIAGNOSIS — Z89422 Acquired absence of other left toe(s): Secondary | ICD-10-CM | POA: Diagnosis not present

## 2024-05-30 DIAGNOSIS — Z6827 Body mass index (BMI) 27.0-27.9, adult: Secondary | ICD-10-CM

## 2024-05-30 DIAGNOSIS — Y838 Other surgical procedures as the cause of abnormal reaction of the patient, or of later complication, without mention of misadventure at the time of the procedure: Secondary | ICD-10-CM | POA: Diagnosis present

## 2024-05-30 DIAGNOSIS — Z9841 Cataract extraction status, right eye: Secondary | ICD-10-CM

## 2024-05-30 DIAGNOSIS — B958 Unspecified staphylococcus as the cause of diseases classified elsewhere: Secondary | ICD-10-CM | POA: Diagnosis not present

## 2024-05-30 DIAGNOSIS — M868X7 Other osteomyelitis, ankle and foot: Secondary | ICD-10-CM | POA: Diagnosis not present

## 2024-05-30 DIAGNOSIS — Z89432 Acquired absence of left foot: Secondary | ICD-10-CM | POA: Diagnosis not present

## 2024-05-30 DIAGNOSIS — Z89442 Acquired absence of left ankle: Secondary | ICD-10-CM | POA: Diagnosis not present

## 2024-05-30 LAB — BASIC METABOLIC PANEL WITH GFR
Anion gap: 12 (ref 5–15)
BUN: 13 mg/dL (ref 8–23)
CO2: 26 mmol/L (ref 22–32)
Calcium: 9.4 mg/dL (ref 8.9–10.3)
Chloride: 98 mmol/L (ref 98–111)
Creatinine, Ser: 0.66 mg/dL (ref 0.44–1.00)
GFR, Estimated: >60 mL/min
Glucose, Bld: 261 mg/dL — ABNORMAL HIGH (ref 70–99)
Potassium: 4.5 mmol/L (ref 3.5–5.1)
Sodium: 135 mmol/L (ref 135–145)

## 2024-05-30 LAB — CBC WITH DIFFERENTIAL/PLATELET
Abs Immature Granulocytes: 0.08 K/uL — ABNORMAL HIGH (ref 0.00–0.07)
Basophils Absolute: 0.1 K/uL (ref 0.0–0.1)
Basophils Relative: 1 %
Eosinophils Absolute: 0 K/uL (ref 0.0–0.5)
Eosinophils Relative: 0 %
HCT: 39 % (ref 36.0–46.0)
Hemoglobin: 12.7 g/dL (ref 12.0–15.0)
Immature Granulocytes: 1 %
Lymphocytes Relative: 18 %
Lymphs Abs: 2.1 K/uL (ref 0.7–4.0)
MCH: 29.7 pg (ref 26.0–34.0)
MCHC: 32.6 g/dL (ref 30.0–36.0)
MCV: 91.1 fL (ref 80.0–100.0)
Monocytes Absolute: 1 K/uL (ref 0.1–1.0)
Monocytes Relative: 8 %
Neutro Abs: 8.3 K/uL — ABNORMAL HIGH (ref 1.7–7.7)
Neutrophils Relative %: 72 %
Platelets: 343 K/uL (ref 150–400)
RBC: 4.28 MIL/uL (ref 3.87–5.11)
RDW: 14 % (ref 11.5–15.5)
WBC: 11.4 K/uL — ABNORMAL HIGH (ref 4.0–10.5)
nRBC: 0 % (ref 0.0–0.2)

## 2024-05-30 LAB — I-STAT CG4 LACTIC ACID, ED: Lactic Acid, Venous: 0.9 mmol/L (ref 0.5–1.9)

## 2024-05-30 LAB — CBG MONITORING, ED
Glucose-Capillary: 129 mg/dL — ABNORMAL HIGH (ref 70–99)
Glucose-Capillary: 140 mg/dL — ABNORMAL HIGH (ref 70–99)

## 2024-05-30 MED ORDER — ASPIRIN 81 MG PO TBEC
81.0000 mg | DELAYED_RELEASE_TABLET | Freq: Every day | ORAL | Status: DC
  Administered 2024-05-31 – 2024-06-02 (×3): 81 mg via ORAL
  Filled 2024-05-30 (×3): qty 1

## 2024-05-30 MED ORDER — ACETAMINOPHEN 325 MG PO TABS: 650.0000 mg | ORAL_TABLET | Freq: Four times a day (QID) | ORAL | Status: DC | PRN

## 2024-05-30 MED ORDER — SODIUM CHLORIDE 0.9 % IV BOLUS
1000.0000 mL | Freq: Once | INTRAVENOUS | Status: AC
  Administered 2024-05-30: 1000 mL via INTRAVENOUS

## 2024-05-30 MED ORDER — METOPROLOL TARTRATE 25 MG PO TABS
25.0000 mg | ORAL_TABLET | Freq: Two times a day (BID) | ORAL | Status: DC
  Administered 2024-05-30 – 2024-06-02 (×6): 25 mg via ORAL
  Filled 2024-05-30 (×6): qty 1

## 2024-05-30 MED ORDER — LIDOCAINE-EPINEPHRINE (PF) 2 %-1:200000 IJ SOLN
10.0000 mL | Freq: Once | INTRAMUSCULAR | Status: AC
  Administered 2024-05-31: 10 mL
  Filled 2024-05-30: qty 20

## 2024-05-30 MED ORDER — SODIUM CHLORIDE 0.9 % IV SOLN: INTRAVENOUS | Status: AC

## 2024-05-30 MED ORDER — FENTANYL CITRATE (PF) 50 MCG/ML IJ SOSY: 12.5000 ug | PREFILLED_SYRINGE | INTRAMUSCULAR | Status: DC | PRN

## 2024-05-30 MED ORDER — LORAZEPAM 1 MG PO TABS
1.0000 mg | ORAL_TABLET | Freq: Once | ORAL | Status: AC
  Administered 2024-05-30: 1 mg via ORAL
  Filled 2024-05-30: qty 1

## 2024-05-30 MED ORDER — ACETAMINOPHEN 650 MG RE SUPP: 650.0000 mg | Freq: Four times a day (QID) | RECTAL | Status: DC | PRN

## 2024-05-30 MED ORDER — ONDANSETRON HCL 4 MG/2ML IJ SOLN: 4.0000 mg | Freq: Four times a day (QID) | INTRAMUSCULAR | Status: DC | PRN

## 2024-05-30 MED ORDER — ONDANSETRON HCL 4 MG PO TABS: 4.0000 mg | ORAL_TABLET | Freq: Four times a day (QID) | ORAL | Status: DC | PRN

## 2024-05-30 MED ORDER — SODIUM CHLORIDE 0.9 % IV SOLN
2.0000 g | Freq: Three times a day (TID) | INTRAVENOUS | Status: DC
  Administered 2024-05-30 – 2024-06-02 (×8): 2 g via INTRAVENOUS
  Filled 2024-05-30 (×9): qty 12.5

## 2024-05-30 MED ORDER — INSULIN ASPART 100 UNIT/ML IJ SOLN
0.0000 [IU] | INTRAMUSCULAR | Status: DC
  Administered 2024-05-30 (×2): 1 [IU] via SUBCUTANEOUS
  Administered 2024-05-31 (×2): 2 [IU] via SUBCUTANEOUS
  Administered 2024-05-31: 1 [IU] via SUBCUTANEOUS
  Administered 2024-05-31 (×2): 3 [IU] via SUBCUTANEOUS
  Administered 2024-06-01: 2 [IU] via SUBCUTANEOUS
  Administered 2024-06-01: 3 [IU] via SUBCUTANEOUS
  Administered 2024-06-01 (×2): 1 [IU] via SUBCUTANEOUS
  Administered 2024-06-01: 7 [IU] via SUBCUTANEOUS
  Administered 2024-06-02: 2 [IU] via SUBCUTANEOUS
  Administered 2024-06-02: 3 [IU] via SUBCUTANEOUS
  Administered 2024-06-02: 1 [IU] via SUBCUTANEOUS
  Filled 2024-05-30 (×2): qty 1
  Filled 2024-05-30: qty 3
  Filled 2024-05-30: qty 1
  Filled 2024-05-30: qty 2
  Filled 2024-05-30 (×8): qty 1

## 2024-05-30 MED ORDER — VANCOMYCIN HCL 2000 MG/400ML IV SOLN
2000.0000 mg | Freq: Once | INTRAVENOUS | Status: AC
  Administered 2024-05-30: 2000 mg via INTRAVENOUS
  Filled 2024-05-30: qty 400

## 2024-05-30 MED ORDER — EZETIMIBE 10 MG PO TABS
10.0000 mg | ORAL_TABLET | Freq: Every day | ORAL | Status: DC
  Administered 2024-05-31 – 2024-06-02 (×3): 10 mg via ORAL
  Filled 2024-05-30 (×3): qty 1

## 2024-05-30 MED ORDER — HYDROCODONE-ACETAMINOPHEN 5-325 MG PO TABS
1.0000 | ORAL_TABLET | ORAL | Status: DC | PRN
  Administered 2024-05-30 – 2024-05-31 (×2): 1 via ORAL
  Administered 2024-06-02 (×2): 2 via ORAL
  Filled 2024-05-30: qty 2
  Filled 2024-05-30: qty 1
  Filled 2024-05-30: qty 2
  Filled 2024-05-30: qty 1

## 2024-05-30 MED ORDER — VANCOMYCIN HCL 1250 MG/250ML IV SOLN
1250.0000 mg | INTRAVENOUS | Status: DC
  Administered 2024-05-31 – 2024-06-01 (×2): 1250 mg via INTRAVENOUS
  Filled 2024-05-30 (×2): qty 250

## 2024-05-30 MED ORDER — ATORVASTATIN CALCIUM 40 MG PO TABS
40.0000 mg | ORAL_TABLET | Freq: Every day | ORAL | Status: DC
  Administered 2024-05-30 – 2024-06-01 (×3): 40 mg via ORAL
  Filled 2024-05-30 (×3): qty 1

## 2024-05-30 NOTE — Assessment & Plan Note (Signed)
Order sliding scale  

## 2024-05-30 NOTE — Assessment & Plan Note (Signed)
 Chronic currently in remission

## 2024-05-30 NOTE — ED Notes (Incomplete)
 Pt provided assistance with using the urinal. Room cleaned. Pt provided a sierra mist

## 2024-05-30 NOTE — ED Triage Notes (Signed)
 Pt sent from doctors office for concern for infection to her left foot.

## 2024-05-30 NOTE — Assessment & Plan Note (Signed)
"   suspicious for ulcer infection History of Pseudomonas Now cefepime  Vanco May benefit from ID consult Podiatry is on board plan for operative intervention on Wednesday Will need to be n.p.o. Tuesday night "

## 2024-05-30 NOTE — ED Notes (Signed)
 2nd lactic not needed, per PA

## 2024-05-30 NOTE — Assessment & Plan Note (Signed)
Resume metoprolol 25 mg twice daily 

## 2024-05-30 NOTE — Telephone Encounter (Signed)
 Patient called stating that she has been in the ED since 12:30. Patient states theres no rooms available and she doesn't want to wait longer. Please advise.

## 2024-05-30 NOTE — Progress Notes (Signed)
 Pharmacy Antibiotic Note  Katie Hawkins is a 74 y.o. female for which pharmacy has been consulted for cefepime  and vancomycin  dosing for osteomyelitis. Patient presenting with left foot pain and swelling .  SCr 0.66  WBC 11.4; LA 0.9; T 98; HR 101; RR 18  Plan: Cefepime  2g q8hr  Vancomycin  2000 mg once then 1250 mg q24hr (eAUC 505.2) unless change in renal function Monitor WBC, fever, renal function, cultures De-escalate when able Levels at steady state     Temp (24hrs), Avg:98.1 F (36.7 C), Min:98 F (36.7 C), Max:98.1 F (36.7 C)  Recent Labs  Lab 05/30/24 1348 05/30/24 1353  WBC 11.4*  --   CREATININE 0.66  --   LATICACIDVEN  --  0.9    CrCl cannot be calculated (Unknown ideal weight.).    Allergies[1]  Microbiology results: Pending  Thank you for allowing pharmacy to be a part of this patients care.  Dorn Buttner, PharmD, BCPS 05/30/2024 7:51 PM ED Clinical Pharmacist -  [phone]      [1] No Known Allergies

## 2024-05-30 NOTE — Assessment & Plan Note (Signed)
 Status post amputation now with complication of dehiscence unclear if there is residual infection. But dietary has been consulted. Obtain MRI of the stump Given potential wound infection order cefepime  and vancomycin  history of Pseudomonas

## 2024-05-30 NOTE — Assessment & Plan Note (Signed)
Continue Lipitor 40 mg a day

## 2024-05-30 NOTE — Subjective & Objective (Signed)
 Patient with left foot infection chronic followed by Podiatry dr. Malvin Has known history of underlying osteomyelitis of the great toe of her left foot She is  8 weeks out from partial first ray amputation left foot.  Has been on outpatient antibiotic regimen history of Pseudomonas has been on levofloxacin  but continues to have worsening swelling and pain she just finished her antibiotics last week She developed wound dehiscence has known history of peripheral vascular disease She was seen in clinic by podiatry on 30 December and was started on another course of Levaquin  but unfortunately continued to have significant pain and swelling which is what brought her to emergency department today podiatry was consulted will see patient in consult Possibly will operate on Wednesday recommend obtaining MRI Palpable pulses noted In emergency department she was started on Vanc and cefepime  given history of Pseudomonas  Of note patient does have a infectious disease follow-up as an outpatient on 15

## 2024-05-30 NOTE — H&P (Signed)
 "    Katie Hawkins FMW:[id number] DOB: 11/29/1950 DOA: 05/30/2024     PCP: Claudene Pellet, MD     Patient arrived to ER on 05/30/24 at 1230 Referred by Attending Elnor Jayson LABOR, DO   Patient coming from:    home Lives   With family     Chief Complaint:   Chief Complaint  Patient presents with   Wound Infection    HPI: Katie Hawkins is a 74 y.o. female with medical history significant of DM2, peripheral vascular disease involving left lower extremity status post bypass, HLD, osteomyelitis of the left great toe status post amputation by podiatry, history of Hodgkin's lymphoma    Presented with left foot pain and swelling Patient with left foot infection chronic followed by Podiatry dr. Malvin Has known history of underlying osteomyelitis of the great toe of her left foot She is  8 weeks out from partial first ray amputation left foot.  Has been on outpatient antibiotic regimen history of Pseudomonas has been on levofloxacin  but continues to have worsening swelling and pain she just finished her antibiotics last week She developed wound dehiscence has known history of peripheral vascular disease She was seen in clinic by podiatry on 30 December and was started on another course of Levaquin  but unfortunately continued to have significant pain and swelling which is what brought her to emergency department today podiatry was consulted will see patient in consult Possibly will operate on Wednesday recommend obtaining MRI Palpable pulses noted In emergency department she was started on Vanc and cefepime  given history of Pseudomonas  Of note patient does have a infectious disease follow-up as an outpatient on 15    Patient has history of peripheral vascular disease she underwent went aortogram on October 2025 by Dr. Magda known occluded LLE bypass with occlusion of native left popliteal AT and PT arteries with single-vessel peroneal runoff  She has history of left femoral to  peroneal bypass for gangrene in June 2023 by Dr. Delores home and then required endovascular intervention in May 2024 again and then again in January 2025 but bypass suffered silent occlusion She undergone left  CFA to peroneal artery bypass with 6mm PTFE in subfascial tunnel 03/23/2024 Dr. Magda.  Patient has been medically treated on aspirin  statin and xarelto    Denies significant ETOH intake   Does not smoke      Regarding pertinent Chronic problems:    Hyperlipidemia -  on statins Lipitor (atorvastatin ) Zetia  Lipid Panel     Component Value Date/Time   CHOL 80 03/24/2024 0331   TRIG 100 03/24/2024 0331   HDL 34 (L) 03/24/2024 0331   CHOLHDL 2.4 03/24/2024 0331   VLDL 20 03/24/2024 0331   LDLCALC 26 03/24/2024 0331     HTN on toprol    chronic CHF diastolic - last echo  Recent Results (from the past 56199 hours)  ECHOCARDIOGRAM COMPLETE   Collection Time: 03/28/24 10:56 AM  Result Value   Weight 2,666.68   BP 117/52   S' Lateral 2.70   Area-P 1/2 5.06   MV VTI 1.33   Est EF 55 - 60%   Narrative      ECHOCARDIOGRAM REPORT       1. Left ventricular ejection fraction, by estimation, is 55 to 60%. The left ventricle has normal function. The left ventricle has no regional wall motion abnormalities. Left ventricular diastolic parameters are consistent with Grade II diastolic  dysfunction (pseudonormalization).  2. Right ventricular systolic function is normal.  The right ventricular size is normal.  3. The mitral valve is normal in structure. No evidence of mitral valve regurgitation. Moderate mitral stenosis. The mean mitral valve gradient is 8.0 mmHg. Severe mitral annular calcification.  4. The aortic valve is normal in structure. There is moderate calcification of the aortic valve. Aortic valve regurgitation is not visualized. No aortic stenosis is present.  5. The inferior vena cava is normal in size with <50% respiratory variability, suggesting right atrial pressure of 8  mmHg.                 DM 2 -  Lab Results  Component Value Date   HGBA1C 8.6 (H) 03/11/2024   on PO meds only,     PVD followed by vascular - on aspirin  Xarelto  and Lipitor      Chronic anemia - baseline hg Hemoglobin & Hematocrit  Recent Labs    03/27/24 0811 03/28/24 0407 05/30/24 1348  HGB 10.1* 9.0* 12.7   Iron/TIBC/Ferritin/ %Sat No results found for: IRON, TIBC, FERRITIN, IRONPCTSAT   Cancer: History of Hodgkin's lymphoma     While in ER:   Plain imaging showing status post transmetatarsal amputation of the first digit and amputation of the third digit and with soft tissue ulceration Podiatry has been consulted    Lab Orders         Blood culture (routine x 2)         CBC with Differential         Basic metabolic panel       Following Medications were ordered in ER: Medications  vancomycin  (VANCOREADY) IVPB 2000 mg/400 mL (has no administration in time range)  ceFEPIme  (MAXIPIME ) 2 g in sodium chloride  0.9 % 100 mL IVPB (2 g Intravenous New Bag/Given 05/30/24 1832)  sodium chloride  0.9 % bolus 1,000 mL (1,000 mLs Intravenous New Bag/Given 05/30/24 1835)  LORazepam  (ATIVAN ) tablet 1 mg (1 mg Oral Given 05/30/24 1830)    _______________________________________________________ ER Provider Called:    Podiatry Dr. Malvin They Recommend admit to medicine   Will see in AM      ED Triage Vitals  Encounter Vitals Group     BP 05/30/24 1234 (!) 161/71     Girls Systolic BP Percentile --      Girls Diastolic BP Percentile --      Boys Systolic BP Percentile --      Boys Diastolic BP Percentile --      Pulse Rate 05/30/24 1234 (!) 119     Resp 05/30/24 1234 18     Temp 05/30/24 1234 98.1 F (36.7 C)     Temp Source 05/30/24 1929 Oral     SpO2 05/30/24 1234 100 %     Weight --      Height --      Head Circumference --      Peak Flow --      Pain Score 05/30/24 1328 4     Pain Loc --      Pain Education --      Exclude from Growth Chart --    UFJK(75)@     _________________________________________ Significant initial  Findings: Abnormal Labs Reviewed  CBC WITH DIFFERENTIAL/PLATELET - Abnormal; Notable for the following components:      Result Value   WBC 11.4 (*)    Neutro Abs 8.3 (*)    Abs Immature Granulocytes 0.08 (*)    All other components within normal limits  BASIC METABOLIC PANEL WITH GFR -  Abnormal; Notable for the following components:   Glucose, Bld 261 (*)    All other components within normal limits        ECG: Ordered    The recent clinical data is shown below. Vitals:   05/30/24 1234 05/30/24 1929  BP: (!) 161/71 (!) 133/53  Pulse: (!) 119 (!) 101  Resp: 18 18  Temp: 98.1 F (36.7 C) 98 F (36.7 C)  TempSrc:  Oral  SpO2: 100% 100%    WBC     Component Value Date/Time   WBC 11.4 (H) 05/30/2024 1348   LYMPHSABS 2.1 05/30/2024 1348   LYMPHSABS 1.7 02/20/2014 1159   MONOABS 1.0 05/30/2024 1348   MONOABS 0.8 02/20/2014 1159   EOSABS 0.0 05/30/2024 1348   EOSABS 0.1 02/20/2014 1159   BASOSABS 0.1 05/30/2024 1348   BASOSABS 0.1 02/20/2014 1159     Lactic Acid, Venous    Component Value Date/Time   LATICACIDVEN 0.9 05/30/2024 1353      Results for orders placed or performed in visit on 04/27/24  Culture aerobic bacteria pos     Status: Abnormal   Collection Time: 04/27/24  3:05 PM  Result Value Ref Range Status   MICRO NUMBER: [id number]  Final   SPECIMEN QUALITY: Adequate  Final   SOURCE: NOT GIVEN  Final   STATUS: FINAL  Final   GRAM STAIN:   Final    No white blood cells seen No epithelial cells seen Few Gram positive cocci in pairs Moderate Gram negative bacilli   ISOLATE 1: Pseudomonas aeruginosa (A)  Final    Comment: Heavy growth of Pseudomonas aeruginosa   COMMENT:   Final    No source was provided. The specimen was tested and reported based upon the test code ordered. If this is incorrect, please contact client services.      Susceptibility   Pseudomonas aeruginosa -  AEROBIC CULT, GRAM STAIN NEGATIVE 1    AMIKACIN 4 Sensitive     CEFTAZIDIME 2 Sensitive     CEFEPIME  2 Sensitive     CIPROFLOXACIN 0.12 Sensitive     LEVOFLOXACIN  0.25 Sensitive     GENTAMICIN 2 Sensitive     IMIPENEM >=16 Resistant     MEROPENEM <=0.25 Intermediate     PIP/TAZO* 16 Sensitive      * Legend: S = Susceptible  I = Intermediate R = Resistant  NS = Not susceptible SDD = Susceptible Dose Dependent * = Not Tested  NR = Not Reported **NN = See Therapy Comments     ABX started Antibiotics Given (last 72 hours)     Date/Time Action Medication Dose Rate   05/30/24 1832 New Bag/Given   ceFEPIme  (MAXIPIME ) 2 g in sodium chloride  0.9 % 100 mL IVPB 2 g 200 mL/hr        Susceptibility data from last 90 days. Collected Specimen Info Organism Amikacin AMPICILLIN  AMPICILLIN /SULBACTAM CEFEPIME  Ceftazidime CEFTAZIDIME/AVIBACTAM CEFTOLOZANE/TAZOBACTAM CEFTRIAXONE Ciprofloxacin Ertapenem Gentamicin Susc lslt  04/27/24  Pseudomonas aeruginosa  S    S  S     S   S  03/25/24 Path Tissue Klebsiella oxytoca   R  I  S     S  S  S  S    Pseudomonas aeruginosa     S  S  S  S   S      Enterococcus faecalis   S            Collected Specimen Info Organism GENTAMICIN SYNERGY Imipenem  LEVOFLOXACIN  Meropenem Piperacillin  + Tazobactam TELAVANCIN Tobramycin Susc lslt Trimethoprim /Sulfa   04/27/24  Pseudomonas aeruginosa   R  S  I  S     03/25/24 Path Tissue Klebsiella oxytoca     S S    S    Pseudomonas aeruginosa   S   S S   S     Enterococcus faecalis  S      S     ________________________________________ Recent Labs  Lab 05/30/24 1348  NA 135  K 4.5  CO2 26  GLUCOSE 261*  BUN 13  CREATININE 0.66  CALCIUM  9.4    Cr   stable,    Lab Results  Component Value Date   CREATININE 0.66 05/30/2024   CREATININE 0.55 03/28/2024   CREATININE 0.72 03/27/2024    No results for input(s): AST, ALT, ALKPHOS, BILITOT, PROT, ALBUMIN in the last 168 hours. Lab Results  Component  Value Date   CALCIUM  9.4 05/30/2024   PHOS 3.1 03/28/2024    Plt: Lab Results  Component Value Date   PLT 343 05/30/2024       Recent Labs  Lab 05/30/24 1348  WBC 11.4*  NEUTROABS 8.3*  HGB 12.7  HCT 39.0  MCV 91.1  PLT 343    HG/HCT stable,       Component Value Date/Time   HGB 12.7 05/30/2024 1348   HGB 13.4 02/20/2014 1159   HCT 39.0 05/30/2024 1348   HCT 41.0 02/20/2014 1159   MCV 91.1 05/30/2024 1348   MCV 89.8 02/20/2014 1159    _______________________________________________ Hospitalist was called for admission for   Wound infection   The following Work up has been ordered so far:  Orders Placed This Encounter  Procedures   Blood culture (routine x 2)   DG Foot Complete Left   MR FOOT LEFT WO CONTRAST   CBC with Differential   Basic metabolic panel   Consult to podiatry Consult Timeframe: STAT - requires a response within one hour; STAT timeframe requires provider to provider communication, has the provider to provider communication been completed: Yes; Reason for Consult? wound infection   Consult to hospitalist     OTHER Significant initial  Findings:  labs showing:     DM  labs:  HbA1C: Recent Labs    03/11/24 0318  HGBA1C 8.6*       CBG (last 3)  No results for input(s): GLUCAP in the last 72 hours.        Cultures:    Component Value Date/Time   SDES TISSUE 03/25/2024 1631   SPECREQUEST SOFT TISSUE,OTHER 03/25/2024 1631   CULT  03/25/2024 1631    ABUNDANT KLEBSIELLA OXYTOCA MODERATE PSEUDOMONAS AERUGINOSA ABUNDANT ENTEROCOCCUS FAECALIS ABUNDANT BACTEROIDES UNIFORMIS BETA LACTAMASE POSITIVE Performed at [facility], [address], [place], [place] [address]    REPTSTATUS 03/31/2024 FINAL 03/25/2024 1631     Radiological Exams on Admission: DG Foot Complete Left Result Date: 05/30/2024 EXAM: 3 OR MORE VIEW(S) XRAY OF THE LEFT FOOT 05/30/2024 02:11:00 PM COMPARISON: 03/25/2024 CLINICAL HISTORY: known osteomyelitis to  R foot, s/p amputation, concerns for recurrence FINDINGS: BONES AND JOINTS: Status post transmetatarsal amputation of first digit. Status post third digit amputation. Old healed fracture of the distal fifth metatarsal. Plantar and posterior calcaneal spurs. Enthesopathic changes of the Achilles tendon. No acute fracture. No malalignment. SOFT TISSUES: Soft tissue ulceration just distal to the osteotomy margin. Vascular calcifications. IMPRESSION: 1. Status post transmetatarsal amputation of the first digit and amputation of  the third digit with soft tissue ulceration just distal to the osteotomy margin. Electronically signed by: Katheleen Faes MD 05/30/2024 02:49 PM EST RP Workstation: [id number]   _______________________________________________________________________________________________________ Latest  Blood pressure (!) 133/53, pulse (!) 101, temperature 98 F (36.7 C), temperature source Oral, resp. rate 18, SpO2 100%.   Vitals  labs and radiology finding personally reviewed  Review of Systems:    Pertinent positives include:  left foot pain  Constitutional:  No weight loss, night sweats, Fevers, chills, fatigue, weight loss  HEENT:  No headaches, Difficulty swallowing,Tooth/dental problems,Sore throat,  No sneezing, itching, ear ache, nasal congestion, post nasal drip,  Cardio-vascular:  No chest pain, Orthopnea, PND, anasarca, dizziness, palpitations.no Bilateral lower extremity swelling  GI:  No heartburn, indigestion, abdominal pain, nausea, vomiting, diarrhea, change in bowel habits, loss of appetite, melena, blood in stool, hematemesis Resp:  no shortness of breath at rest. No dyspnea on exertion, No excess mucus, no productive cough, No non-productive cough, No coughing up of blood.No change in color of mucus.No wheezing. Skin:  no rash or lesions. No jaundice GU:  no dysuria, change in color of urine, no urgency or frequency. No straining to urinate.  No flank pain.   Musculoskeletal:  No joint pain or no joint swelling. No decreased range of motion. No back pain.  Psych:  No change in mood or affect. No depression or anxiety. No memory loss.  Neuro: no localizing neurological complaints, no tingling, no weakness, no double vision, no gait abnormality, no slurred speech, no confusion  All systems reviewed and apart from HOPI all are negative _______________________________________________________________________________________________ Past Medical History:   Past Medical History:  Diagnosis Date   Diabetes mellitus    History of blood transfusion    HTN (hypertension), benign 02/13/2012   Hypercholesterolemia    Hyperlipidemia    Hypertension    nodular lymphocyte predominate hodgkins lymphoma dx'd 10/2010   xrt comp 02/20/11   Peripheral vascular disease       Past Surgical History:  Procedure Laterality Date   ABDOMINAL AORTOGRAM W/LOWER EXTREMITY N/A 10/18/2021   Procedure: ABDOMINAL AORTOGRAM W/LOWER EXTREMITY;  Surgeon: Magda Debby SAILOR, MD;  Location: [facility];  Service: Cardiovascular;  Laterality: N/A;   ABDOMINAL AORTOGRAM W/LOWER EXTREMITY N/A 09/30/2022   Procedure: ABDOMINAL AORTOGRAM W/LOWER EXTREMITY;  Surgeon: Serene Gaile ORN, MD;  Location: [facility];  Service: Cardiovascular;  Laterality: N/A;   ABDOMINAL AORTOGRAM W/LOWER EXTREMITY N/A 06/09/2023   Procedure: ABDOMINAL AORTOGRAM W/LOWER EXTREMITY;  Surgeon: Serene Gaile ORN, MD;  Location: [facility];  Service: Cardiovascular;  Laterality: N/A;   ABDOMINAL AORTOGRAM W/LOWER EXTREMITY N/A 03/11/2024   Procedure: ABDOMINAL AORTOGRAM W/LOWER EXTREMITY;  Surgeon: Magda Debby SAILOR, MD;  Location: [facility];  Service: Cardiovascular;  Laterality: N/A;   AMPUTATION Left 03/25/2024   Procedure: Partial ray amputation, left great toe;  Surgeon: Malvin Marsa FALCON, DPM;  Location: [facility];  Service: Orthopedics/Podiatry;  Laterality: Left;  Left  great toe amputation   AMPUTATION TOE Left 11/01/2021   Procedure: AMPUTATION TOE;  Surgeon: Silva Juliene SAUNDERS, DPM;  Location: [facility];  Service: Podiatry;  Laterality: Left;  will do own block   APPLICATION OF WOUND VAC Left 10/30/2021   Procedure: APPLICATION OF WOUND VAC;  Surgeon: Serene Gaile ORN, MD;  Location: [facility];  Service: Vascular;  Laterality: Left;   BYPASS GRAFT FEMORAL-PERONEAL Left 03/23/2024   Procedure: CREATION, BYPASS, ARTERIAL, COMMON FEMORAL TO PERONEAL, USING GORTEX GRAFT;  Surgeon: Magda Debby SAILOR, MD;  Location: [facility] OR;  Service: Vascular;  Laterality: Left;   BYPASS GRAFT POPLITEAL TO TIBIAL Left 10/30/2021   Procedure: LEFT FEMORAL TO PERONEAL BYPASS GRAFT USING VEIN;  Surgeon: Serene Gaile ORN, MD;  Location: [facility];  Service: Vascular;  Laterality: Left;   CATARACT EXTRACTION Bilateral    CESAREAN SECTION     2   COLONOSCOPY     DILATION AND CURETTAGE OF UTERUS     LOWER EXTREMITY ANGIOGRAPHY N/A 03/11/2024   Procedure: Lower Extremity Angiography;  Surgeon: Magda Debby SAILOR, MD;  Location: [facility];  Service: Cardiovascular;  Laterality: N/A;   PERIPHERAL VASCULAR ATHERECTOMY  06/09/2023   Procedure: PERIPHERAL VASCULAR ATHERECTOMY;  Surgeon: Serene Gaile ORN, MD;  Location: [facility];  Service: Cardiovascular;;   PERIPHERAL VASCULAR BALLOON ANGIOPLASTY  09/30/2022   Procedure: PERIPHERAL VASCULAR BALLOON ANGIOPLASTY;  Surgeon: Serene Gaile ORN, MD;  Location: [facility];  Service: Cardiovascular;;   PERIPHERAL VASCULAR BALLOON ANGIOPLASTY Left 06/10/2023   Procedure: PERIPHERAL VASCULAR BALLOON ANGIOPLASTY;  Surgeon: Lanis Fonda BRAVO, MD;  [facility] LAB;  Service: Cardiovascular;  Laterality: Left;   PERIPHERAL VASCULAR THROMBECTOMY  06/09/2023   Procedure: PERIPHERAL VASCULAR THROMBECTOMY;  Surgeon: Serene Gaile ORN, MD;  Location: [facility];  Service: Cardiovascular;;   PERIPHERAL VASCULAR THROMBECTOMY Left  06/10/2023   Procedure: LYSIS RECHECK;  Surgeon: Lanis Fonda BRAVO, MD;  Location: [facility]VASIVE CV LAB;  Service: Cardiovascular;  Laterality: Left;   VEIN HARVEST Left 10/30/2021   Procedure: VEIN HARVEST;  Surgeon: Serene Gaile ORN, MD;  Location: [facility] OR;  Service: Vascular;  Laterality: Left;    Social History:  Ambulatory   independently     reports that she has never smoked. She has never used smokeless tobacco. She reports current alcohol use. She reports that she does not use drugs.     Family History:   Family History  Problem Relation Age of Onset   Diabetes Father    Cancer Maternal Grandfather        lung   Cancer Paternal Grandfather        leukemia   ______________________________________________________________________________________________ Allergies: Allergies[1]   Prior to Admission medications  Medication Sig Start Date End Date Taking? Authorizing Provider  aspirin  EC 81 MG tablet Take 1 tablet (81 mg total) by mouth daily. Swallow whole. 06/10/23   Serene Gaile ORN, MD  atorvastatin  (LIPITOR) 40 MG tablet Take 1 tablet (40 mg total) by mouth at bedtime. 03/11/24 05/24/24  Cindy Garnette POUR, MD  ezetimibe  (ZETIA ) 10 MG tablet Take 10 mg by mouth daily. 09/06/21   [provider]  fluconazole  (DIFLUCAN ) 100 MG tablet Take 1 tablet (100 mg total) by mouth daily. 04/19/24   Rhyne, Samantha J, PA-C  glimepiride (AMARYL) 2 MG tablet Take 2 mg by mouth at bedtime. 09/12/21   [provider]  Glycerin-Hypromellose-PEG 400 (DRY EYE RELIEF DROPS OP) Place 1 drop into both eyes daily as needed (dry eye).    [provider]  levofloxacin  (LEVAQUIN ) 500 MG tablet Take 1 tablet (500 mg total) by mouth daily for 10 days. 05/24/24 06/03/24  Standiford, Marsa FALCON, DPM  metFORMIN  (GLUCOPHAGE -XR) 500 MG 24 hr tablet Take 500 mg by mouth 2 (two) times daily. 07/17/21   [provider]  metoprolol  tartrate (LOPRESSOR ) 25 MG tablet Take 1 tablet (25 mg  total) by mouth 2 (two) times daily. 03/28/24 05/24/24  Perri DELENA Mau  Mickey., MD  Multiple Vitamins-Minerals (MULTIVITAMIN GUMMIES ADULT PO) Take 2 tablets by mouth daily.    [provider]  mupirocin  ointment (BACTROBAN ) 2 % Apply 1 Application topically daily. Patient not taking: Reported on 05/24/2024 04/12/24   Standiford, Marsa FALCON, DPM  oxyCODONE -acetaminophen  (PERCOCET/ROXICET) 5-325 MG tablet Take 1 tablet by mouth every 6 (six) hours as needed for moderate pain (pain score 4-6). Patient not taking: Reported on 05/24/2024 03/24/24   Swayze, Ava, DO  rivaroxaban  (XARELTO ) 2.5 MG TABS tablet Take 1 tablet (2.5 mg total) by mouth 2 (two) times daily. 04/28/24 05/28/24  Serene Gaile ORN, MD    ___________________________________________________________________________________________________ Physical Exam:    05/30/2024    7:29 PM 05/30/2024   12:34 PM 05/03/2024   12:54 PM  Vitals with BMI  Height   5' 3  Weight   162 lbs  BMI   28.7  Systolic 133 161 875  Diastolic 53 71 60  Pulse 101 119 107     1. General:  in No  Acute distress    Chronically ill  -appearing 2. Psychological: Alert and   Oriented 3. Head/ENT:   Dry Mucous Membranes                          Head Non traumatic, neck supple              Poor Dentition 4. SKIN:  decreased Skin turgor,  Skin clean Dry         5. Heart: Regular rate and rhythm no  Murmur, no Rub or gallop 6. Lungs:  , no wheezes or crackles   7. Abdomen: Soft,  non-tender, Non distended bowel sounds present 8. Lower extremities: no clubbing, cyanosis, no  edema 9. Neurologically Grossly intact, moving all 4 extremities equally   10. MSK: Normal range of motion  Chest wall swelling   Chart has been reviewed  ______________________________________________________________________________________________  Assessment/Plan  74 y.o. female with medical history significant of DM2, peripheral vascular disease involving left lower  extremity status post bypass, HLD, osteomyelitis of the left great toe status post amputation by podiatry, history of Hodgkin's lymphoma   Admitted for   Wound infection     Present on Admission:  Hodgkin's disease, stage IA (HCC)  Hyperlipidemia associated with type 2 diabetes mellitus (HCC)  Hypertension associated with diabetes (HCC)  Osteomyelitis of great toe of left foot (HCC)  PAD (peripheral artery disease)  Type 2 diabetes mellitus with complication, without long-term current use of insulin  (HCC)  Ulcer of left foot (HCC)    Hodgkin's disease, stage IA (HCC) Chronic currently in remission  Hyperlipidemia associated with type 2 diabetes mellitus (HCC) Continue Lipitor 40 mg a day  Hypertension associated with diabetes (HCC) Resume metoprolol  25 mg twice daily  Osteomyelitis of great toe of left foot (HCC) Status post amputation now with complication of dehiscence unclear if there is residual infection. But dietary has been consulted. Obtain MRI of the stump Given potential wound infection order cefepime  and vancomycin  history of Pseudomonas  PAD (peripheral artery disease) Continue Lipitor 40 mg a day and aspirin   Would resume   Xarelto  when considered to be safe by podiatry postoperatively  Type 2 diabetes mellitus with complication, without long-term current use of insulin  (HCC) Order sliding scale  Ulcer of left foot (HCC)  suspicious for ulcer infection History of Pseudomonas Now cefepime  Vanco May benefit from ID consult Podiatry is on board plan for operative intervention  on Wednesday Will need to be n.p.o. Tuesday night  Infected Chest wall cyst - discussed with ER provider they will attempt to drain Continue IV abx   Other plan as per orders.  DVT prophylaxis:  SCD     Code Status:    Code Status: Prior FULL CODE   as per patient   I had personally discussed CODE STATUS with patient  ACP   none     Family Communication:   Family not at  Bedside     Diet heart healthy   Disposition Plan:        To home once workup is complete and patient is stable   Following barriers for discharge:                                                        Pain controlled with PO medications                             white count improving able to transition to PO antibiotics                                                       Will need consultants to evaluate patient prior to discharge                    Consult Orders  (From admission, onward)           Start     Ordered   05/30/24 1825  Consult to hospitalist  Paged to [facility] by Rumalda 18:35  Once       Provider:  (Not yet assigned)  Question Answer Comment  Place call to: [facility]   Reason for Consult Admit      05/30/24 1833                               Would benefit from PT/OT eval prior to DC  Ordered                  Consults called: podiatry are aware   Admission status:  ED Disposition     ED Disposition  Admit   Condition  --   Comment  Hospital Area: [facility] [[id number]]  Level of Care: Progressive [102]  Admit to Progressive based on following criteria: MULTISYSTEM THREATS such as stable sepsis, metabolic/electrolyte imbalance with or without encephalopathy that is responding to early treatment.  May admit patient to [facility] or [facility] if equivalent level of care is available:: No  Bed request comments: sirs  Diagnosis: Wound infection [[id number]]  Admitting Physician: Halah Whiteside [[id number]]  Attending Physician: Ruthel Martine [[id number]]  Certification:: I certify this patient will need inpatient services for at least 2 midnights  Expected Medical Readiness: 06/02/2024            inpatient     I Expect 2 midnight stay secondary to severity of patient's current illness need for inpatient interventions justified by the following:  hemodynamic instability despite optimal treatment (tachycardia  )  Severe  lab/radiological/exam abnormalities including:    Wound infection   and extensive comorbidities including:   DM2    CHF PAD   That are currently affecting medical management.   I expect  patient to be hospitalized for 2 midnights requiring inpatient medical care.  Patient is at high risk for adverse outcome (such as loss of life or disability) if not treated.  Indication for inpatient stay as follows:   Hemodynamic instability despite maximal medical therapy,  severe pain requiring acute inpatient management,   Need for operative/procedural  intervention    Need for IV antibiotics, IV fluids,  IV pain medications,     Level of care    progressive     Beatryce Colombo 05/30/2024, 11:14 PM    [facility]     after 2 AM please page floor coverage   If 7AM-7PM, please contact the day team taking care of the patient using [email or web address]        [1] No Known Allergies  "

## 2024-05-30 NOTE — ED Triage Notes (Signed)
 Pt had amputation of big toe on left foot October 31st and has had postop infection since. [facility] doctor sent her to ED to be evaluated. Pt says she is scheduled for another surgery this Wednesday but told her to come here? Pt is frustrated by lack of communication.  Pt states doctor said he might want an ID doctor to look at it

## 2024-05-30 NOTE — Telephone Encounter (Signed)
 Patient asked to give provider a message before her phone went out, Infectious Disease scheduled an appointment with patient on 06/09/24 at 2:00pm.

## 2024-05-30 NOTE — Assessment & Plan Note (Addendum)
 Continue Lipitor 40 mg a day and aspirin   Would resume   Xarelto  when considered to be safe by podiatry postoperatively

## 2024-05-30 NOTE — Telephone Encounter (Signed)
 Called Pt gave her your message. She asked if you could do surgery tomorrow. Told her I will notify you and have you f/u with her once she is admitted.

## 2024-05-30 NOTE — ED Provider Notes (Signed)
 " [place] EMERGENCY DEPARTMENT AT [facility] Provider Note   CSN: [id number] Arrival date & time: 05/30/24  1230     Patient presents with: Wound Infection   NAVEAH BRAVE is a 74 y.o. female.   HPI  Patient is a 74 year old diabetic, hyperlipidemia, hypertension, high cholesterol  Patient presents emergency room today with complaints of ongoing issues of wound and left foot.  Had surgery done in October 31 and has had issues with infection since.  Has been on several antibiotics most recently Levaquin .  Followed by podiatry.       Prior to Admission medications  Medication Sig Start Date End Date Taking? Authorizing Provider  aspirin  EC 81 MG tablet Take 1 tablet (81 mg total) by mouth daily. Swallow whole. 06/10/23   Serene Gaile ORN, MD  atorvastatin  (LIPITOR) 40 MG tablet Take 1 tablet (40 mg total) by mouth at bedtime. 03/11/24 05/24/24  Cindy Garnette POUR, MD  ezetimibe  (ZETIA ) 10 MG tablet Take 10 mg by mouth daily. 09/06/21   [provider]  fluconazole  (DIFLUCAN ) 100 MG tablet Take 1 tablet (100 mg total) by mouth daily. 04/19/24   Rhyne, Samantha J, PA-C  glimepiride (AMARYL) 2 MG tablet Take 2 mg by mouth at bedtime. 09/12/21   [provider]  Glycerin-Hypromellose-PEG 400 (DRY EYE RELIEF DROPS OP) Place 1 drop into both eyes daily as needed (dry eye).    [provider]  levofloxacin  (LEVAQUIN ) 500 MG tablet Take 1 tablet (500 mg total) by mouth daily for 10 days. 05/24/24 06/03/24  Standiford, Marsa FALCON, DPM  metFORMIN  (GLUCOPHAGE -XR) 500 MG 24 hr tablet Take 500 mg by mouth 2 (two) times daily. 07/17/21   [provider]  metoprolol  tartrate (LOPRESSOR ) 25 MG tablet Take 1 tablet (25 mg total) by mouth 2 (two) times daily. 03/28/24 05/24/24  Perri DELENA Meliton Mickey., MD  Multiple Vitamins-Minerals (MULTIVITAMIN GUMMIES ADULT PO) Take 2 tablets by mouth daily.    [provider]  mupirocin  ointment (BACTROBAN ) 2 %  Apply 1 Application topically daily. Patient not taking: Reported on 05/24/2024 04/12/24   Standiford, Marsa FALCON, DPM  oxyCODONE -acetaminophen  (PERCOCET/ROXICET) 5-325 MG tablet Take 1 tablet by mouth every 6 (six) hours as needed for moderate pain (pain score 4-6). Patient not taking: Reported on 05/24/2024 03/24/24   Swayze, Ava, DO  rivaroxaban  (XARELTO ) 2.5 MG TABS tablet Take 1 tablet (2.5 mg total) by mouth 2 (two) times daily. 04/28/24 05/28/24  Serene Gaile ORN, MD    Allergies: Patient has no known allergies.    Review of Systems  Updated Vital Signs BP (!) 161/71   Pulse (!) 119   Temp 98.1 F (36.7 C)   Resp 18   SpO2 100%   Physical Exam Vitals and nursing note reviewed.  Constitutional:      General: She is not in acute distress. HENT:     Head: Normocephalic and atraumatic.     Nose: Nose normal.     Mouth/Throat:     Mouth: Mucous membranes are moist.  Eyes:     General: No scleral icterus. Cardiovascular:     Rate and Rhythm: Normal rate and regular rhythm.     Pulses: Normal pulses.     Heart sounds: Normal heart sounds.  Pulmonary:     Effort: Pulmonary effort is normal. No respiratory distress.     Breath sounds: No wheezing.  Abdominal:     Palpations: Abdomen is soft.     Tenderness: There  is no abdominal tenderness.  Musculoskeletal:     Cervical back: Normal range of motion.     Right lower leg: No edema.     Left lower leg: No edema.  Skin:    General: Skin is warm and dry.     Capillary Refill: Capillary refill takes less than 2 seconds.     Comments: Patient with redness and swelling over dorsum of foot see pictures below.  Surgically absent left great toe wound with necrotic appearing black tissue   Dopplered blood flow to lateral and medial side of foot.   2 cm in diameter sebaceous cyst to left chest  Neurological:     Mental Status: She is alert. Mental status is at baseline.  Psychiatric:        Mood and Affect: Mood normal.         Behavior: Behavior normal.        (all labs ordered are listed, but only abnormal results are displayed) Labs Reviewed  CBC WITH DIFFERENTIAL/PLATELET - Abnormal; Notable for the following components:      Result Value   WBC 11.4 (*)    Neutro Abs 8.3 (*)    Abs Immature Granulocytes 0.08 (*)    All other components within normal limits  BASIC METABOLIC PANEL WITH GFR - Abnormal; Notable for the following components:   Glucose, Bld 261 (*)    All other components within normal limits  CULTURE, BLOOD (ROUTINE X 2)  CULTURE, BLOOD (ROUTINE X 2)  I-STAT CG4 LACTIC ACID, ED    EKG: None  Radiology: DG Foot Complete Left Result Date: 05/30/2024 EXAM: 3 OR MORE VIEW(S) XRAY OF THE LEFT FOOT 05/30/2024 02:11:00 PM COMPARISON: 03/25/2024 CLINICAL HISTORY: 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 Status post third digit amputation. Old healed fracture of the distal fifth metatarsal. Plantar and posterior calcaneal spurs. Enthesopathic changes of the Achilles tendon. No acute fracture. No malalignment. SOFT TISSUES: Soft tissue ulceration just distal to the osteotomy margin. Vascular calcifications. IMPRESSION: 1. Status post transmetatarsal amputation of the first digit and amputation of the third digit with soft tissue ulceration just distal to the osteotomy margin. Electronically signed by: Dayne Hassell MD 05/30/2024 02:49 PM EST RP Workstation: [id number]     .Incision and Drainage  Date/Time: 05/31/2024 12:35 AM  Performed by: Neldon Hamp RAMAN, PA Authorized by: Neldon Hamp RAMAN, PA   Consent:    Consent obtained:  Verbal   Consent given by:  Patient   Risks discussed:  Bleeding, incomplete drainage, pain and damage to other organs   Alternatives discussed:  No treatment Universal protocol:    Procedure explained and questions answered to patient or proxy's  satisfaction: yes     Relevant documents present and verified: yes     Test results available : yes     Imaging studies available: yes     Required blood products, implants, devices, and special equipment available: yes     Site/side marked: yes     Immediately prior to procedure, a time out was called: yes     Patient identity confirmed:  Verbally with patient Location:    Type:  Abscess   Size:  2   Location:  Trunk   Trunk location:  Chest Pre-procedure details:    Skin preparation:  Betadine Anesthesia:    Anesthesia method:  Local infiltration   Local anesthetic:  10 mL lidocaine -EPINEPHrine  2 %-  1:200000 Procedure type:    Complexity:  Complex Procedure details:    Incision types:  Single straight   Incision depth:  Subcutaneous   Wound management:  Probed and deloculated, irrigated with saline and extensive cleaning   Drainage:  Purulent   Drainage amount:  Moderate Post-procedure details:    Procedure completion:  Tolerated well, no immediate complications Comments:     I incised a infected sebaceous cyst in the patient's left chest I did loosely close the incision with a single 5-0 absorbable fast gut suture .Critical Care  Performed by: Neldon Hamp RAMAN, PA Authorized by: Neldon Hamp RAMAN, PA   Critical care provider statement:    Critical care time (minutes):  35   Critical care time was exclusive of:  Separately billable procedures and treating other patients and teaching time   Critical care was necessary to treat or prevent imminent or life-threatening deterioration of the following conditions: Wound infection.   Critical care was time spent personally by me on the following activities:  Development of treatment plan with patient or surrogate, review of old charts, re-evaluation of patient's condition, pulse oximetry, ordering and review of radiographic studies, ordering and review of laboratory studies, ordering and performing treatments and interventions,  obtaining history from patient or surrogate, examination of patient and evaluation of patient's response to treatment   Care discussed with: admitting provider      Medications Ordered in the ED  sodium chloride  0.9 % bolus 1,000 mL (has no administration in time range)  vancomycin  (VANCOREADY) IVPB 2000 mg/400 mL (has no administration in time range)  ceFEPIme  (MAXIPIME ) 2 g in sodium chloride  0.9 % 100 mL IVPB (has no administration in time range)  LORazepam  (ATIVAN ) tablet 1 mg (has no administration in time range)                                    Medical Decision Making Amount and/or Complexity of Data Reviewed Radiology: ordered.  Risk Prescription drug management. Decision regarding hospitalization.   This patient presents to the ED for concern of L foot wound, this involves a number of treatment options, and is a complaint that carries with it a moderate to high risk of complications and morbidity. A differential diagnosis was considered for the patient's symptoms which is discussed below:   Fracture, infection, arterial injury, DVT, osteoarthritis, gout, CPPD  Given this patient's presentation and physical exam a very high concern for infection very low suspicion for this being acute arterial injury or DVT.   Co morbidities: Discussed in HPI   Brief History:  Patient is a 74 year old diabetic, hyperlipidemia, hypertension, high cholesterol  Patient presents emergency room today with complaints of ongoing issues of wound and left foot.  Had surgery done in October 31 and has had issues with infection since.  Has been on several antibiotics most recently Levaquin .  Followed by podiatry.     EMR reviewed including pt PMHx, past surgical history and past visits to ER.   See HPI for more details   Lab Tests:  I ordered and independently interpreted labs. Labs notable for CBC with leukocytosis, no anemia, left shift present, glucose elevated to 61 MRI left foot  obtained  Imaging Studies:  X-ray obtained, MRI pending    Cardiac Monitoring:      Medicines ordered:  I ordered medication including vancomycin , cefepime , Tylenol , lidocaine  with epi, Ativan , 1 L normal saline  for antibiotic treatment of infection Reevaluation of the patient after these medicines showed that the patient improved I have reviewed the patients home medicines and have made adjustments as needed   Critical Interventions:     Consults/Attending Physician   I discussed this case with my attending physician who cosigned this note including patient's presenting symptoms, physical exam, and planned diagnostics and interventions. Attending physician stated agreement with plan or made changes to plan which were implemented.  Discussed with podiatry Dr. Malvin who recommends hospitalist admission antibiotics and podiatry will evaluate.  Hospitalist admitted patient  Reevaluation:  After the interventions noted above I re-evaluated patient and found that they have :improved   Social Determinants of Health:      Problem List / ED Course:  Patient with wound infection of left foot see pictures.  Given broad-spectrum antibiotics and admitted podiatry to follow.  Will do debridement surgery on Wednesday.  N.p.o. at midnight Tuesday.  I incised and drained infected sebaceous cyst on left chest.   Dispostion:  After consideration of the diagnostic results and the patients response to treatment, I feel that the patent would benefit from admission   Final diagnoses:  Wound infection    ED Discharge Orders     None          Neldon Hamp RAMAN, [place] 05/31/24 0039    Elnor Jayson LABOR, DO 06/06/24 0805  "

## 2024-05-30 NOTE — TOC CM/SW Note (Signed)
 TOC consult received for d/c planning needs. Follow-up to be completed with patient as appropriate.   Merilee Batty, MSN, RN Case Management [phone]

## 2024-05-30 NOTE — ED Provider Triage Note (Signed)
 Emergency Medicine Provider Triage Evaluation Note  LANYA BUCKS , a 74 y.o. female  was evaluated in triage.  Pt complains of L foot wound, known osteomyelitis with last surgery in Oct.[facility]en by [facility].  Told by Dr. Malvin to come here for surgery today. Finished courses of augmentin  and levofloxacin . Has put placed back on levofloxacin .   Last ate lunch today.   Denies fever, chills, increased pain, chest pain, shortness of breath, n/v/d, blurry vision, headache, cough, congestion.   Review of Systems  Positive: N/a Negative: N/a  Physical Exam  BP (!) 161/71   Pulse (!) 119   Temp 98.1 F (36.7 C)   Resp 18   SpO2 100%  Gen:   Awake, no distress   Resp:  Normal effort  MSK:   Moves extremities without difficulty  Other:    Medical Decision Making  Medically screening exam initiated at 1:36 PM.  Appropriate orders placed.  Neville LITTIE Bihari was informed that the remainder of the evaluation will be completed by another provider, this initial triage assessment does not replace that evaluation, and the importance of remaining in the ED until their evaluation is complete.     Beola Terrall RAMAN, [place] 05/30/24 1340

## 2024-05-30 NOTE — Telephone Encounter (Signed)
 Patient had surgery 03/25/24 and have not noticed any improvement since then. Was last seen 05/24/24 and feels that the antibiotics are not working. Continue to have redness on the top of foot at the surgical site.  At the last appointment, you mentioned possibly involving an infectious disease specialist. She has a recommendation for Dr. Sheliah Carina, located at [address] [place]. She would like to know if you are able to send a referral to this provider or provide any other suggestions prior to her next appointment on 06/07/24, ASAP. Thank you.

## 2024-05-30 NOTE — Progress Notes (Signed)
 ED Pharmacy Antibiotic Sign Off An antibiotic consult was received from an ED provider for cefepime  and vancomycin  per pharmacy dosing for wound infection. A chart review was completed to assess appropriateness.  The following one time order(s) were placed per pharmacy consult:  cefepime  2000 mg q8h vancomycin  2000 mg x 1 dose  Further antibiotic and/or antibiotic pharmacy consults should be ordered by the admitting provider if indicated.   Thank you for allowing pharmacy to be a part of this patient's care.   Dorn Buttner, PharmD, BCPS 05/30/2024 6:04 PM ED Clinical Pharmacist -  [phone]

## 2024-05-31 ENCOUNTER — Inpatient Hospital Stay (HOSPITAL_COMMUNITY)

## 2024-05-31 ENCOUNTER — Encounter: Admitting: Podiatry

## 2024-05-31 DIAGNOSIS — E1151 Type 2 diabetes mellitus with diabetic peripheral angiopathy without gangrene: Secondary | ICD-10-CM

## 2024-05-31 DIAGNOSIS — Z89412 Acquired absence of left great toe: Secondary | ICD-10-CM | POA: Diagnosis not present

## 2024-05-31 DIAGNOSIS — M86272 Subacute osteomyelitis, left ankle and foot: Secondary | ICD-10-CM

## 2024-05-31 DIAGNOSIS — L089 Local infection of the skin and subcutaneous tissue, unspecified: Secondary | ICD-10-CM

## 2024-05-31 DIAGNOSIS — Z89442 Acquired absence of left ankle: Secondary | ICD-10-CM

## 2024-05-31 LAB — CBG MONITORING, ED
Glucose-Capillary: 169 mg/dL — ABNORMAL HIGH (ref 70–99)
Glucose-Capillary: 130 mg/dL — ABNORMAL HIGH (ref 70–99)
Glucose-Capillary: 221 mg/dL — ABNORMAL HIGH (ref 70–99)

## 2024-05-31 LAB — COMPREHENSIVE METABOLIC PANEL WITH GFR
ALT: 14 U/L (ref 0–44)
AST: 22 U/L (ref 15–41)
Albumin: 3.7 g/dL (ref 3.5–5.0)
Alkaline Phosphatase: 68 U/L (ref 38–126)
Anion gap: 10 (ref 5–15)
BUN: 11 mg/dL (ref 8–23)
CO2: 23 mmol/L (ref 22–32)
Calcium: 9.2 mg/dL (ref 8.9–10.3)
Chloride: 104 mmol/L (ref 98–111)
Creatinine, Ser: 0.55 mg/dL (ref 0.44–1.00)
GFR, Estimated: >60 mL/min
Glucose, Bld: 198 mg/dL — ABNORMAL HIGH (ref 70–99)
Potassium: 3.7 mmol/L (ref 3.5–5.1)
Sodium: 137 mmol/L (ref 135–145)
Total Bilirubin: 0.3 mg/dL (ref 0.0–1.2)
Total Protein: 6.7 g/dL (ref 6.5–8.1)

## 2024-05-31 LAB — CBC
HCT: 37.7 % (ref 36.0–46.0)
Hemoglobin: 12.1 g/dL (ref 12.0–15.0)
MCH: 30 pg (ref 26.0–34.0)
MCHC: 32.1 g/dL (ref 30.0–36.0)
MCV: 93.5 fL (ref 80.0–100.0)
Platelets: 331 K/uL (ref 150–400)
RBC: 4.03 MIL/uL (ref 3.87–5.11)
RDW: 14.1 % (ref 11.5–15.5)
WBC: 10.4 K/uL (ref 4.0–10.5)
nRBC: 0 % (ref 0.0–0.2)

## 2024-05-31 LAB — HEMOGLOBIN A1C
Hgb A1c MFr Bld: 7.8 % — ABNORMAL HIGH (ref 4.8–5.6)
Mean Plasma Glucose: 177.16 mg/dL

## 2024-05-31 LAB — MAGNESIUM: Magnesium: 1.8 mg/dL (ref 1.7–2.4)

## 2024-05-31 LAB — GLUCOSE, CAPILLARY
Glucose-Capillary: 190 mg/dL — ABNORMAL HIGH (ref 70–99)
Glucose-Capillary: 231 mg/dL — ABNORMAL HIGH (ref 70–99)
Glucose-Capillary: 151 mg/dL — ABNORMAL HIGH (ref 70–99)

## 2024-05-31 LAB — SEDIMENTATION RATE: Sed Rate: 55 mm/h — ABNORMAL HIGH (ref 0–22)

## 2024-05-31 LAB — PHOSPHORUS: Phosphorus: 2.9 mg/dL (ref 2.5–4.6)

## 2024-05-31 LAB — PREALBUMIN: Prealbumin: 9 mg/dL — ABNORMAL LOW (ref 18–38)

## 2024-05-31 LAB — C-REACTIVE PROTEIN: CRP: 1.6 mg/dL — ABNORMAL HIGH

## 2024-05-31 NOTE — Progress Notes (Signed)
" ° °  Brief Progress Note   _____________________________________________________________________________________________________________  Patient Name: Katie Hawkins Patient DOB: Dec 08, 1950 Date: @TODAY @      Data: Reviewed labs, notes, VS.     Action: No action needed at this time.      Response:    _____________________________________________________________________________________________________________  [facility] RN Expeditor Sharolyn JONETTA Batman Please contact us  directly via secure chat (search for [facility]) or by calling us  at [phone] [facility]).  "

## 2024-05-31 NOTE — Consult Note (Signed)
 " Hospital Consult    Reason for Consult: Left foot wound, recent femoral to peroneal bypass with PTFE.  MRN #:  [id number]  History of Present Illness: This is a 74 y.o. female with severe PAD and a nonhealing left first ray amputation.  In the past she has had a left CFA to peroneal bypass with GSV which had multiple interventions as well as a mechanical thrombectomy.  This occluded and she underwent redo bypass with PTFE in October of this year.  She was recently seen in the office and was noted to have a widely patent bypass and an ABI of 1.1. She is now admitted with a nonhealing left first toe amputation and is planned to undergo left TMA with Dr. Malvin.  Past Medical History:  Diagnosis Date   Diabetes mellitus    History of blood transfusion    HTN (hypertension), benign 02/13/2012   Hypercholesterolemia    Hyperlipidemia    Hypertension    nodular lymphocyte predominate hodgkins lymphoma dx'd 10/2010   xrt comp 02/20/11   Peripheral vascular disease     Past Surgical History:  Procedure Laterality Date   ABDOMINAL AORTOGRAM W/LOWER EXTREMITY N/A 10/18/2021   Procedure: ABDOMINAL AORTOGRAM W/LOWER EXTREMITY;  Surgeon: Magda Debby SAILOR, MD;  [facility]: [facility];  Service: Cardiovascular;  Laterality: N/A;   ABDOMINAL AORTOGRAM W/LOWER EXTREMITY N/A 09/30/2022   Procedure: ABDOMINAL AORTOGRAM W/LOWER EXTREMITY;  Surgeon: Serene Gaile ORN, MD;  Location: [facility];  Service: Cardiovascular;  Laterality: N/A;   ABDOMINAL AORTOGRAM W/LOWER EXTREMITY N/A 06/09/2023   Procedure: ABDOMINAL AORTOGRAM W/LOWER EXTREMITY;  Surgeon: Serene Gaile ORN, MD;  [facility]: [facility];  Service: Cardiovascular;  Laterality: N/A;   ABDOMINAL AORTOGRAM W/LOWER EXTREMITY N/A 03/11/2024   Procedure: ABDOMINAL AORTOGRAM W/LOWER EXTREMITY;  Surgeon: Magda Debby SAILOR, MD;  [facility]: [facility];  Service: Cardiovascular;  Laterality: N/A;   AMPUTATION Left 03/25/2024    Procedure: Partial ray amputation, left great toe;  Surgeon: Malvin Marsa FALCON, DPM;  Location: [facility];  Service: Orthopedics/Podiatry;  Laterality: Left;  Left great toe amputation   AMPUTATION TOE Left 11/01/2021   Procedure: AMPUTATION TOE;  Surgeon: Silva Juliene SAUNDERS, DPM;  Location: [facility];  Service: Podiatry;  Laterality: Left;  will do own block   APPLICATION OF WOUND VAC Left 10/30/2021   Procedure: APPLICATION OF WOUND VAC;  Surgeon: Serene Gaile ORN, MD;  Location: [facility];  Service: Vascular;  Laterality: Left;   BYPASS GRAFT FEMORAL-PERONEAL Left 03/23/2024   Procedure: CREATION, BYPASS, ARTERIAL, COMMON FEMORAL TO PERONEAL, USING GORTEX GRAFT;  Surgeon: Magda Debby SAILOR, MD;  [facility]: [facility];  Service: Vascular;  Laterality: Left;   BYPASS GRAFT POPLITEAL TO TIBIAL Left 10/30/2021   Procedure: LEFT FEMORAL TO PERONEAL BYPASS GRAFT USING VEIN;  Surgeon: Serene Gaile ORN, MD;  [facility];  Service: Vascular;  Laterality: Left;   CATARACT EXTRACTION Bilateral    CESAREAN SECTION     2   COLONOSCOPY     DILATION AND CURETTAGE OF UTERUS     LOWER EXTREMITY ANGIOGRAPHY N/A 03/11/2024   Procedure: Lower Extremity Angiography;  Surgeon: Magda Debby SAILOR, MD;  [facility];  Service: Cardiovascular;  Laterality: N/A;   PERIPHERAL VASCULAR ATHERECTOMY  06/09/2023   Procedure: PERIPHERAL VASCULAR ATHERECTOMY;  Surgeon: Serene Gaile ORN, MD;  [facility];  Service: Cardiovascular;;   PERIPHERAL VASCULAR BALLOON ANGIOPLASTY  09/30/2022   Procedure: PERIPHERAL VASCULAR BALLOON  ANGIOPLASTY;  Surgeon: Serene Gaile ORN, MD;  [facility];  Service: Cardiovascular;;   PERIPHERAL VASCULAR BALLOON ANGIOPLASTY Left 06/10/2023   Procedure: PERIPHERAL VASCULAR BALLOON ANGIOPLASTY;  Surgeon: Lanis Fonda BRAVO, MD;  Location: [facility];  Service: Cardiovascular;  Laterality: Left;   PERIPHERAL VASCULAR THROMBECTOMY  06/09/2023   Procedure:  PERIPHERAL VASCULAR THROMBECTOMY;  Surgeon: Serene Gaile ORN, MD;  [facility];  Service: Cardiovascular;;   PERIPHERAL VASCULAR THROMBECTOMY Left 06/10/2023   Procedure: LYSIS RECHECK;  Surgeon: Lanis Fonda BRAVO, MD;  Location: [facility];  Service: Cardiovascular;  Laterality: Left;   VEIN HARVEST Left 10/30/2021   Procedure: VEIN HARVEST;  Surgeon: Serene Gaile ORN, MD;  Location: [facility] OR;  Service: Vascular;  Laterality: Left;    Allergies[1]  Prior to Admission medications  Medication Sig Start Date End Date Taking? Authorizing Provider  aspirin  EC 81 MG tablet Take 1 tablet (81 mg total) by mouth daily. Swallow whole. 06/10/23  Yes Serene Gaile ORN, MD  atorvastatin  (LIPITOR) 40 MG tablet Take 1 tablet (40 mg total) by mouth at bedtime. 03/11/24 05/30/24 Yes Cindy Garnette POUR, MD  ezetimibe  (ZETIA ) 10 MG tablet Take 10 mg by mouth daily. 09/06/21  Yes [provider]  glimepiride (AMARYL) 2 MG tablet Take 2 mg by mouth at bedtime. 09/12/21  Yes [provider]  Glycerin-Hypromellose-PEG 400 (DRY EYE RELIEF DROPS OP) Place 1 drop into both eyes daily as needed (dry eye).   Yes [provider]  levofloxacin  (LEVAQUIN ) 500 MG tablet Take 1 tablet (500 mg total) by mouth daily for 10 days. 05/24/24 06/03/24 Yes Standiford, Marsa FALCON, DPM  metFORMIN  (GLUCOPHAGE -XR) 500 MG 24 hr tablet Take 500 mg by mouth daily with breakfast. 07/17/21  Yes [provider]  metoprolol  tartrate (LOPRESSOR ) 25 MG tablet Take 1 tablet (25 mg total) by mouth 2 (two) times daily. 03/28/24 05/30/24 Yes Perri DELENA Meliton Mickey., MD  Multiple Vitamins-Minerals (MULTIVITAMIN GUMMIES ADULT PO) Take 2 tablets by mouth daily.   Yes [provider]  oxyCODONE -acetaminophen  (PERCOCET/ROXICET) 5-325 MG tablet Take 1 tablet by mouth every 6 (six) hours as needed for moderate pain (pain score 4-6). 03/24/24  Yes Swayze, Ava, DO  rivaroxaban  (XARELTO ) 2.5 MG TABS tablet Take  1 tablet (2.5 mg total) by mouth 2 (two) times daily. 04/28/24 05/30/24 Yes Serene Gaile ORN, MD  fluconazole  (DIFLUCAN ) 100 MG tablet Take 1 tablet (100 mg total) by mouth daily. Patient not taking: Reported on 05/30/2024 04/19/24   Rhyne, Samantha J, PA-C  mupirocin  ointment (BACTROBAN ) 2 % Apply 1 Application topically daily. Patient not taking: Reported on 05/10/2024 04/12/24   Standiford, Marsa FALCON, DPM    Social History   Socioeconomic History   Marital status: Married    Spouse name: Not on file   Number of children: Not on file   Years of education: Not on file   Highest education level: Not on file  Occupational History   Not on file  Tobacco Use   Smoking status: Never   Smokeless tobacco: Never  Vaping Use   Vaping status: Never Used  Substance and Sexual Activity   Alcohol use: Yes    Comment: very rarely   Drug use: No   Sexual activity: Not on file  Other Topics Concern   Not on file  Social History Narrative   Not on file   Social Drivers of Health   Tobacco Use: Low Risk (05/30/2024)  Patient History    Smoking Tobacco Use: Never    Smokeless Tobacco Use: Never    Passive Exposure: Not on file  Financial Resource Strain: Not on file  Food Insecurity: No Food Insecurity (05/31/2024)   Epic    Worried About Programme Researcher, Broadcasting/film/video in the Last Year: Never true    Ran Out of Food in the Last Year: Never true  Transportation Needs: No Transportation Needs (05/31/2024)   Epic    Lack of Transportation (Medical): No    Lack of Transportation (Non-Medical): No  Physical Activity: Not on file  Stress: Not on file  Social Connections: Socially Integrated (05/31/2024)   Social Connection and Isolation Panel    Frequency of Communication with Friends and Family: Three times a week    Frequency of Social Gatherings with Friends and Family: More than three times a week    Attends Religious Services: More than 4 times per year    Active Member of [employer] or Organizations: Yes     Attends Banker Meetings: More than 4 times per year    Marital Status: Married  Catering Manager Violence: Not At Risk (05/31/2024)   Epic    Fear of Current or Ex-Partner: No    Emotionally Abused: No    Physically Abused: No    Sexually Abused: No  Depression (PHQ2-9): Not on file  Alcohol Screen: Not on file  Housing: Low Risk (05/31/2024)   Epic    Unable to Pay for Housing in the Last Year: No    Number of Times Moved in the Last Year: 0    Homeless in the Last Year: No  Utilities: Not At Risk (05/31/2024)   Epic    Threatened with loss of utilities: No  Health Literacy: Not on file    Family History  Problem Relation Age of Onset   Diabetes Father    Cancer Maternal Grandfather        lung   Cancer Paternal Grandfather        leukemia    ROS: Otherwise negative unless mentioned in HPI  Physical Examination  Vitals:   05/31/24 0825 05/31/24 1203  BP: (!) 140/62 (!) 143/82  Pulse: 100 (!) 146  Resp: 18 18  Temp:  98.6 F (37 C)  SpO2: 97% 100%   There is no height or weight on file to calculate BMI.  General: no acute distress Cardiac: hemodynamically stable Neuro: alert, no focal deficit Extremities: Left first toe amp Tatian site nonhealing, some serous drainage present. Vascular:   Left: Biphasic left PT, DP and peroneal   Data:   ABI ABI Findings:  +---------+------------------+-----+----------+--------+  Right   Rt Pressure (mmHg)IndexWaveform  Comment   +---------+------------------+-----+----------+--------+  Brachial 156                                        +---------+------------------+-----+----------+--------+  ATA     113               0.72 monophasic          +---------+------------------+-----+----------+--------+  PTA     110               0.71 monophasic          +---------+------------------+-----+----------+--------+  Great Toe34                0.22                      +---------+------------------+-----+----------+--------+   +---------+------------------+-----+-----------+---------+  Left    Lt Pressure (mmHg)IndexWaveform   Comment    +---------+------------------+-----+-----------+---------+  Brachial 147                                          +---------+------------------+-----+-----------+---------+  ATA     175               1.12 multiphasic           +---------+------------------+-----+-----------+---------+  PTA     182               1.17 multiphasic           +---------+------------------+-----+-----------+---------+  Great Toe                                  amputated  +---------+------------------+-----+-----------+---------+   Duplex Left Graft #1: CFA - Peroneal artery  +--------------------+--------+--------+---------+--------+                     PSV cm/sStenosisWaveform Comments  +--------------------+--------+--------+---------+--------+  Inflow             117             triphasic          +--------------------+--------+--------+---------+--------+  Proximal Anastomosis80              biphasic           +--------------------+--------+--------+---------+--------+  Proximal Graft      86              biphasic           +--------------------+--------+--------+---------+--------+  Mid Graft           78              biphasic           +--------------------+--------+--------+---------+--------+  Distal Graft        46              biphasic           +--------------------+--------+--------+---------+--------+  Distal Anastomosis  124             biphasic           +--------------------+--------+--------+---------+--------+  Outflow            63              biphasic           +--------------------+--------+--------+---------+--------+     ASSESSMENT/PLAN: This is a 74 y.o. female with severe PAD who recently underwent a redo femoral to peroneal  artery bypass with PTFE on the left and has a nonhealing left first toe amputation site.  Plan to undergo TMA.  We are consulted to evaluate her perfusion.  At this point she is maximally revascularized and there is unlikely any thing to improve.  This is her second femoral to peroneal artery bypass and vascular labs from a month ago demonstrate a widely patent bypass with an ABI of 1.1. I explained to her that even with a TMA she may not heal and could require an amputation higher up.  I do think it is reasonable to try a TMA given that her bypass was just performed about 3 months ago and we will just have to see how she heals but at this time her perfusion is  as best as we can get it.   Norman GORMAN Serve MD [facility] [phone] 05/31/2024  2:35 PM     [1] No Known Allergies  "

## 2024-05-31 NOTE — Progress Notes (Signed)
 "  PROGRESS NOTE    Katie Hawkins  FMW:[id number] DOB: June 13, 1950 DOA: 05/30/2024 PCP: Claudene Pellet, MD   Brief Narrative: Katie Hawkins is a 74 y.o. female with a history of PAD, diabetes mellitus, hodgkin lymphoma, hyperlipidemia, hyperlipidemia, hypertension, osteomyelitis.  Patient presented secondary to wound infection of her left foot stump with initial concern for osteomyelitis. Empiric antibiotics obtained and MRI confirms evidence of osteomyelitis. Podiatry plan for a transmetatarsal amputation. Infectious disease and vascular surgery also consulted.   Assessment/Plan:  Left foot cellulitis Left foot ulcer Left foot osteomyelitis Patient follows with vascular surgery and podiatry as an outpatient. She is s/p left first ray amputation by podiatry and has a history of multiple bypass surgeries. Blood cultures obtained. Patient started empirically on vancomycin  and cefepime  IV. MRI confirms evidence of osteomyelitis involving the first metatarsal shaft transsection margin in addition to possibly the second metatarsal head. Podiatry and ID consulted. - Podiatry recommendations: left foot TMA, vascular surgery consultation - Continue vancomycin  and cefepime  - ID recommendations pending  Infected chest wall cyst Drained while in the emergency department; documented purulent fluid evacuated. No cultures obtained.  PAD Patient has significant vascular history. She follows with vascular surgery. Multiple bypass surgeries. - Continue aspirin   Hyperlipidemia - Continue Zetia  and Lipitor  Diabetes mellitus type 2 Poorly controlled based on hemoglobin A1C of 7.6%. prior to arrival medication(s) includes metformin . - Continue SSI  Primary hypertension - Continue metoprolol   History of hodgkin lymphoma Currently in remission.   DVT prophylaxis: SCDs Code Status:   Code Status: Full Code Family Communication: None at bedside Disposition Plan: Discharge pending ongoing  specialist recommendations   Consultants:  Podiatry Infectious disease Vascular surgery  Procedures:  None  Antimicrobials: Vancomycin  Cefepime     Subjective: Patient reports no significant pain of her foot. She is hoping she won't need an amputation and expects to have her wound and bone cleaned.  Objective: BP (!) 106/50   Pulse 90   Temp 98.2 F (36.8 C) (Oral)   Resp 14   SpO2 99%   Examination:  General exam: Appears calm and comfortable. Respiratory system: Clear to auscultation. Respiratory effort normal. Cardiovascular system: S1 & S2 heard, RRR.  Gastrointestinal system: Abdomen is nondistended, soft and nontender. Normal bowel sounds heard. Central nervous system: Alert and oriented. No focal neurological deficits. Musculoskeletal: No edema. No calf tenderness. Left foot first digit stump with an open necrotic wound without active purulent drainage. Surrounding erythema of the wound. Psychiatry: Judgement and insight appear normal. Mood & affect appropriate.    Data Reviewed: I have personally reviewed following labs and imaging studies   Last CBC Lab Results  Component Value Date   WBC 10.4 05/31/2024   HGB 12.1 05/31/2024   HCT 37.7 05/31/2024   MCV 93.5 05/31/2024   MCH 30.0 05/31/2024   RDW 14.1 05/31/2024   PLT 331 05/31/2024     Last metabolic panel Lab Results  Component Value Date   GLUCOSE 198 (H) 05/31/2024   NA 137 05/31/2024   K 3.7 05/31/2024   CL 104 05/31/2024   CO2 23 05/31/2024   BUN 11 05/31/2024   CREATININE 0.55 05/31/2024   GFRNONAA >60 05/31/2024   CALCIUM  9.2 05/31/2024   PHOS 2.9 05/31/2024   PROT 6.7 05/31/2024   ALBUMIN 3.7 05/31/2024   BILITOT 0.3 05/31/2024   ALKPHOS 68 05/31/2024   AST 22 05/31/2024   ALT 14 05/31/2024   ANIONGAP 10 05/31/2024     Creatinine Clearance:  CrCl cannot be calculated (Unknown ideal weight.).  No results found for this or any previous visit (from the past 240 hours).     Radiology Studies: DG Foot Complete Left Result Date: 05/30/2024 EXAM: 3 OR MORE VIEW(S) XRAY OF THE LEFT FOOT 05/30/2024 02:11:00 PM COMPARISON: 03/25/2024 CLINICAL HISTORY: 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 Status post third digit amputation. Old healed fracture of the distal fifth metatarsal. Plantar and posterior calcaneal spurs. Enthesopathic changes of the Achilles tendon. No acute fracture. No malalignment. SOFT TISSUES: Soft tissue ulceration just distal to the osteotomy margin. Vascular calcifications. IMPRESSION: 1. Status post transmetatarsal amputation of the first digit and amputation of the third digit with soft tissue ulceration just distal to the osteotomy margin. Electronically signed by: Dayne Hassell MD 05/30/2024 02:49 PM EST RP Workstation: [id number]      LOS: 1 day    Elgin Lam, MD [facility] 05/31/2024, 7:23 AM   If 7PM-7AM, please contact night-coverage [email or web address]  "

## 2024-05-31 NOTE — Consult Note (Signed)
 "        [facility]    Date of Admission:  05/30/2024     Reason for Consult: diabetic foot infection     Referring Provider: Briana     Lines:  Peripheral iv's  Abx: 1/5-c vanc 1/5-c cefepime         Assessment: 74 yo female pvd, sp prior bypass lle,  dm2, nonsmoker, recent 03/25/24 left first ray amputation, on short course abx subsequently colonized with pseudomonas placed on almost quinolone without improvement, admitted for further management  Imaging and exam/hx suggestive of persistent/new first and 2nd residual mt osteomyelitis  Pseudomonas ?bystander or pathogenic difficult to say.  Agree that this could be due to vascular insufficency wound as well  Given prolonged abx would ask to have generous margin with TMA for source control  Given cellulitis change reasonable to keep on vanc/cefepime   10/31 office swab cx kleb oxytoca, e faecalis, pseudomonas aeruginosa 1/5 admission bcx negative   Plan: Await vascular study Await tma Please send tissue cx and pathology for margin determination Continue current abx Maintain standard isolation precaution If tma finding suggest gross clear margin can finish 5 more days levo/augmentin  for soft tissue mop-up therapy post surgery        ------------------------------------------------ Active Problems:   Type 2 diabetes mellitus with complication, without long-term current use of insulin  (HCC)   Hyperlipidemia associated with type 2 diabetes mellitus (HCC)   Hodgkin's disease, stage IA (HCC)   Hypertension associated with diabetes (HCC)   PAD (peripheral artery disease)   Ulcer of left foot (HCC)   Osteomyelitis of great toe of left foot (HCC)   Subacute osteomyelitis of left foot (HCC)    HPI: Katie Hawkins is a 74 y.o. female dm2, nonsmoker, recent 03/25/24 left first ray amputation, on short course abx subsequently colonized with pseudomonas placed on almost quinolone without  improvement, admitted for further management  She has had minimal pain in the area The wound hasn't healed and she never had purulence There was office swab of the dehiscence and that grew pseudomonas  After 10/31 first ray amputation given 7 day doxycycline   No improvement on levofloxacin  2 courses already  No fever, chill, malaise  Outpatient podiatry follow up and recommended further vascular w/u along with more definitive surgery   She was not previously seen by id service     Family History  Problem Relation Age of Onset   Diabetes Father    Cancer Maternal Grandfather        lung   Cancer Paternal Grandfather        leukemia    Social History[1]  Allergies[2]  Review of Systems: ROS All Other ROS was negative, except mentioned above   Past Medical History:  Diagnosis Date   Diabetes mellitus    History of blood transfusion    HTN (hypertension), benign 02/13/2012   Hypercholesterolemia    Hyperlipidemia    Hypertension    nodular lymphocyte predominate hodgkins lymphoma dx'd 10/2010   xrt comp 02/20/11   Peripheral vascular disease        Scheduled Meds:  aspirin  EC  81 mg Oral Daily   atorvastatin   40 mg Oral QHS   ezetimibe   10 mg Oral Daily   insulin  aspart  0-9 Units Subcutaneous Q4H   metoprolol  tartrate  25 mg Oral BID   Continuous Infusions:  ceFEPime  (MAXIPIME ) IV Stopped (05/31/24 1040)   vancomycin      PRN Meds:.acetaminophen  **OR**  acetaminophen , fentaNYL  (SUBLIMAZE ) injection, HYDROcodone -acetaminophen , ondansetron  **OR** ondansetron  (ZOFRAN ) IV   OBJECTIVE: Blood pressure 104/67, pulse 90, temperature 98.1 F (36.7 C), temperature source Oral, resp. rate 14, SpO2 97%.  Physical Exam General/constitutional: no distress, pleasant HEENT: Normocephalic, PER, Conj Clear, EOMI, Oropharynx clear Neck supple CV: rrr no mrg Lungs: clear to auscultation, normal respiratory effort Abd: Soft, Nontender Ext: no edema Skin: No  Rash Neuro: nonfocal MSK: left foot open/necrotic wound medially; s/p first and 2nd partial ray; no purulence; minimal cellulitis change    Lab Results Lab Results  Component Value Date   WBC 10.4 05/31/2024   HGB 12.1 05/31/2024   HCT 37.7 05/31/2024   MCV 93.5 05/31/2024   PLT 331 05/31/2024    Lab Results  Component Value Date   CREATININE 0.55 05/31/2024   BUN 11 05/31/2024   NA 137 05/31/2024   K 3.7 05/31/2024   CL 104 05/31/2024   CO2 23 05/31/2024    Lab Results  Component Value Date   ALT 14 05/31/2024   AST 22 05/31/2024   ALKPHOS 68 05/31/2024   BILITOT 0.3 05/31/2024      Microbiology: Recent Results (from the past 240 hours)  Blood culture (routine x 2)     Status: None (Preliminary result)   Collection Time: 05/30/24  5:54 PM   Specimen: BLOOD  Result Value Ref Range Status   Specimen Description BLOOD SITE NOT SPECIFIED  Final   Special Requests   Final    BOTTLES DRAWN AEROBIC AND ANAEROBIC Blood Culture results may not be optimal due to an inadequate volume of blood received in culture bottles   Culture   Final    NO GROWTH < 12 HOURS Performed at [facility], [address], [place], [place] [address]    Report Status PENDING  Incomplete  Blood culture (routine x 2)     Status: None (Preliminary result)   Collection Time: 05/30/24  6:18 PM   Specimen: BLOOD  Result Value Ref Range Status   Specimen Description BLOOD SITE NOT SPECIFIED  Final   Special Requests   Final    BOTTLES DRAWN AEROBIC AND ANAEROBIC Blood Culture adequate volume   Culture   Final    NO GROWTH < 12 HOURS Performed at [facility], [address] [place], [place] [address]    Report Status PENDING  Incomplete     Serology:    Imaging: If present, new imagings (plain films, ct scans, and mri) have been personally visualized and interpreted; radiology reports have been reviewed. Decision making incorporated into the Impression /  Recommendations.  1/5 mr left foot 1. Status post partial first ray amputation with wound at the amputation stump extending deep to the first metatarsal transsection margin. Cortical indistinctness of the first metatarsal shaft transsection margin with marrow signal abnormality is compatible with osteomyelitis. 2. Marrow edema of the second metatarsal head with suggestion of subtle corresponding T1 hypointensity is suspicious for osteomyelitis. 3. No loculated fluid collection. 4. Circumferential subcutaneous edema of the midfoot extending through the toes.  Constance ONEIDA Passer, MD [facility] [phone] pager    05/31/2024, 5:31 PM     [1]  Social History Tobacco Use   Smoking status: Never   Smokeless tobacco: Never  Vaping Use   Vaping status: Never Used  Substance Use Topics   Alcohol use: Yes    Comment: very rarely   Drug use: No  [  2] No Known Allergies  "

## 2024-05-31 NOTE — Evaluation (Signed)
 Physical Therapy Evaluation Patient Details Name: Katie Hawkins MRN: [id number] DOB: [birth year] Today's Date: 05/31/2024  History of Present Illness  74 yo F presented 05/30/24 with wound dehiscence/infection L foot. Plan for transmet amputation 06/01/24.  PMH DM2, PVD w/ LUE byspass, HLD, osteomyelitis L great toe s/p amputation,  Hodgkins lymphoma  Clinical Impression  Pt admitted secondary to problem above with deficits below. PTA patient was ambulating with post-op shoe on LLE and no device. Pt educated in LLE NWB status for transfers and gait with RW and min assist. Pt with poor use of UEs to assist with hopping and fatigued after 30 ft. Discussed unlikely will be able to hop up stairs to enter home and will likely need wheelchair and 2 people to bump up the stairs. Pt reports her son and husband can do that if needed. Anticipate patient will benefit from PT to address problems listed below. Will continue to follow acutely to maximize functional mobility, independence, and safety.  Recommend HHPT on discharge for home safety evaluation and ?stair training.          If plan is discharge home, recommend the following: A little help with walking and/or transfers;Assist for transportation;Help with stairs or ramp for entrance   Can travel by private vehicle        Equipment Recommendations Wheelchair (measurements PT);Wheelchair cushion (measurements PT)  Recommendations for Other Services  OT consult    Functional Status Assessment Patient has had a recent decline in their functional status and demonstrates the ability to make significant improvements in function in a reasonable and predictable amount of time.     Precautions / Restrictions Precautions Precautions: Fall Recall of Precautions/Restrictions: Intact Restrictions Weight Bearing Restrictions Per Provider Order: No Other Position/Activity Restrictions: per podiatry note, will be NWB after surgery      Mobility   Bed Mobility Overal bed mobility: Modified Independent             General bed mobility comments: HOB elevated and rail    Transfers Overall transfer level: Needs assistance Equipment used: Rolling walker (2 wheels) Transfers: Sit to/from Stand Sit to Stand: Min assist           General transfer comment: educated in proper hand placement; pt attempting to do NWB LLE and ended up TDWB LLE during transfer    Ambulation/Gait Ambulation/Gait assistance: Min assist Gait Distance (Feet): 30 Feet Assistive device: Rolling walker (2 wheels) Gait Pattern/deviations: Step-to pattern   Gait velocity interpretation: <1.31 ft/sec, indicative of household ambulator   General Gait Details: instructed in NWB LLE; poor use of UEs on RW to assist with 'hopping; slightly improved with cues and then reverts to using poorly  Stairs Stairs:  (discussed may need to have wheelchair to go up/down exterior steps)          Wheelchair Mobility     Tilt Bed    Modified Rankin (Stroke Patients Only)       Balance Overall balance assessment: Needs assistance Sitting-balance support: No upper extremity supported, Feet unsupported Sitting balance-Leahy Scale: Normal Sitting balance - Comments: donned Rt shoe and Lt sock without imbalance   Standing balance support: Bilateral upper extremity supported, Reliant on assistive device for balance Standing balance-Leahy Scale: Poor Standing balance comment: NWB LLE                             Pertinent Vitals/Pain Pain Assessment Pain Assessment: No/denies pain  Home Living Family/patient expects to be discharged to:: Private residence Living Arrangements: Spouse/significant other Available Help at Discharge: Family;Available 24 hours/day Type of Home: House Home Access: Stairs to enter Entrance Stairs-Rails: Right Entrance Stairs-Number of Steps: 3   Home Layout: One level Home Equipment: Pharmacist, Hospital  (2 wheels);Grab bars - tub/shower;Hand held shower head      Prior Function Prior Level of Function : Independent/Modified Independent             Mobility Comments: no device PTA; denies falls in past 6 mos ADLs Comments: husband does the dressing changes; husband does the driving now     Extremity/Trunk Assessment   Upper Extremity Assessment Upper Extremity Assessment: Defer to OT evaluation    Lower Extremity Assessment Lower Extremity Assessment: LLE deficits/detail;Generalized weakness LLE Deficits / Details: open wound s/p 1st ray amputation (no dressing at this time); pt instructed in NWB in prep for post-op    Cervical / Trunk Assessment Cervical / Trunk Assessment: Normal  Communication   Communication Communication: No apparent difficulties    Cognition Arousal: Alert Behavior During Therapy: WFL for tasks assessed/performed   PT - Cognitive impairments: No apparent impairments                         Following commands: Intact       Cueing Cueing Techniques: Verbal cues     General Comments      Exercises     Assessment/Plan    PT Assessment Patient needs continued PT services  PT Problem List Decreased strength;Decreased activity tolerance;Decreased balance;Decreased mobility;Decreased knowledge of use of DME;Decreased skin integrity       PT Treatment Interventions DME instruction;Gait training;Stair training;Functional mobility training;Therapeutic activities;Therapeutic exercise;Patient/family education;Wheelchair mobility training    PT Goals (Current goals can be found in the Care Plan section)  Acute Rehab PT Goals Patient Stated Goal: go home ASAP after surgery PT Goal Formulation: With patient Time For Goal Achievement: 06/14/24 Potential to Achieve Goals: Good    Frequency Min 3X/week     Co-evaluation               AM-PAC PT 6 Clicks Mobility  Outcome Measure Help needed turning from your back to your  side while in a flat bed without using bedrails?: None Help needed moving from lying on your back to sitting on the side of a flat bed without using bedrails?: None Help needed moving to and from a bed to a chair (including a wheelchair)?: A Little Help needed standing up from a chair using your arms (e.g., wheelchair or bedside chair)?: A Little Help needed to walk in hospital room?: A Little Help needed climbing 3-5 steps with a railing? : Total 6 Click Score: 18    End of Session Equipment Utilized During Treatment: Gait belt Activity Tolerance: Patient limited by fatigue Patient left: in bed;with call bell/phone within reach;with bed alarm set   PT Visit Diagnosis: Difficulty in walking, not elsewhere classified (R26.2);Muscle weakness (generalized) (M62.81)    Time: 8553-8496 PT Time Calculation (min) (ACUTE ONLY): 17 min   Charges:   PT Evaluation $PT Eval Low Complexity: 1 Low   PT General Charges $$ ACUTE PT VISIT: 1 Visit          Macario RAMAN, PT Acute Rehabilitation Services  Office [phone]   Macario SHAUNNA Soja 05/31/2024, 3:49 PM

## 2024-05-31 NOTE — Consult Note (Signed)
 "  PODIATRY CONSULTATION  NAME Katie Hawkins MRN [id number] DOB [birth year] DOA 05/30/2024   Reason for consult:  Chief Complaint  Patient presents with   Wound Infection    Attending/Consulting physician: FABIENE Lam MD  History of present illness: 74 y.o. female with medical history significant of DM2, peripheral vascular disease involving left lower extremity status post bypass, HLD, osteomyelitis of the left great toe status post amputation by podiatry, history of Hodgkin's lymphoma.   Known from clinic, concern for dehiscence of surgical site and I was expecting need for surgical intervention. She wanted to try outpatient abx therapy but it failed and she was worried about worsening appearance so I directed her to come into ED. Discussed MRI findings with her as well as surgical options.   Past Medical History:  Diagnosis Date   Diabetes mellitus    History of blood transfusion    HTN (hypertension), benign 02/13/2012   Hypercholesterolemia    Hyperlipidemia    Hypertension    nodular lymphocyte predominate hodgkins lymphoma dx'd 10/2010   xrt comp 02/20/11   Peripheral vascular disease        Latest Ref Rng & Units 05/31/2024    3:41 AM 05/30/2024    1:48 PM 03/28/2024    4:07 AM  CBC  WBC 4.0 - 10.5 K/uL 10.4  11.4  8.0   Hemoglobin 12.0 - 15.0 g/dL 87.8  87.2  9.0   Hematocrit 36.0 - 46.0 % 37.7  39.0  27.8   Platelets 150 - 400 K/uL 331  343  374        Latest Ref Rng & Units 05/31/2024    3:41 AM 05/30/2024    1:48 PM 03/28/2024    4:07 AM  BMP  Glucose 70 - 99 mg/dL 801  738  817   BUN 8 - 23 mg/dL 11  13  5    Creatinine 0.44 - 1.00 mg/dL 9.44  9.33  9.44   Sodium 135 - 145 mmol/L 137  135  135   Potassium 3.5 - 5.1 mmol/L 3.7  4.5  3.5   Chloride 98 - 111 mmol/L 104  98  100   CO2 22 - 32 mmol/L 23  26  22    Calcium  8.9 - 10.3 mg/dL 9.2  9.4  8.3       Physical Exam: Lower Extremity Exam  Non palpable DP and PT pulses, though does have cft to remaining  toes, foot is warm to touch at midfoot.  Necrotic dehiscence of the 1st ray site with + probe to bone, erythema and edema of the 2nd ray  Prior L 3rd toe amputation         ASSESSMENT/PLAN OF CARE 74 y.o. 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   - Recommend TMA to try to get to healthy tissue envelope and close the area tmrw. Pt agrees to proceed. NPO p MN for OR tomorrow for Left foot TMA.  - Recommend vascular consultation as history of bypass and necrotic tissue at amp site - Continue IV abx broad spectrum pending further culture data - Anticoagulation: holding xaerlto pre op, ok to resume tmrw after surgery - Wound care: None required  pre op - WB status: will be NWB to Left foot post op - Will continue to follow   Thank you for the consult.  Please contact me directly with any questions or concerns.           Katie Hawkins, DPM [facility] / [facility]    [address] [place], [place] [address]                Office [phone]  Fax [phone]     "

## 2024-06-01 ENCOUNTER — Encounter (HOSPITAL_COMMUNITY): Payer: Self-pay | Admitting: Internal Medicine

## 2024-06-01 ENCOUNTER — Other Ambulatory Visit: Payer: Self-pay

## 2024-06-01 ENCOUNTER — Inpatient Hospital Stay (HOSPITAL_COMMUNITY): Payer: Self-pay | Admitting: Registered Nurse

## 2024-06-01 ENCOUNTER — Encounter (HOSPITAL_COMMUNITY)
Admission: EM | Disposition: A | Discharge status: Home / Self Care | Payer: Self-pay | Source: Home / Self Care | Attending: Internal Medicine

## 2024-06-01 ENCOUNTER — Inpatient Hospital Stay (HOSPITAL_COMMUNITY)

## 2024-06-01 DIAGNOSIS — I10 Essential (primary) hypertension: Secondary | ICD-10-CM

## 2024-06-01 DIAGNOSIS — M869 Osteomyelitis, unspecified: Secondary | ICD-10-CM

## 2024-06-01 DIAGNOSIS — E440 Moderate protein-calorie malnutrition: Secondary | ICD-10-CM | POA: Insufficient documentation

## 2024-06-01 DIAGNOSIS — E1169 Type 2 diabetes mellitus with other specified complication: Secondary | ICD-10-CM

## 2024-06-01 DIAGNOSIS — E11628 Type 2 diabetes mellitus with other skin complications: Secondary | ICD-10-CM

## 2024-06-01 HISTORY — PX: INCISION AND DRAINAGE OF WOUND: SHX1803

## 2024-06-01 LAB — GLUCOSE, CAPILLARY
Glucose-Capillary: 147 mg/dL — ABNORMAL HIGH (ref 70–99)
Glucose-Capillary: 149 mg/dL — ABNORMAL HIGH (ref 70–99)
Glucose-Capillary: 151 mg/dL — ABNORMAL HIGH (ref 70–99)
Glucose-Capillary: 158 mg/dL — ABNORMAL HIGH (ref 70–99)
Glucose-Capillary: 174 mg/dL — ABNORMAL HIGH (ref 70–99)
Glucose-Capillary: 244 mg/dL — ABNORMAL HIGH (ref 70–99)
Glucose-Capillary: 334 mg/dL — ABNORMAL HIGH (ref 70–99)

## 2024-06-01 LAB — SURGICAL PCR SCREEN
MRSA, PCR: POSITIVE — AB
Staphylococcus aureus: POSITIVE — AB

## 2024-06-01 MED ORDER — LACTATED RINGERS IV SOLN: INTRAVENOUS | Status: DC

## 2024-06-01 MED ORDER — ONDANSETRON HCL 4 MG/2ML IJ SOLN: 4.0000 mg | Freq: Four times a day (QID) | INTRAMUSCULAR | Status: DC | PRN

## 2024-06-01 MED ORDER — ENSURE PLUS HIGH PROTEIN PO LIQD
237.0000 mL | Freq: Two times a day (BID) | ORAL | Status: DC
  Administered 2024-06-01 – 2024-06-02 (×2): 237 mL via ORAL

## 2024-06-01 MED ORDER — ONDANSETRON HCL 4 MG/2ML IJ SOLN
INTRAMUSCULAR | Status: AC
  Filled 2024-06-01: qty 2

## 2024-06-01 MED ORDER — ADULT MULTIVITAMIN W/MINERALS CH
1.0000 | ORAL_TABLET | Freq: Every day | ORAL | Status: DC
  Administered 2024-06-01 – 2024-06-02 (×2): 1 via ORAL
  Filled 2024-06-01 (×2): qty 1

## 2024-06-01 MED ORDER — INSULIN ASPART 100 UNIT/ML IJ SOLN: 0.0000 [IU] | INTRAMUSCULAR | Status: DC | PRN

## 2024-06-01 MED ORDER — MIDAZOLAM HCL 2 MG/2ML IJ SOLN
INTRAMUSCULAR | Status: AC
  Filled 2024-06-01: qty 2

## 2024-06-01 MED ORDER — ONDANSETRON HCL 4 MG/2ML IJ SOLN
INTRAMUSCULAR | Status: DC | PRN
  Administered 2024-06-01: 4 mg via INTRAVENOUS

## 2024-06-01 MED ORDER — FENTANYL CITRATE (PF) 100 MCG/2ML IJ SOLN
INTRAMUSCULAR | Status: DC | PRN
  Administered 2024-06-01 (×2): 50 ug via INTRAVENOUS

## 2024-06-01 MED ORDER — CHLORHEXIDINE GLUCONATE 0.12 % MT SOLN: 15.0000 mL | Freq: Once | OROMUCOSAL | Status: AC

## 2024-06-01 MED ORDER — PHENYLEPHRINE 80 MCG/ML (10ML) SYRINGE FOR IV PUSH (FOR BLOOD PRESSURE SUPPORT)
PREFILLED_SYRINGE | INTRAVENOUS | Status: DC | PRN
  Administered 2024-06-01 (×4): 80 ug via INTRAVENOUS

## 2024-06-01 MED ORDER — OXYCODONE HCL 5 MG PO TABS: 5.0000 mg | ORAL_TABLET | Freq: Once | ORAL | Status: DC | PRN

## 2024-06-01 MED ORDER — FENTANYL CITRATE (PF) 100 MCG/2ML IJ SOLN: 25.0000 ug | INTRAMUSCULAR | Status: DC | PRN

## 2024-06-01 MED ORDER — SODIUM CHLORIDE 0.9 % IV SOLN: INTRAVENOUS | Status: DC

## 2024-06-01 MED ORDER — PHENYLEPHRINE 80 MCG/ML (10ML) SYRINGE FOR IV PUSH (FOR BLOOD PRESSURE SUPPORT)
PREFILLED_SYRINGE | INTRAVENOUS | Status: AC
  Filled 2024-06-01: qty 10

## 2024-06-01 MED ORDER — PROPOFOL 500 MG/50ML IV EMUL
INTRAVENOUS | Status: DC | PRN
  Administered 2024-06-01: 75 ug/kg/min via INTRAVENOUS
  Administered 2024-06-01: 20 mg via INTRAVENOUS
  Administered 2024-06-01: 30 mg via INTRAVENOUS

## 2024-06-01 MED ORDER — BUPIVACAINE-EPINEPHRINE (PF) 0.5% -1:200000 IJ SOLN
INTRAMUSCULAR | Status: DC | PRN
  Administered 2024-06-01: 25 mL via PERINEURAL

## 2024-06-01 MED ORDER — CHLORHEXIDINE GLUCONATE 0.12 % MT SOLN
OROMUCOSAL | Status: AC
  Administered 2024-06-01: 15 mL via OROMUCOSAL
  Filled 2024-06-01: qty 15

## 2024-06-01 MED ORDER — OXYCODONE HCL 5 MG/5ML PO SOLN: 5.0000 mg | Freq: Once | ORAL | Status: DC | PRN

## 2024-06-01 MED ORDER — ORAL CARE MOUTH RINSE: 15.0000 mL | Freq: Once | OROMUCOSAL | Status: AC

## 2024-06-01 MED ORDER — PROPOFOL 10 MG/ML IV BOLUS
INTRAVENOUS | Status: AC
  Filled 2024-06-01: qty 20

## 2024-06-01 MED ORDER — FENTANYL CITRATE (PF) 100 MCG/2ML IJ SOLN
INTRAMUSCULAR | Status: AC
  Filled 2024-06-01: qty 2

## 2024-06-01 MED ORDER — MIDAZOLAM HCL 5 MG/5ML IJ SOLN
INTRAMUSCULAR | Status: DC | PRN
  Administered 2024-06-01: 1 mg via INTRAVENOUS

## 2024-06-01 NOTE — Op Note (Signed)
 Full Operative Report  Date of Operation: 12:34 PM, 06/01/2024   Patient: Katie Hawkins - 74 y.o. female  Surgeon: Malvin Marsa FALCON, DPM   Assistant: None  Diagnosis: Osteomyelitis, dehiscence of surgical site  Procedure:  1.  Transmetatarsal amputation of left foot 2.  Application amniotic allograft, left foot    Anesthesia: Anesthesia type not filed in the log.  Maryclare Cornet, MD  Anesthesiologist: Maryclare Cornet, MD CRNA: Christopher Comings, CRNA; Evetta Delon BROCKS, CRNA   Estimated Blood Loss: Minimal approximately 10 mL  Hemostasis: 1) Anatomical dissection, mechanical compression, electrocautery 2) no tourniquet was used in procedure  Implants: Implant Name Type Inv. Item Serial No. Manufacturer Lot No. LRB No. Used Action  ALLOGRAFT AMNI BIOVANCE 5X5 1L - [id number] Graft ALLOGRAFT AMNI BIOVANCE 5X5 1L  [employer] [id number] Left 1 Implanted    Materials: Prolene 2-0 and skin staples  Injectables: 1) Pre-operatively: Preop block per anesthesia 2) Post-operatively: None   Specimens: - Pathology: Left forefoot - Microbiology: Bone culture first metatarsal stump   Antibiotics: IV antibiotics given per schedule on the floor  Drains: None  Complications: Patient tolerated the procedure well without complication.   Operative findings: As below in detailed report  Indications for Procedure: Katie Hawkins presents to Malvin Marsa FALCON, DPM with a chief complaint of nonhealing medial aspect of prior partial first ray amputation with necrotic tissue concern for underlying osteomyelitis of the 1st and 2nd rays.  The patient has failed conservative treatments of various modalities. At this time the patient has elected to proceed with surgical correction. All alternatives, risks, and complications of the procedures were thoroughly explained to the patient. Patient exhibits appropriate understanding of all discussion points and informed consent was signed  and obtained in the chart with no guarantees to surgical outcome given or implied.  Description of Procedure: Patient was brought to the operating room. Patient remained on their hospital bed in the supine position. A surgical timeout was performed and all members of the operating room, the procedure, and the surgical site were identified. anesthesia occurred as per anesthesia record. Local anesthetic as previously described was then injected about the operative field in a local infiltrative block.  The operative lower extremity as noted above was then prepped and draped in the usual sterile manner. The following procedure then began.  Attention was directed to the left lower extremity. A fish-mouth type incision was made proximal to the web spaces and encompassed the entire forefoot. The full-thickness incision was made with a longer plantar flap to allow for wound closure.  The incision was also made so as to excise the necrotic tissue present at the medial aspect of the foot at site of prior first ray amputation.  The incision was continued through the soft tissue down to the shafts of the metatarsal bones. A 10 blade was then used to free up the periosteum on all the metatarsal shafts.  At this time there was noted to be a little bit of purulence about the second metatarsal head.  Additionally a bone culture was harvested from the first metatarsal stump site which did appear necrotic and infected.  Using an oscillating saw, the metatarsals were cut in a dorsal distal to plantar proximal orientation. The first metatarsal was beveled so that the medial cortex was shorter than the lateral, and the fifth metatarsal was beveled so that the lateral cortex was shorter than the medial, thus less prominent. The amputation was done so that a metatarsal parabola was  maintained.  The distal portion including all the digits were freed from the metatarsals and soft tissue attachments. The specimen was passed off the  field and sent for gross pathology. All remaining non-viable and necrotic tissues were sharply resected and removed. Extensor and flexors tendons were grasped with a hemostat and cut proximally.  Minimal bleeding was seen throughout the procedure.  No hemostasis was needed with electrocautery.  The surgical site was then flushed with 1000ml of saline under power pulse lavage.   At that point the surgical site appeared very clean without any evidence of residual infection.  Decision was made to apply an amniotic allograft to promote healing.  A 5 x 5 piece of amniotic allograft was placed along the metatarsal stump sites.  This was done so as to promote healing especially neurovascular disadvantage patient.  The plantar flap was brought in approximation with the dorsal flap and the sutures material previously described was used for closure. Care was taken not to place the flaps under tension in order not to jeopardize the vascular supply.  The surgical site was then dressed with Xeroform 4 x 4 ABD pad Kerlix Ace. The patient tolerated both the procedure and anesthesia well with vital signs stable throughout. The patient was transferred in good condition and all vital signs stable  from the OR to recovery under the discretion of anesthesia.  Condition: Vital signs stable, neurovascular status unchanged from preoperative   Surgical plan:  Expect clean surgical margin there was significant necrotic tissue noted medially in the first metatarsal as well as along the second metatarsal.  Additionally there was some purulence seen around the second metatarsal head.  There is also minimal to no bleeding seen from the amputation site especially medially concerning for disadvantaged healing.  Patient high risk for further amputation in the future if she does not heal this TMA.  The patient will be nonweightbearing in a postop shoe to the operative limb until further instructed. The dressing is to remain clean, dry,  and intact. Will continue to follow unless noted elsewhere.   [name], DPM [facility]

## 2024-06-01 NOTE — Plan of Care (Signed)
" °  Problem: Fluid Volume: Goal: Ability to maintain a balanced intake and output will improve Outcome: Progressing   Problem: Metabolic: Goal: Ability to maintain appropriate glucose levels will improve Outcome: Progressing   Problem: Nutritional: Goal: Maintenance of adequate nutrition will improve Outcome: Progressing   Problem: Skin Integrity: Goal: Risk for impaired skin integrity will decrease Outcome: Progressing   Problem: Tissue Perfusion: Goal: Adequacy of tissue perfusion will improve Outcome: Progressing   Problem: Education: Goal: Knowledge of General Education information will improve Description: Including pain rating scale, medication(s)/side effects and non-pharmacologic comfort measures Outcome: Progressing   Problem: Clinical Measurements: Goal: Will remain free from infection Outcome: Progressing   Problem: Coping: Goal: Level of anxiety will decrease Outcome: Progressing   "

## 2024-06-01 NOTE — Transfer of Care (Signed)
 Immediate Anesthesia Transfer of Care Note  Patient: Katie Hawkins  Procedure(s) Performed: IRRIGATION AND DEBRIDEMENT WOUND (Left: Foot)  Patient Location: PACU  Anesthesia Type:MAC and Regional  Level of Consciousness: awake, alert , and patient cooperative  Airway & Oxygen Therapy: Patient Spontanous Breathing  Post-op Assessment: Report given to RN and Post -op Vital signs reviewed and stable  Post vital signs: Reviewed and stable  Last Vitals:  Vitals Value Taken Time  BP 104/82 06/01/24 12:32  Temp    Pulse 90 06/01/24 12:34  Resp 15 06/01/24 12:34  SpO2 97 % 06/01/24 12:34  Vitals shown include unfiled device data.  Last Pain:  Vitals:   06/01/24 1122  TempSrc:   PainSc: 0-No pain         Complications: There were no known notable events for this encounter.

## 2024-06-01 NOTE — Progress Notes (Signed)
 History and Physical Interval Note:  06/01/2024 11:26 AM  Katie Hawkins  has presented today for surgery, with the diagnosis of osteomyelitis and gangrene of right foot.  The various methods of treatment have been discussed with the patient and family. After consideration of risks, benefits and other options for treatment, the patient has consented to   Procedures with comments: LEFT FOOT TRANSMETATARSAL AMPUTATION as a surgical intervention.  The patient's history has been reviewed, patient examined, no change in status, stable for surgery.  I have reviewed the patient's chart and labs.  Questions were answered to the patient's satisfaction.     Marsa FALCON Anterrio Mccleery

## 2024-06-01 NOTE — TOC Initial Note (Signed)
 Transition of Care [facility]) - Initial/Assessment Note    Patient Details  Name: Katie Hawkins MRN: [id number] Date of Birth: 1950-07-21  Transition of [facility]) CM/SW Contact:    Katie DELENA Senters, RN Phone Number: 06/01/2024, 3:59 PM  Clinical Narrative:                 RR:fziprjo history significant of DM2, peripheral vascular disease involving left lower extremity status post bypass, HLD, osteomyelitis of the left great toe status post amputation by podiatry, history of Hodgkin's lymphoma.  Presented with left foot pain and swelling. S/P 1.  Transmetatarsal amputation of left foot 2.  Application amniotic allograft, left foot  Patient lives with her husband, reports having support at home and husband will be transportation home at d/c.   Patient has PCP, manages own medications, DME reviewed-RW, shower seat.   Therapy rec for [facility]. Patient in agreement with HH, has no agency preference. [facility] referral sent out in hub.   DME rec for W/C. Patient reports her home isn't set up to accommodate a W/C and refuses this. She wants to inquire about a knee scooter. CM to f/u with PT on this rec.   Continued medical workup.  CM will continue to follow.   Expected Discharge Plan: Home w Home Health Services Barriers to Discharge: Continued Medical Work up   Patient Goals and CMS Choice   CMS Medicare.gov Compare Post Acute Care list provided to:: Patient Choice offered to / list presented to : Patient      Expected Discharge Plan and Services       Living arrangements for the past 2 months: Single Family Home                                      Prior Living Arrangements/Services Living arrangements for the past 2 months: Single Family Home Lives with:: Self, Spouse Patient language and need for interpreter reviewed:: Yes Do you feel safe going back to the place where you live?: Yes      Need for Family Participation in Patient Care: Yes (Comment) Care giver support system in  place?: Yes (comment) Current home services: DME (Rolling walker, shower seat) Criminal Activity/Legal Involvement Pertinent to Current Situation/Hospitalization: No - Comment as needed  Activities of Daily Living   ADL Screening (condition at time of admission) Independently performs ADLs?: Yes (appropriate for developmental age) Is the patient deaf or have difficulty hearing?: No Does the patient have difficulty seeing, even when wearing glasses/contacts?: No Does the patient have difficulty concentrating, remembering, or making decisions?: No  Permission Sought/Granted                  Emotional Assessment Appearance:: Developmentally appropriate Attitude/Demeanor/Rapport: Engaged Affect (typically observed): Calm Orientation: : Oriented to Self, Oriented to Place, Oriented to  Time, Oriented to Situation Alcohol / Substance Use: Not Applicable Psych Involvement: No (comment)  Admission diagnosis:  Sebaceous cyst [L72.3] Wound infection [T14.8XXA, L08.9] Patient Active Problem List   Diagnosis Date Noted   Malnutrition of moderate degree 06/01/2024   Subacute osteomyelitis of left foot (HCC) 05/31/2024   Diabetic foot infection (HCC) 05/31/2024   Osteomyelitis of great toe of left foot (HCC) 03/25/2024   Critical lower limb ischemia (HCC) 03/21/2024   Ulcer of left foot (HCC) 03/11/2024   Cellulitis of great toe of left foot 03/11/2024   Chronic limb-threatening ischemia (HCC) 03/10/2024  PAD (peripheral artery disease) 06/09/2023   Gangrene of toe of left foot (HCC)    Peripheral arterial occlusive disease 10/30/2021   Adenoma of large intestine 09/19/2021   Neutrophilia 09/19/2021   Obesity 09/19/2021   Personal history of Hodgkin lymphoma 09/19/2021   Hypertension associated with diabetes (HCC) 02/13/2012   Type 2 diabetes mellitus with complication, without long-term current use of insulin  (HCC) 12/03/2010   Hyperlipidemia associated with type 2 diabetes  mellitus (HCC) 12/03/2010   Hodgkin's disease, stage IA (HCC) 11/26/2010   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 [place] [address] Phone: [phone] Fax: [phone]  [facility] [address] [place] [address] Phone: [phone] Fax: [phone]     Social Drivers of Health (SDOH) Social History: SDOH Screenings   Food Insecurity: No Food Insecurity (05/31/2024)  Housing: Low Risk (05/31/2024)  Transportation Needs: No Transportation Needs (05/31/2024)  Utilities: Not At Risk (05/31/2024)  Social Connections: Socially Integrated (05/31/2024)  Tobacco Use: Low Risk (06/01/2024)   SDOH Interventions:     Readmission Risk Interventions    03/24/2024    4:39 PM  Readmission Risk Prevention Plan  Transportation Screening Complete  HRI or Home Care Consult Complete  Social Work Consult for Recovery Care Planning/Counseling Complete  Palliative Care Screening Not Applicable  Medication Review Oceanographer) Complete

## 2024-06-01 NOTE — Anesthesia Procedure Notes (Signed)
 Anesthesia Regional Block: Popliteal block   Pre-Anesthetic Checklist: , timeout performed,  Correct Patient, Correct Site, Correct Laterality,  Correct Procedure, Correct Position, site marked,  Risks and benefits discussed,  Surgical consent,  Pre-op  evaluation,  At surgeon's request and post-op pain management  Laterality: Left  Prep: chloraprep       Needles:  Injection technique: Single-shot  Needle Type: Echogenic Stimulator Needle          Additional Needles:   Procedures:, nerve stimulator,,,,,     Nerve Stimulator or Paresthesia:  Response: plantar flexion of foot, 0.45 mA  Additional Responses:   Narrative:  Start time: 06/01/2024 11:37 AM End time: 06/01/2024 11:44 AM Injection made incrementally with aspirations every 5 mL.  Performed by: Personally  Anesthesiologist: Maryclare Cornet, MD  Additional Notes: Functioning IV was confirmed and monitors were applied.  A 90mm 21ga Arrow echogenic stimulator needle was used. Sterile prep and drape,hand hygiene and sterile gloves were used.  Negative aspiration and negative test dose prior to incremental administration of local anesthetic. The patient tolerated the procedure well.  Ultrasound guidance: relevent anatomy identified, needle position confirmed, local anesthetic spread visualized around nerve(s), vascular puncture avoided.  Image printed for medical record.

## 2024-06-01 NOTE — Progress Notes (Signed)
 Initial Nutrition Assessment  DOCUMENTATION CODES:   Non-severe (moderate) malnutrition in context of chronic illness  INTERVENTION:  Discussed with pt adequate nutrition for wound healing.   Liberalize from heart health carb mod to carb mod.  Ensure Plus High Protein po BID, each supplement provides 350 kcal and 20 grams of protein.   MVI with minerals daily   Pt would benefit from DM education prior to discharge   NUTRITION DIAGNOSIS:   Moderate Malnutrition related to chronic illness as evidenced by mild fat depletion, moderate muscle depletion, severe muscle depletion.   GOAL:   Patient will meet greater than or equal to 90% of their needs  MONITOR:   PO intake, Supplement acceptance  REASON FOR ASSESSMENT:   Consult Wound healing, Assessment of nutrition requirement/status  ASSESSMENT:   Pt had recent left big toe amputation leading to infection and osteomyelitis. No improvement or healing since surgery 10/31. PMH: diabetic, hyperlipidemia, HTN, PVD, in ca remission.  Nutrition hx provided by pt. PTA pt would eat a biscuit with meat and cheese for breakfast ~9am after drinking coffee, lunch would be vegetables with a tuna salad sandwich (pt would sometimes skip), dinners are split with husband, a meat with potato/bread/vegetable. No dessert but evening snack of fruit or snack mix. Drinks protein shakes 2-3x week. Pt claims to avoid carbohydrates as blood sugar has been hard to control. Since admission, pt ate peaches, fish, salad, and carrots. Pts appetite varies.    Pt reports weight fluctuates, per chart review wt stable over past several months. Pt reported slow wt loss with current wt of 157lbs.  1/7 NPO for left TMA surgery. Severe PAD, hx of vascular bypass surgery. MRI confirms recurrent osteomyelitis.    Medications reviewed and include:  novoLOG  0-9U q4h IV: 0,9% saline, MAXIPIME  2g q2h (abx) , VANCOREADY (abx)    Labs reviewed:  CBG: 130-221 A1C: 7.8   Pre Albumin: 9 CRP: 1.6  Current wt: 71.5 kg   NUTRITION - FOCUSED PHYSICAL EXAM:  Flowsheet Row Most Recent Value  Orbital Region Mild depletion  Upper Arm Region No depletion  Thoracic and Lumbar Region No depletion  Buccal Region Mild depletion  Temple Region Moderate depletion  Clavicle Bone Region Moderate depletion  Clavicle and Acromion Bone Region Mild depletion  Scapular Bone Region Unable to assess  Dorsal Hand Severe depletion  Patellar Region Moderate depletion  Anterior Thigh Region Moderate depletion  Posterior Calf Region Severe depletion  Edema (RD Assessment) None  Hair Reviewed  Eyes Reviewed  Mouth Reviewed  Skin Reviewed  Nails Reviewed    Diet Order:   Diet Order             Diet NPO time specified Except for: Sips with Meds  Diet effective midnight                   EDUCATION NEEDS:   Education needs have been addressed  Skin:  Skin Assessment: Skin Integrity Issues: Skin Integrity Issues:: Other (Comment) Other: Left great toe amputation plan for TMA today.  Last BM:  1/6  Height:   Ht Readings from Last 1 Encounters:  06/01/24 5' 3 (1.6 m)    Weight:   Wt Readings from Last 1 Encounters:  06/01/24 71.2 kg    BMI:  Body mass index is 27.81 kg/m.  Estimated Nutritional Needs:   Kcal:  1,600-1,800  Protein:  85- 105 grams  Fluid:  <1.6 L/day    Con Friends, Dietetic Intern

## 2024-06-01 NOTE — Progress Notes (Signed)
 Orthopedic Tech Progress Note Patient Details:  Katie Hawkins 04-20-1951 [id number]  Ortho Devices Type of Ortho Device: Postop shoe/boot Ortho Device/Splint Location: LLE Ortho Device/Splint Interventions: Ordered, Application, Adjustment   Post Interventions Patient Tolerated: Well Instructions Provided: Care of device  [name] Pac 06/01/2024, 1:18 PM

## 2024-06-01 NOTE — Progress Notes (Signed)
 "  PROGRESS NOTE    Katie Hawkins  FMW:[id number] DOB: 26-Dec-1950 DOA: 05/30/2024 PCP: Claudene Pellet, MD   Brief Narrative:  Katie Hawkins is a 74 y.o. female with a history of PAD, diabetes mellitus, hodgkin lymphoma, hyperlipidemia, hyperlipidemia, hypertension, osteomyelitis.  Patient presented secondary to wound infection of her left foot stump with initial concern for osteomyelitis. Empiric antibiotics obtained and MRI confirms evidence of osteomyelitis. Podiatry plan for a transmetatarsal amputation. Infectious disease and vascular surgery also consulted.   Assessment/Plan:  Left foot cellulitis Left foot ulcer Left foot osteomyelitis Patient follows with vascular surgery and podiatry as an outpatient. She is s/p left first ray amputation by podiatry and has a history of multiple bypass surgeries. Blood cultures obtained.  - Empirically on IV vancomycin  and cefepime .   - Vascular surgery input greatly appreciated, patient is vascular maximized at this point, means at high risk for further amputation, but reasonable to try TMA first.   -. MRI confirms evidence of osteomyelitis involving the first metatarsal shaft transsection margin in addition to possibly the second metatarsal head.  This post TMA 06/01/2024 - Antibiotics management per ID  Infected chest wall cyst Drained while in the emergency department; documented purulent fluid evacuated. No cultures obtained.  PAD Patient has significant vascular history. She follows with vascular surgery. Multiple bypass surgeries. - Continue aspirin  - Please see above discussion  Hyperlipidemia - Continue Zetia  and Lipitor  Diabetes mellitus type 2 Poorly controlled based on hemoglobin A1C of 7.6%. prior to arrival medication(s) includes metformin . - Continue SSI  Primary hypertension - Continue metoprolol   History of hodgkin lymphoma Currently in remission.   DVT prophylaxis: SCDs Code Status:   Code Status: Full  Code Family Communication: None at bedside Disposition Plan: Discharge pending ongoing specialist recommendations   Consultants:  Podiatry Infectious disease Vascular surgery  Procedures:  None  Antimicrobials: Vancomycin  Cefepime     Subjective:  Patient was seen earlier this morning before her surgery, she was asking when she can eat and about timing of surgery, otherwise she denies any complaints, she is hopeful to go home in 1 to 2 days after surgery.   Objective: BP 100/63   Pulse 92   Temp 97.7 F (36.5 C)   Resp 12   Ht 5' 3 (1.6 m)   Wt 71.2 kg   SpO2 95%   BMI 27.81 kg/m   Examination:  Awake Alert, Oriented X 3, No new F.N deficits, Normal affect Symmetrical Chest wall movement, Good air movement bilaterally, CTAB RRR,No Gallops,Rubs or new Murmurs, No Parasternal Heave +ve B.Sounds, Abd Soft, No tenderness, No rebound - guarding or rigidity. Foot first digit stump with open necrotic wound and purulent discharge,  Patient was seen and examined this morning before surgery.  Data Reviewed: I have personally reviewed following labs and imaging studies   Last CBC Lab Results  Component Value Date   WBC 10.4 05/31/2024   HGB 12.1 05/31/2024   HCT 37.7 05/31/2024   MCV 93.5 05/31/2024   MCH 30.0 05/31/2024   RDW 14.1 05/31/2024   PLT 331 05/31/2024     Last metabolic panel Lab Results  Component Value Date   GLUCOSE 198 (H) 05/31/2024   NA 137 05/31/2024   K 3.7 05/31/2024   CL 104 05/31/2024   CO2 23 05/31/2024   BUN 11 05/31/2024   CREATININE 0.55 05/31/2024   GFRNONAA >60 05/31/2024   CALCIUM  9.2 05/31/2024   PHOS 2.9 05/31/2024   PROT 6.7 05/31/2024  ALBUMIN 3.7 05/31/2024   BILITOT 0.3 05/31/2024   ALKPHOS 68 05/31/2024   AST 22 05/31/2024   ALT 14 05/31/2024   ANIONGAP 10 05/31/2024     Creatinine Clearance: Estimated Creatinine Clearance: 58.3 mL/min (by C-G formula based on SCr of 0.55 mg/dL).  Recent Results (from the  past 240 hours)  Blood culture (routine x 2)     Status: None (Preliminary result)   Collection Time: 05/30/24  5:54 PM   Specimen: BLOOD  Result Value Ref Range Status   Specimen Description BLOOD SITE NOT SPECIFIED  Final   Special Requests   Final    BOTTLES DRAWN AEROBIC AND ANAEROBIC Blood Culture results may not be optimal due to an inadequate volume of blood received in culture bottles   Culture   Final    NO GROWTH 2 DAYS Performed at [facility], [address] [place], [place] [address]    Report Status PENDING  Incomplete  Blood culture (routine x 2)     Status: None (Preliminary result)   Collection Time: 05/30/24  6:18 PM   Specimen: BLOOD  Result Value Ref Range Status   Specimen Description BLOOD SITE NOT SPECIFIED  Final   Special Requests   Final    BOTTLES DRAWN AEROBIC AND ANAEROBIC Blood Culture adequate volume   Culture   Final    NO GROWTH 2 DAYS Performed at [facility] [place] [facility], [address] [place], [place] [address]    Report Status PENDING  Incomplete      Radiology Studies: MR FOOT LEFT WO CONTRAST Result Date: 05/31/2024 CLINICAL DATA:  Left foot pain. Status post partial first ray amputation 03/25/2024. History of diabetes. EXAM: MRI OF THE LEFT FOOT WITHOUT CONTRAST TECHNIQUE: Multiplanar, multisequence MR imaging of the left foot was performed. No intravenous contrast was administered. COMPARISON:  None 4 radiographs dated 05/30/2024. MRI of the foot dated 03/10/2024. FINDINGS: Despite efforts by the technologist and patient, motion artifact is present on today's exam and could not be eliminated. This reduces exam sensitivity and specificity. Bones/Joint/Cartilage Status post partial first ray amputation with wound at the amputation stump extending deep to the first metatarsal transsection margin. Cortical indistinctness of the first metatarsal shaft transsection margin with T2/STIR hyperintense and T1 hypointense marrow signal abnormality is  compatible with osteomyelitis. Marrow edema of the second metatarsal head with suggestion of subtle corresponding T1 hypointensity is suspicious for osteomyelitis. No additional acute osseous abnormality identified. Status post third toe amputation at the MTP joint. No significant joint effusion. Ligaments Lisfranc ligament is intact. Muscles and Tendons Postsurgical changes related to partial first ray amputation and amputation of the third toe. Similar subtle nodularity of the medial band of the plantar fascia distally, cannot exclude mild plantar fibromatosis. Soft tissue No loculated fluid collection. Circumferential subcutaneous edema of the midfoot extending through the toes. IMPRESSION: 1. Status post partial first ray amputation with wound at the amputation stump extending deep to the first metatarsal transsection margin. Cortical indistinctness of the first metatarsal shaft transsection margin with marrow signal abnormality is compatible with osteomyelitis. 2. Marrow edema of the second metatarsal head with suggestion of subtle corresponding T1 hypointensity is suspicious for osteomyelitis. 3. No loculated fluid collection. 4. Circumferential subcutaneous edema of the midfoot extending through the toes. Electronically Signed   By: Harrietta Sherry M.D.   On: 05/31/2024 08:33   DG Foot Complete Left Result Date: 05/30/2024 EXAM: 3 OR MORE VIEW(S) XRAY OF THE LEFT FOOT 05/30/2024 02:11:00  PM COMPARISON: 03/25/2024 CLINICAL HISTORY: 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 Status post third digit amputation. Old healed fracture of the distal fifth metatarsal. Plantar and posterior calcaneal spurs. Enthesopathic changes of the Achilles tendon. No acute fracture. No malalignment. SOFT TISSUES: Soft tissue ulceration just distal to the osteotomy margin. Vascular calcifications. IMPRESSION: 1. Status  post transmetatarsal amputation of the first digit and amputation of the third digit with soft tissue ulceration just distal to the osteotomy margin. Electronically signed by: Katheleen Faes MD 05/30/2024 02:49 PM EST RP Workstation: [id number]      LOS: 2 days    Brayton Lye , MD [facility] 06/01/2024, 2:02 PM   If 7PM-7AM, please contact night-coverage [email or web address]  "

## 2024-06-01 NOTE — Anesthesia Preprocedure Evaluation (Signed)
"                                    Anesthesia Evaluation  Patient identified by MRN, date of birth, ID band Patient awake    Reviewed: Allergy & Precautions, H&P , NPO status , Patient's Chart, lab work & pertinent test results  Airway Mallampati: II   Neck ROM: full    Dental   Pulmonary neg pulmonary ROS   breath sounds clear to auscultation       Cardiovascular hypertension, + Peripheral Vascular Disease   Rhythm:regular Rate:Normal     Neuro/Psych    GI/Hepatic   Endo/Other  diabetes, Type 2    Renal/GU      Musculoskeletal   Abdominal   Peds  Hematology   Anesthesia Other Findings   Reproductive/Obstetrics                              Anesthesia Physical Anesthesia Plan  ASA: 3  Anesthesia Plan: MAC and Regional   Post-op Pain Management:    Induction: Intravenous  PONV Risk Score and Plan: 2 and Propofol  infusion and Treatment may vary due to age or medical condition  Airway Management Planned: Simple Face Mask  Additional Equipment:   Intra-op Plan:   Post-operative Plan:   Informed Consent: I have reviewed the patients History and Physical, chart, labs and discussed the procedure including the risks, benefits and alternatives for the proposed anesthesia with the patient or authorized representative who has indicated his/her understanding and acceptance.     Dental advisory given  Plan Discussed with: CRNA, Anesthesiologist and Surgeon  Anesthesia Plan Comments:         Anesthesia Quick Evaluation  "

## 2024-06-02 ENCOUNTER — Encounter (HOSPITAL_COMMUNITY): Payer: Self-pay | Admitting: Podiatry

## 2024-06-02 ENCOUNTER — Other Ambulatory Visit (HOSPITAL_COMMUNITY): Payer: Self-pay

## 2024-06-02 DIAGNOSIS — M868X7 Other osteomyelitis, ankle and foot: Secondary | ICD-10-CM

## 2024-06-02 DIAGNOSIS — Z89432 Acquired absence of left foot: Secondary | ICD-10-CM

## 2024-06-02 DIAGNOSIS — B958 Unspecified staphylococcus as the cause of diseases classified elsewhere: Secondary | ICD-10-CM

## 2024-06-02 DIAGNOSIS — E1169 Type 2 diabetes mellitus with other specified complication: Secondary | ICD-10-CM | POA: Diagnosis not present

## 2024-06-02 LAB — BASIC METABOLIC PANEL WITH GFR
Anion gap: 8 (ref 5–15)
BUN: 12 mg/dL (ref 8–23)
CO2: 24 mmol/L (ref 22–32)
Calcium: 9.1 mg/dL (ref 8.9–10.3)
Chloride: 107 mmol/L (ref 98–111)
Creatinine, Ser: 0.42 mg/dL — ABNORMAL LOW (ref 0.44–1.00)
GFR, Estimated: >60 mL/min
Glucose, Bld: 136 mg/dL — ABNORMAL HIGH (ref 70–99)
Potassium: 3.6 mmol/L (ref 3.5–5.1)
Sodium: 140 mmol/L (ref 135–145)

## 2024-06-02 LAB — CBC
HCT: 33.9 % — ABNORMAL LOW (ref 36.0–46.0)
Hemoglobin: 11.2 g/dL — ABNORMAL LOW (ref 12.0–15.0)
MCH: 30.3 pg (ref 26.0–34.0)
MCHC: 33 g/dL (ref 30.0–36.0)
MCV: 91.6 fL (ref 80.0–100.0)
Platelets: 326 K/uL (ref 150–400)
RBC: 3.7 MIL/uL — ABNORMAL LOW (ref 3.87–5.11)
RDW: 14.2 % (ref 11.5–15.5)
WBC: 10.4 K/uL (ref 4.0–10.5)
nRBC: 0 % (ref 0.0–0.2)

## 2024-06-02 LAB — GLUCOSE, CAPILLARY
Glucose-Capillary: 137 mg/dL — ABNORMAL HIGH (ref 70–99)
Glucose-Capillary: 154 mg/dL — ABNORMAL HIGH (ref 70–99)
Glucose-Capillary: 219 mg/dL — ABNORMAL HIGH (ref 70–99)

## 2024-06-02 MED ORDER — DOXYCYCLINE HYCLATE 100 MG PO TABS
100.0000 mg | ORAL_TABLET | Freq: Two times a day (BID) | ORAL | Status: DC
  Administered 2024-06-02: 100 mg via ORAL
  Filled 2024-06-02: qty 1

## 2024-06-02 MED ORDER — AMOXICILLIN-POT CLAVULANATE 875-125 MG PO TABS
1.0000 | ORAL_TABLET | Freq: Two times a day (BID) | ORAL | 0 refills | Status: AC
  Filled 2024-06-02: qty 10, 5d supply, fill #0

## 2024-06-02 MED ORDER — AMOXICILLIN-POT CLAVULANATE 875-125 MG PO TABS
1.0000 | ORAL_TABLET | Freq: Two times a day (BID) | ORAL | Status: DC
  Administered 2024-06-02: 1 via ORAL
  Filled 2024-06-02: qty 1

## 2024-06-02 MED ORDER — LEVOFLOXACIN 500 MG PO TABS
500.0000 mg | ORAL_TABLET | Freq: Every day | ORAL | Status: DC
  Administered 2024-06-02: 500 mg via ORAL
  Filled 2024-06-02: qty 1

## 2024-06-02 MED ORDER — DOXYCYCLINE HYCLATE 100 MG PO TABS
100.0000 mg | ORAL_TABLET | Freq: Two times a day (BID) | ORAL | 0 refills | Status: AC
  Filled 2024-06-02: qty 10, 5d supply, fill #0

## 2024-06-02 NOTE — Progress Notes (Signed)
"  °  Subjective:  Patient ID: Katie Hawkins, female    DOB: 10/10/1950,  MRN: [id number]  Chief Complaint  Patient presents with   Wound Infection    DOS: 06/01/2024 Procedure: 1.  Transmetatarsal amputation of left foot 2.  Application amniotic allograft, left foot  74 y.o. female seen for post op check.  Patient reports she is doing well this morning she denies pain.  She is hopeful discharge home today.  Discussed aftercare and follow-up plans.  Discussed I recommend she try and keep all weight off the left foot as it heals.  Hopeful for healing though did notice decreased bleeding at the amputation site yesterday.  Review of Systems: Negative except as noted in the HPI. Denies N/V/F/Ch.   Objective:   Constitutional Well developed. Well nourished.  Vascular Foot warm and well perfused. Capillary refill normal to all digits.   No calf pain with palpation  Neurologic Normal speech. Oriented to person, place, and time. Epicritic sensation diminished to left foot  Dermatologic Dressing is clean dry and intact  Orthopedic: Status post left foot TMA   Radiographs: Status post transmetatarsal amputation as expected  Pathology: Pending  Micro: Rare GPC, prior culture with Pseudomonas sensitive to Levaquin   Assessment:   1. Wound infection   2. Sebaceous cyst   Osteomyelitis and necrotic changes at first ray amputation site status post transmetatarsal amputation left foot  Plan:  Patient was evaluated and treated and all questions answered.  POD # 1 s/p left foot TMA -Progressing as expected postoperatively doing well this morning pain is controlled. -Will be stable for discharge this morning -XR: Expected postoperative changes -WB Status: Nonweightbearing in postop shoe to left lower extremity -Sutures: Sutures and staples remain intact. -Medications/ABX: Recommend 5 days of Levaquin  on discharge -Dressing: Leave dressing clean dry and intact until office follow-up  scheduled for next Tuesday - F/u Plan: Patient stable for discharge home this a.m. when cleared by hospitalist.  Following up next week Tuesday appointment is set.  Will sign off.        [name], DPM [facility]  "

## 2024-06-02 NOTE — Anesthesia Postprocedure Evaluation (Signed)
"   Anesthesia Post Note  Patient: Katie Hawkins  Procedure(s) Performed: IRRIGATION AND DEBRIDEMENT WOUND (Left: Foot)     Patient location during evaluation: PACU Anesthesia Type: Regional and MAC Level of consciousness: awake and alert Pain management: pain level controlled Vital Signs Assessment: post-procedure vital signs reviewed and stable Respiratory status: spontaneous breathing, nonlabored ventilation, respiratory function stable and patient connected to nasal cannula oxygen Cardiovascular status: stable and blood pressure returned to baseline Postop Assessment: no apparent nausea or vomiting Anesthetic complications: no   There were no known notable events for this encounter.  Last Vitals:  Vitals:   06/02/24 0419 06/02/24 0905  BP: (!) 125/44 (!) 160/62  Pulse: 84 98  Resp: 15 15  Temp: 36.5 C 36.5 C  SpO2: 96% 97%    Last Pain:  Vitals:   06/02/24 1104  TempSrc:   PainSc: 2                  Omair Dettmer S      "

## 2024-06-02 NOTE — Discharge Summary (Signed)
 Physician Discharge Summary  Katie Hawkins FMW:[id number] DOB: February 08, 1951 DOA: 05/30/2024  PCP: Katie Pellet, MD  Admit date: 05/30/2024 Discharge date: 06/02/2024  Admitted From: (Home) Disposition:  (Home )  Recommendations for Outpatient Follow-up:  Follow up with PCP in 1-2 weeks Please obtain BMP/CBC in one week Patient instructed to keep her F/U appointment with ID and podiatry as outpatient.  Home Health: (YES)  Diet recommendation: Heart Healthy / Carb Modified    Brief/Interim Summary:  Katie Hawkins is a 74 y.o. female with a history of PAD, diabetes mellitus, hodgkin lymphoma, hyperlipidemia, hyperlipidemia, hypertension, osteomyelitis.  Patient presented secondary to wound infection of her left foot stump with initial concern for osteomyelitis. Empiric antibiotics obtained and MRI confirms evidence of osteomyelitis. Podiatry plan for a transmetatarsal amputation. Infectious disease and vascular surgery also consulted.   Left foot cellulitis Left foot infected wound  Left foot osteomyelitis Patient follows with vascular surgery and podiatry as an outpatient. She is s/p left first ray amputation by podiatry and has a history of multiple bypass surgeries. Blood cultures obtained. Blood cultures remain negative. - Empirically on IV vancomycin  and cefepime  on admission - Vascular surgery input greatly appreciated, patient is vascular maximized at this point, remains high risk for further amputation, but reasonable to try TMA first.   -. MRI confirms evidence of osteomyelitis involving the first metatarsal shaft transsection margin in addition to possibly the second metatarsal head.   -status post left TMA, and application of amniotic allograft of the left foot by Dr. Malvin Blunt on  06/01/2024 - Discussed antibiotic on discharge with ID, she will be discharged on doxycycline  and Augmentin  for 5 days as intraoperative culture growing moderate Staph aureus.  She will  follow-up with podiatry next week and with ID in couple weeks as an outpatient   Infected chest wall cyst Drained while in the emergency department; documented purulent fluid evacuated. No cultures obtained.   PAD Patient has significant vascular history. She follows with vascular surgery. Multiple bypass surgeries. - Continue aspirin  - She remains on Xarelto  for DVT prophylaxis   Hyperlipidemia - Continue Zetia  and Lipitor   Diabetes mellitus type 2 Poorly controlled based on hemoglobin A1C of 7.6%. prior to arrival medication(s) includes metformin . - On insulin  sliding scale during hospital stay, will discharge on home regimen   Primary hypertension - Continue metoprolol    History of hodgkin lymphoma Currently in remission.    Discharge Diagnoses:  Active Problems:   Type 2 diabetes mellitus with complication, without long-term current use of insulin  (HCC)   Hyperlipidemia associated with type 2 diabetes mellitus (HCC)   Hodgkin's disease, stage IA (HCC)   Hypertension associated with diabetes (HCC)   PAD (peripheral artery disease)   Ulcer of left foot (HCC)   Osteomyelitis of great toe of left foot (HCC)   Subacute osteomyelitis of left foot (HCC)   Diabetic foot infection (HCC)   Malnutrition of moderate degree    Discharge Instructions  Discharge Instructions     Discharge wound care:   Complete by: As directed    -Dressing: Leave dressing clean dry and intact until office follow-up scheduled for next Tuesday   Increase activity slowly   Complete by: As directed       Allergies as of 06/02/2024   No Known Allergies      Medication List     STOP taking these medications    fluconazole  100 MG tablet Commonly known as: Diflucan    levofloxacin  500 MG tablet  Commonly known as: LEVAQUIN        TAKE these medications    amoxicillin -clavulanate 875-125 MG tablet Commonly known as: AUGMENTIN  Take 1 tablet by mouth every 12 (twelve) hours for 5  days.   aspirin  EC 81 MG tablet Take 1 tablet (81 mg total) by mouth daily. Swallow whole.   atorvastatin  40 MG tablet Commonly known as: LIPITOR Take 1 tablet (40 mg total) by mouth at bedtime.   doxycycline  100 MG tablet Commonly known as: VIBRA -TABS Take 1 tablet (100 mg total) by mouth every 12 (twelve) hours for 5 days.   DRY EYE RELIEF DROPS OP Place 1 drop into both eyes daily as needed (dry eye).   ezetimibe  10 MG tablet Commonly known as: ZETIA  Take 10 mg by mouth daily.   glimepiride 2 MG tablet Commonly known as: AMARYL Take 2 mg by mouth at bedtime.   metFORMIN  500 MG 24 hr tablet Commonly known as: GLUCOPHAGE -XR Take 500 mg by mouth daily with breakfast.   metoprolol  tartrate 25 MG tablet Commonly known as: LOPRESSOR  Take 1 tablet (25 mg total) by mouth 2 (two) times daily.   MULTIVITAMIN GUMMIES ADULT PO Take 2 tablets by mouth daily.   mupirocin  ointment 2 % Commonly known as: BACTROBAN  Apply 1 Application topically daily.   oxyCODONE -acetaminophen  5-325 MG tablet Commonly known as: PERCOCET/ROXICET Take 1 tablet by mouth every 6 (six) hours as needed for moderate pain (pain score 4-6).   rivaroxaban  2.5 MG Tabs tablet Commonly known as: XARELTO  Take 1 tablet (2.5 mg total) by mouth 2 (two) times daily.               Durable Medical Equipment  (From admission, onward)           Start     Ordered   06/02/24 1104  For home use only DME Bedside commode  Once       Question:  Patient needs a bedside commode to treat with the following condition  Answer:  Weakness   06/02/24 1103   06/02/24 1052  For home use only DME lightweight manual wheelchair with seat cushion  Once       Comments: Patient suffers from weakness which impairs their ability to perform daily activities like bathing, dressing, and grooming in the home.  A walker will not resolve  issue with performing activities of daily living. A wheelchair will allow patient to safely  perform daily activities. Patient is not able to propel themselves in the home using a standard weight wheelchair due to general weakness. Patient can self propel in the lightweight wheelchair. Length of need 12 months . Accessories: elevating leg rests (ELRs), wheel locks, extensions and anti-tippers.   06/02/24 1051              Discharge Care Instructions  (From admission, onward)           Start     Ordered   06/02/24 0000  Discharge wound care:       Comments: -Dressing: Leave dressing clean dry and intact until office follow-up scheduled for next Tuesday   06/02/24 1039            Contact information for after-discharge care     Home Medical Care     [facility]) .   Service: Home Health Services Contact information: [address] [place]  [address] [phone]  Allergies[1]  Consultations: Podiatry ID   Procedures/Studies: DG Foot 2 Views Left Result Date: 06/01/2024 EXAM: 2 VIEW(S) XRAY OF THE LEFT FOOT 06/01/2024 01:08:00 PM COMPARISON: 05/30/2024 CLINICAL HISTORY: Post-operative state FINDINGS: BONES AND JOINTS: Status post transmetatarsal amputation. Plantar and posterior calcaneal spurs. No acute fracture. No malalignment. SOFT TISSUES: Surgical skin staples overlying soft tissue. Soft tissue edema and emphysema. Vascular calcifications. IMPRESSION: 1. Status post transmetatarsal amputation with surgical skin staples and associated soft tissue edema and emphysema. Electronically signed by: Greig Pique MD MD 06/01/2024 04:36 PM EST RP Workstation: [id number]   MR FOOT LEFT WO CONTRAST Result Date: 05/31/2024 CLINICAL DATA:  Left foot pain. Status post partial first ray amputation 03/25/2024. History of diabetes. EXAM: MRI OF THE LEFT FOOT WITHOUT CONTRAST TECHNIQUE: Multiplanar, multisequence MR imaging of the left foot was performed. No intravenous contrast was  administered. COMPARISON:  None 4 radiographs dated 05/30/2024. MRI of the foot dated 03/10/2024. FINDINGS: Despite efforts by the technologist and patient, motion artifact is present on today's exam and could not be eliminated. This reduces exam sensitivity and specificity. Bones/Joint/Cartilage Status post partial first ray amputation with wound at the amputation stump extending deep to the first metatarsal transsection margin. Cortical indistinctness of the first metatarsal shaft transsection margin with T2/STIR hyperintense and T1 hypointense marrow signal abnormality is compatible with osteomyelitis. Marrow edema of the second metatarsal head with suggestion of subtle corresponding T1 hypointensity is suspicious for osteomyelitis. No additional acute osseous abnormality identified. Status post third toe amputation at the MTP joint. No significant joint effusion. Ligaments Lisfranc ligament is intact. Muscles and Tendons Postsurgical changes related to partial first ray amputation and amputation of the third toe. Similar subtle nodularity of the medial band of the plantar fascia distally, cannot exclude mild plantar fibromatosis. Soft tissue No loculated fluid collection. Circumferential subcutaneous edema of the midfoot extending through the toes. IMPRESSION: 1. Status post partial first ray amputation with wound at the amputation stump extending deep to the first metatarsal transsection margin. Cortical indistinctness of the first metatarsal shaft transsection margin with marrow signal abnormality is compatible with osteomyelitis. 2. Marrow edema of the second metatarsal head with suggestion of subtle corresponding T1 hypointensity is suspicious for osteomyelitis. 3. No loculated fluid collection. 4. Circumferential subcutaneous edema of the midfoot extending through the toes. Electronically Signed   By: Harrietta Sherry M.D.   On: 05/31/2024 08:33   DG Foot Complete Left Result Date: 05/30/2024 EXAM: 3 OR  MORE VIEW(S) XRAY OF THE LEFT FOOT 05/30/2024 02:11:00 PM COMPARISON: 03/25/2024 CLINICAL HISTORY: 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 Status post third digit amputation. Old healed fracture of the distal fifth metatarsal. Plantar and posterior calcaneal spurs. Enthesopathic changes of the Achilles tendon. No acute fracture. No malalignment. SOFT TISSUES: Soft tissue ulceration just distal to the osteotomy margin. Vascular calcifications. IMPRESSION: 1. Status post transmetatarsal amputation of the first digit and amputation of the third digit with soft tissue ulceration just distal to the osteotomy margin. Electronically signed by: Katheleen Faes MD 05/30/2024 02:49 PM EST RP Workstation: [id number]   DG Foot Complete Left Result Date: 05/24/2024 Please see detailed radiograph report in office note.  VAS US  ABI WITH/WO TBI Result Date: 05/03/2024  LOWER EXTREMITY DOPPLER STUDY Patient Name:  PURVA VESSELL  Date of Exam:   05/03/2024 Medical Rec #: [id number]         Accession #:  [id number] Date of Birth: [birth year]         Patient Gender: F Patient Age:   24 years Exam Location:  [address] Procedure:      VAS US  ABI WITH/WO TBI Referring Phys: DEBBY ROBERTSON --------------------------------------------------------------------------------  Indications: Peripheral artery disease. critical limb ischemia Left great toe High Risk Factors: Hypertension, Diabetes, no history of smoking.  Vascular Interventions: 03/23/2024: Left common femoral to peroneal artery                         bypass with 6mm ePTFE in subfascial tunnel. Comparison Study: 03/21/2024 Performing Technologist: Stoney Ross RVT  Examination Guidelines: A complete evaluation includes at minimum, Doppler waveform signals and systolic blood pressure reading at the level of bilateral brachial, anterior tibial, and posterior  tibial arteries, when vessel segments are accessible. Bilateral testing is considered an integral part of a complete examination. Photoelectric Plethysmograph (PPG) waveforms and toe systolic pressure readings are included as required and additional duplex testing as needed. Limited examinations for reoccurring indications may be performed as noted.  ABI Findings: +---------+------------------+-----+----------+--------+ Right    Rt Pressure (mmHg)IndexWaveform  Comment  +---------+------------------+-----+----------+--------+ Brachial 156                                       +---------+------------------+-----+----------+--------+ ATA      113               0.72 monophasic         +---------+------------------+-----+----------+--------+ PTA      110               0.71 monophasic         +---------+------------------+-----+----------+--------+ Great Toe34                0.22                    +---------+------------------+-----+----------+--------+ +---------+------------------+-----+-----------+---------+ Left     Lt Pressure (mmHg)IndexWaveform   Comment   +---------+------------------+-----+-----------+---------+ Brachial 147                                         +---------+------------------+-----+-----------+---------+ ATA      175               1.12 multiphasic          +---------+------------------+-----+-----------+---------+ PTA      182               1.17 multiphasic          +---------+------------------+-----+-----------+---------+ Great Toe                                  amputated +---------+------------------+-----+-----------+---------+ +-------+-----------+-----------+------------+------------+ ABI/TBIToday's ABIToday's TBIPrevious ABIPrevious TBI +-------+-----------+-----------+------------+------------+ Right  0.72       0.22       0.82        0.11         +-------+-----------+-----------+------------+------------+ Left    1.17       amp        0.55        0.00         +-------+-----------+-----------+------------+------------+  Right ABIs appear decreased compared to prior study on 03/21/2024. Left ABIs appear increased compared  to prior study on 03/21/2024.  Summary: Right: Resting right ankle-brachial index indicates moderate right lower extremity arterial disease. The right toe-brachial index is abnormal.  Left: Resting left ankle-brachial index is within normal range.  *See table(s) above for measurements and observations.  Electronically signed by Debby Robertson on 05/03/2024 at 4:36:17 PM.    Final    VAS US  LOWER EXTREMITY ARTERIAL DUPLEX Result Date: 05/03/2024 LOWER EXTREMITY ARTERIAL DUPLEX STUDY Patient Name:  KADI HESSION  Date of Exam:   05/03/2024 Medical Rec #: [id number]         Accession #:    [id number] Date of Birth: 07/30/1950         Patient Gender: F Patient Age:   27 years Exam Location:  [address] Procedure:      VAS US  LOWER EXTREMITY ARTERIAL DUPLEX Referring Phys: DEBBY ROBERTSON --------------------------------------------------------------------------------  Indications: Peripheral artery disease, and f/u bypass graft. High Risk Factors: Hypertension, Diabetes.  Vascular Interventions: 03/23/2024: Left common femoral to peroneal artery                         bypass with 6mm ePTFE in subfascial tunnel. Current ABI:            R=0.72, L= 1.17 Performing Technologist: Stoney Ross RVT  Examination Guidelines: A complete evaluation includes B-mode imaging, spectral Doppler, color Doppler, and power Doppler as needed of all accessible portions of each vessel. Bilateral testing is considered an integral part of a complete examination. Limited examinations for reoccurring indications may be performed as noted.  Left Graft #1: CFA - Peroneal artery +--------------------+--------+--------+---------+--------+                     PSV cm/sStenosisWaveform Comments  +--------------------+--------+--------+---------+--------+ Inflow              117             triphasic         +--------------------+--------+--------+---------+--------+ Proximal Anastomosis80              biphasic          +--------------------+--------+--------+---------+--------+ Proximal Graft      86              biphasic          +--------------------+--------+--------+---------+--------+ Mid Graft           78              biphasic          +--------------------+--------+--------+---------+--------+ Distal Graft        46              biphasic          +--------------------+--------+--------+---------+--------+ Distal Anastomosis  124             biphasic          +--------------------+--------+--------+---------+--------+ Outflow             63              biphasic          +--------------------+--------+--------+---------+--------+ Incidental finding of a nonvascular fluid collection suggestive of a possible hematoma in the left groin measuring 2.4 x 3.2 cm.  Summary: Left: Widely patent left common femoral artery to peroneal artery bypass graft without evidence of significant stenosis.  See table(s) above for measurements and observations. Electronically signed by Debby Robertson on 05/03/2024 at 4:36:00 PM.    Final  Subjective: He denies any complaints this morning, eager to go home  Discharge Exam: Vitals:   06/02/24 0419 06/02/24 0905  BP: (!) 125/44 (!) 160/62  Pulse: 84 98  Resp: 15 15  Temp: 97.7 F (36.5 C) 97.7 F (36.5 C)  SpO2: 96% 97%   Vitals:   06/01/24 2054 06/02/24 0000 06/02/24 0419 06/02/24 0905  BP:  (!) 126/56 (!) 125/44 (!) 160/62  Pulse:  79 84 98  Resp:  17 15 15   Temp:  97.7 F (36.5 C) 97.7 F (36.5 C) 97.7 F (36.5 C)  TempSrc:  Oral Oral Oral  SpO2: 99% 95% 96% 97%  Weight:      Height:        General: Pt is alert, awake, not in acute distress Cardiovascular: RRR, S1/S2 +, no rubs, no  gallops Respiratory: CTA bilaterally, no wheezing, no rhonchi Abdominal: Soft, NT, ND, bowel sounds + Extremities: no edema, no cyanosis, left foot bandaged    The results of significant diagnostics from this hospitalization (including imaging, microbiology, ancillary and laboratory) are listed below for reference.     Microbiology: Recent Results (from the past 240 hours)  Blood culture (routine x 2)     Status: None (Preliminary result)   Collection Time: 05/30/24  5:54 PM   Specimen: BLOOD  Result Value Ref Range Status   Specimen Description BLOOD SITE NOT SPECIFIED  Final   Special Requests   Final    BOTTLES DRAWN AEROBIC AND ANAEROBIC Blood Culture results may not be optimal due to an inadequate volume of blood received in culture bottles   Culture   Final    NO GROWTH 3 DAYS Performed at [facility], [address], [place], [place] [address]    Report Status PENDING  Incomplete  Blood culture (routine x 2)     Status: None (Preliminary result)   Collection Time: 05/30/24  6:18 PM   Specimen: BLOOD  Result Value Ref Range Status   Specimen Description BLOOD SITE NOT SPECIFIED  Final   Special Requests   Final    BOTTLES DRAWN AEROBIC AND ANAEROBIC Blood Culture adequate volume   Culture   Final    NO GROWTH 3 DAYS Performed at [facility], [address] [place], [place] [address]    Report Status PENDING  Incomplete  Surgical pcr screen     Status: Abnormal   Collection Time: 06/01/24  7:14 AM   Specimen: Nasal Mucosa; Nasal Swab  Result Value Ref Range Status   MRSA, PCR POSITIVE (A) NEGATIVE Final    Comment: RESULT CALLED TO, READ BACK BY AND VERIFIED WITH: RN Kara Ruth on [phone] @1444  by sm    Staphylococcus aureus POSITIVE (A) NEGATIVE Final    Comment: (NOTE) The Xpert SA Assay (FDA approved for NASAL specimens in patients 68 years of age and older), is one component of a comprehensive surveillance program. It is not intended to diagnose  infection nor to guide or monitor treatment. Performed at [facility], [address], [place], [place] [address]   Aerobic/Anaerobic Culture w Gram Stain (surgical/deep wound)     Status: None (Preliminary result)   Collection Time: 06/01/24 12:19 PM   Specimen: Foot, Left; Tissue  Result Value Ref Range Status   Specimen Description TISSUE  Final   Special Requests LEFT FOOT BONE CULTURE  Final   Gram Stain   Final    FEW WBC PRESENT, PREDOMINANTLY PMN RARE GRAM POSITIVE  COCCI    Culture   Final    MODERATE STAPHYLOCOCCUS AUREUS SUSCEPTIBILITIES TO FOLLOW Performed at [facility], [address] [place], [place] [address]    Report Status PENDING  Incomplete     Labs: BNP (last 3 results) No results for input(s): BNP in the last 8760 hours. Basic Metabolic Panel: Recent Labs  Lab 05/30/24 1348 05/31/24 0341 06/02/24 0337  NA 135 137 140  K 4.5 3.7 3.6  CL 98 104 107  CO2 26 23 24   GLUCOSE 261* 198* 136*  BUN 13 11 12   CREATININE 0.66 0.55 0.42*  CALCIUM  9.4 9.2 9.1  MG  --  1.8  --   PHOS  --  2.9  --    Liver Function Tests: Recent Labs  Lab 05/31/24 0341  AST 22  ALT 14  ALKPHOS 68  BILITOT 0.3  PROT 6.7  ALBUMIN 3.7   No results for input(s): LIPASE, AMYLASE in the last 168 hours. No results for input(s): AMMONIA in the last 168 hours. CBC: Recent Labs  Lab 05/30/24 1348 05/31/24 0341 06/02/24 0337  WBC 11.4* 10.4 10.4  NEUTROABS 8.3*  --   --   HGB 12.7 12.1 11.2*  HCT 39.0 37.7 33.9*  MCV 91.1 93.5 91.6  PLT 343 331 326   Cardiac Enzymes: No results for input(s): CKTOTAL, CKMB, CKMBINDEX, TROPONINI in the last 168 hours. BNP: Invalid input(s): POCBNP CBG: Recent Labs  Lab 06/01/24 1611 06/01/24 1928 06/02/24 0014 06/02/24 0418 06/02/24 0910  GLUCAP 244* 334* 154* 137* 219*   D-Dimer No results for input(s): DDIMER in the last 72 hours. Hgb A1c Recent Labs    05/31/24 0341  HGBA1C 7.8*    Lipid Profile No results for input(s): CHOL, HDL, LDLCALC, TRIG, CHOLHDL, LDLDIRECT in the last 72 hours. Thyroid  function studies No results for input(s): TSH, T4TOTAL, T3FREE, THYROIDAB in the last 72 hours.  Invalid input(s): FREET3 Anemia work up No results for input(s): VITAMINB12, FOLATE, FERRITIN, TIBC, IRON, RETICCTPCT in the last 72 hours. Urinalysis    Component Value Date/Time   COLORURINE YELLOW 03/23/2024 0510   APPEARANCEUR CLEAR 03/23/2024 0510   LABSPEC 1.014 03/23/2024 0510   PHURINE 6.0 03/23/2024 0510   GLUCOSEU 50 (A) 03/23/2024 0510   HGBUR NEGATIVE 03/23/2024 0510   BILIRUBINUR NEGATIVE 03/23/2024 0510   KETONESUR NEGATIVE 03/23/2024 0510   PROTEINUR NEGATIVE 03/23/2024 0510   NITRITE NEGATIVE 03/23/2024 0510   LEUKOCYTESUR SMALL (A) 03/23/2024 0510   Sepsis Labs Recent Labs  Lab 05/30/24 1348 05/31/24 0341 06/02/24 0337  WBC 11.4* 10.4 10.4   Microbiology Recent Results (from the past 240 hours)  Blood culture (routine x 2)     Status: None (Preliminary result)   Collection Time: 05/30/24  5:54 PM   Specimen: BLOOD  Result Value Ref Range Status   Specimen Description BLOOD SITE NOT SPECIFIED  Final   Special Requests   Final    BOTTLES DRAWN AEROBIC AND ANAEROBIC Blood Culture results may not be optimal due to an inadequate volume of blood received in culture bottles   Culture   Final    NO GROWTH 3 DAYS Performed at [facility], [address], [place], [place] [address]    Report Status PENDING  Incomplete  Blood culture (routine x 2)     Status: None (Preliminary result)   Collection Time: 05/30/24  6:18 PM   Specimen: BLOOD  Result Value Ref Range Status   Specimen Description  BLOOD SITE NOT SPECIFIED  Final   Special Requests   Final    BOTTLES DRAWN AEROBIC AND ANAEROBIC Blood Culture adequate volume   Culture   Final    NO GROWTH 3 DAYS Performed at [facility], [address], [place], [place] [address]    Report Status PENDING  Incomplete  Surgical pcr screen     Status: Abnormal   Collection Time: 06/01/24  7:14 AM   Specimen: Nasal Mucosa; Nasal Swab  Result Value Ref Range Status   MRSA, PCR POSITIVE (A) NEGATIVE Final    Comment: RESULT CALLED TO, READ BACK BY AND VERIFIED WITH: RN Kara Ruth on [phone] @[phone]  by sm    Staphylococcus aureus POSITIVE (A) NEGATIVE Final    Comment: (NOTE) The Xpert SA Assay (FDA approved for NASAL specimens in patients 86 years of age and older), is one component of a comprehensive surveillance program. It is not intended to diagnose infection nor to guide or monitor treatment. Performed at [facility], [address], [place], [place] [address]   Aerobic/Anaerobic Culture w Gram Stain (surgical/deep wound)     Status: None (Preliminary result)   Collection Time: 06/01/24 12:19 PM   Specimen: Foot, Left; Tissue  Result Value Ref Range Status   Specimen Description TISSUE  Final   Special Requests LEFT FOOT BONE CULTURE  Final   Gram Stain   Final    FEW WBC PRESENT, PREDOMINANTLY PMN RARE GRAM POSITIVE COCCI    Culture   Final    MODERATE STAPHYLOCOCCUS AUREUS SUSCEPTIBILITIES TO FOLLOW Performed at [facility], [address] [place], [place] [address]    Report Status PENDING  Incomplete     Time coordinating discharge: Over 30 minutes  SIGNED:   Brayton Lye, MD  [facility] 06/02/2024, 11:57 AM Pager   If 7PM-7AM, please contact night-coverage [email or web address]     [1] No Known Allergies

## 2024-06-02 NOTE — Progress Notes (Signed)
" °  °  Durable Medical Equipment  (From admission, onward)           Start     Ordered   06/02/24 1052  For home use only DME lightweight manual wheelchair with seat cushion  Once       Comments: Patient suffers from weakness which impairs their ability to perform daily activities like bathing, dressing, and grooming in the home.  A walker will not resolve  issue with performing activities of daily living. A wheelchair will allow patient to safely perform daily activities. Patient is not able to propel themselves in the home using a standard weight wheelchair due to general weakness. Patient can self propel in the lightweight wheelchair. Length of need 12 months . Accessories: elevating leg rests (ELRs), wheel locks, extensions and anti-tippers.   06/02/24 1051            "

## 2024-06-02 NOTE — Progress Notes (Signed)
 "   [facility]  Date of Admission:  05/30/2024     Reason for Follow Up: Diabetic foot infection (HCC)  Total days of antibiotics 4         ASSESSMENT:  Katie Hawkins is a 74 y/o female with Type 2 diabetes, s/p left ray amputation, and vascular intervention admitted from Podiatry office with concern for left foot infection and found to have dehiscence of the surgical site and osteomyelitis.  Katie Hawkins is POD #1 from transmetatarsal amputation of the left foot with surgical specimens growing Staphylococcus aureus with sensitivities pending. Margins were determined to be clear per Podiatry operative note. Discussed plan of care to change antibiotics to doxycycline  100 mg PO bid and amoxicillin -clavulanate 875/125mg  PO bid for 5 days and has scheduled follow up in ID clinic. Post-operative wound care per Podiatry. Discussed importance of adequate protein intake to aid in healing and with decreased vascular function this does place her at risk for complicated healing and further infection. Ok for discharge from ID standpoint. Remaining medical and supportive care per Internal Medicine.   PLAN:  Change antibiotics to doxycycline  100 mg po BID and amoxicillin -clavulanate 875/125 mg po BID for 5 days post-op.  Post-operative wound care per Podiatry.  Monitor cultures for any additional organisms although should be well covered.  Optimize protein intake and continue blood sugar control Follow up with Dr. Fleeta Rothman in the clinic.  Ok for discharge from ID standpoint.  Remaining medical and supportive care per Internal Medicine.    Principal Problem:   Diabetic foot infection (HCC) Active Problems:   Type 2 diabetes mellitus with complication, without long-term current use of insulin  (HCC)   Hyperlipidemia associated with type 2 diabetes mellitus (HCC)   Hodgkin's disease, stage IA (HCC)   Hypertension associated with diabetes (HCC)   PAD (peripheral artery disease)    Ulcer of left foot (HCC)   Osteomyelitis of great toe of left foot (HCC)   Subacute osteomyelitis of left foot (HCC)   Malnutrition of moderate degree    amoxicillin -clavulanate  1 tablet Oral Q12H   aspirin  EC  81 mg Oral Daily   atorvastatin   40 mg Oral QHS   doxycycline   100 mg Oral Q12H   ezetimibe   10 mg Oral Daily   feeding supplement  237 mL Oral BID BM   insulin  aspart  0-9 Units Subcutaneous Q4H   metoprolol  tartrate  25 mg Oral BID   multivitamin with minerals  1 tablet Oral Daily    SUBJECTIVE:  Afebrile overnight with no acute events. Tolerating antibiotics with no adverse side effects. Has questions about circulation and improving chances to heal. No current pain in left foot which is wrapped in surgical dressing.   Allergies[1]   Review of Systems: Review of Systems  Constitutional:  Negative for chills, fever and weight loss.  Respiratory:  Negative for cough, shortness of breath and wheezing.   Cardiovascular:  Negative for chest pain and leg swelling.  Gastrointestinal:  Negative for abdominal pain, constipation, diarrhea, nausea and vomiting.  Skin:  Negative for rash.      OBJECTIVE: Vitals:   06/01/24 2054 06/02/24 0000 06/02/24 0419 06/02/24 0905  BP:  (!) 126/56 (!) 125/44 (!) 160/62  Pulse:  79 84 98  Resp:  17 15 15   Temp:  97.7 F (36.5 C) 97.7 F (36.5 C) 97.7 F (36.5 C)  TempSrc:  Oral Oral Oral  SpO2: 99% 95% 96% 97%  Weight:  Height:       Body mass index is 27.81 kg/m.  Physical Exam Constitutional:      General: She is not in acute distress.    Appearance: She is well-developed.  Cardiovascular:     Rate and Rhythm: Normal rate and regular rhythm.     Heart sounds: Normal heart sounds.  Pulmonary:     Effort: Pulmonary effort is normal.     Breath sounds: Normal breath sounds.  Skin:    General: Skin is warm and dry.  Neurological:     Mental Status: She is alert.     Lab Results Lab Results  Component Value  Date   WBC 10.4 06/02/2024   HGB 11.2 (L) 06/02/2024   HCT 33.9 (L) 06/02/2024   MCV 91.6 06/02/2024   PLT 326 06/02/2024    Lab Results  Component Value Date   CREATININE 0.42 (L) 06/02/2024   BUN 12 06/02/2024   NA 140 06/02/2024   K 3.6 06/02/2024   CL 107 06/02/2024   CO2 24 06/02/2024    Lab Results  Component Value Date   ALT 14 05/31/2024   AST 22 05/31/2024   ALKPHOS 68 05/31/2024   BILITOT 0.3 05/31/2024     Microbiology: Recent Results (from the past 240 hours)  Blood culture (routine x 2)     Status: None (Preliminary result)   Collection Time: 05/30/24  5:54 PM   Specimen: BLOOD  Result Value Ref Range Status   Specimen Description BLOOD SITE NOT SPECIFIED  Final   Special Requests   Final    BOTTLES DRAWN AEROBIC AND ANAEROBIC Blood Culture results may not be optimal due to an inadequate volume of blood received in culture bottles   Culture   Final    NO GROWTH 3 DAYS Performed at [facility], [address], [place], [place] [address]    Report Status PENDING  Incomplete  Blood culture (routine x 2)     Status: None (Preliminary result)   Collection Time: 05/30/24  6:18 PM   Specimen: BLOOD  Result Value Ref Range Status   Specimen Description BLOOD SITE NOT SPECIFIED  Final   Special Requests   Final    BOTTLES DRAWN AEROBIC AND ANAEROBIC Blood Culture adequate volume   Culture   Final    NO GROWTH 3 DAYS Performed at [facility], [address], [place], [place] [address]    Report Status PENDING  Incomplete  Surgical pcr screen     Status: Abnormal   Collection Time: 06/01/24  7:14 AM   Specimen: Nasal Mucosa; Nasal Swab  Result Value Ref Range Status   MRSA, PCR POSITIVE (A) NEGATIVE Final    Comment: RESULT CALLED TO, READ BACK BY AND VERIFIED WITH: RN Kara Ruth on [phone] @1444  by sm    Staphylococcus aureus POSITIVE (A) NEGATIVE Final    Comment: (NOTE) The Xpert SA Assay (FDA approved for NASAL specimens in patients  68 years of age and older), is one component of a comprehensive surveillance program. It is not intended to diagnose infection nor to guide or monitor treatment. Performed at [facility], [address] [place], [place] [address]   Aerobic/Anaerobic Culture w Gram Stain (surgical/deep wound)     Status: None (Preliminary result)   Collection Time: 06/01/24 12:19 PM   Specimen: Foot, Left; Tissue  Result Value Ref Range Status   Specimen Description TISSUE  Final   Special Requests LEFT  FOOT BONE CULTURE  Final   Gram Stain   Final    FEW WBC PRESENT, PREDOMINANTLY PMN RARE GRAM POSITIVE COCCI    Culture   Final    MODERATE STAPHYLOCOCCUS AUREUS SUSCEPTIBILITIES TO FOLLOW Performed at [facility], [address] [place], [place] [address]    Report Status PENDING  Incomplete     Cathlyn July, NP [facility]  06/02/2024  1:53 PM     [1] No Known Allergies  "

## 2024-06-02 NOTE — TOC Transition Note (Addendum)
 Transition of Care [facility]) - Discharge Note   Patient Details  Name: Katie Hawkins MRN: [id number] Date of Birth: [birth year]  [facility] CM/SW Contact:  [name], RN Phone Number: 06/02/2024, 10:59 AM   Clinical Narrative:     Patient will be discharging home today, with husband providing transportation.   HHPT rec per therapy. Patient agreeable to this. HH arranged through [facility], info on AVS.   DME-W/C and BSC rec per therapy. W/C and BSC ordered through [employer] and to be delivered to room.  No dressings supplies needed as patient will leave current dressing in place until f/u with Podiatry next week.   No further needs identified by CM .  Final next level of care: Home w Home Health Services Barriers to Discharge: No Barriers Identified   Patient Goals and CMS Choice   CMS Medicare.gov Compare Post Acute Care list provided to:: Patient Choice offered to / list presented to : Patient      Discharge Placement                       Discharge Plan and Services Additional resources added to the After Visit Summary for                  DME Arranged: Wheelchair manual DME Agency: [employer] Date DME Agency Contacted: 06/02/24 Time DME Agency Contacted: 1058 Representative spoke with at DME Agency: [place] HH Arranged: PT HH Agency: [facility] Date [facility] Agency Contacted: 06/02/24 Time HH Agency Contacted: 1059 Representative spoke with at [facility] Agency: [place]  Social Drivers of Health (SDOH) Interventions SDOH Screenings   Food Insecurity: No Food Insecurity (05/31/2024)  Housing: Low Risk (05/31/2024)  Transportation Needs: No Transportation Needs (05/31/2024)  Utilities: Not At Risk (05/31/2024)  Social Connections: Socially Integrated (05/31/2024)  Tobacco Use: Low Risk (06/01/2024)     Readmission Risk Interventions    03/24/2024    4:39 PM  Readmission Risk Prevention Plan  Transportation Screening Complete  HRI or  Home Care Consult Complete  Social Work Consult for Recovery Care Planning/Counseling Complete  Palliative Care Screening Not Applicable  Medication Review Oceanographer) Complete

## 2024-06-02 NOTE — Care Management Important Message (Signed)
 Important Message  Patient Details  Name: COURTLAND COPPA MRN: [id number] Date of Birth: Jun 16, 1950   Important Message Given:  Yes - Medicare IM  Patient left prior to IM delivery will mail a copy to the patient home address.   Courtany Mcmurphy 06/02/2024, 2:51 PM

## 2024-06-02 NOTE — Progress Notes (Signed)
 Physical Therapy Treatment Patient Details Name: Katie Hawkins MRN: [id number] DOB: 04/26/1951 Today's Date: 06/02/2024   History of Present Illness 74 yo F presented 05/30/24 with wound dehiscence/infection L foot. Plan for transmet amputation 06/01/24.  PMH DM2, PVD w/ LUE byspass, HLD, osteomyelitis L great toe s/p amputation,  Hodgkins lymphoma    PT Comments  Pt received in supine and agreeable to session. Pt requests to use the bathroom and is able to ambulate with RW and CGA-minA for balance. Pt able to maintain LLE NWB throughout with cues for technique. Education provided on activity progression and exercises for LLE to maintain strength while NWB. Extensive discussion with pt and pt's husband about home set up and safe use of DME. Discussed using w/c primarily and RW to areas that the w/c does not fit. Pt continues to benefit from PT services to progress toward functional mobility goals.     If plan is discharge home, recommend the following: A little help with walking and/or transfers;Assist for transportation;Help with stairs or ramp for entrance   Can travel by private vehicle        Equipment Recommendations  Wheelchair (measurements PT);Wheelchair cushion (measurements PT)    Recommendations for Other Services       Precautions / Restrictions Precautions Precautions: Fall Recall of Precautions/Restrictions: Intact Restrictions Weight Bearing Restrictions Per Provider Order: Yes LLE Weight Bearing Per Provider Order: Non weight bearing     Mobility  Bed Mobility Overal bed mobility: Modified Independent             General bed mobility comments: use of bed features    Transfers Overall transfer level: Needs assistance Equipment used: Rolling walker (2 wheels) Transfers: Sit to/from Stand Sit to Stand: Min assist, Contact guard assist           General transfer comment: Cues for hand placement and anterior weight shift. min A for power up progressing  to CGA    Ambulation/Gait Ambulation/Gait assistance: Min assist, Contact guard assist Gait Distance (Feet): 10 Feet (x2) Assistive device: Rolling walker (2 wheels) Gait Pattern/deviations: Step-to pattern, Trunk flexed, Decreased stride length (hop-to)       General Gait Details: Pt able to maintain LLE NWB, but demonstrates difficulty utilizing BUE for support and tends to hop on LLE rather than swing through despite cues and demonstration   Stairs Stairs:  (discussed use of w/c with husband reporting experience with this technique and feels comfortable)           Wheelchair Mobility     Tilt Bed    Modified Rankin (Stroke Patients Only)       Balance Overall balance assessment: Needs assistance Sitting-balance support: No upper extremity supported, Feet unsupported Sitting balance-Leahy Scale: Normal Sitting balance - Comments: EOB   Standing balance support: Bilateral upper extremity supported, Reliant on assistive device for balance, During functional activity Standing balance-Leahy Scale: Poor Standing balance comment: reliant on RW for LLE NWB                            Communication Communication Communication: No apparent difficulties  Cognition Arousal: Alert Behavior During Therapy: WFL for tasks assessed/performed   PT - Cognitive impairments: No apparent impairments                         Following commands: Intact      Cueing Cueing Techniques: Verbal cues  Exercises General  Exercises - Lower Extremity Long Arc Quad: AROM, Seated, Both, 5 reps Hip Flexion/Marching: AROM, Seated, Both, 5 reps    General Comments        Pertinent Vitals/Pain Pain Assessment Pain Assessment: No/denies pain     PT Goals (current goals can now be found in the care plan section) Acute Rehab PT Goals Patient Stated Goal: go home ASAP after surgery PT Goal Formulation: With patient Time For Goal Achievement: 06/14/24 Progress  towards PT goals: Progressing toward goals    Frequency    Min 3X/week       AM-PAC PT 6 Clicks Mobility   Outcome Measure  Help needed turning from your back to your side while in a flat bed without using bedrails?: None Help needed moving from lying on your back to sitting on the side of a flat bed without using bedrails?: None Help needed moving to and from a bed to a chair (including a wheelchair)?: A Little Help needed standing up from a chair using your arms (e.g., wheelchair or bedside chair)?: A Little Help needed to walk in hospital room?: A Little Help needed climbing 3-5 steps with a railing? : Total 6 Click Score: 18    End of Session Equipment Utilized During Treatment: Gait belt Activity Tolerance: Patient limited by fatigue;Patient tolerated treatment well Patient left: in bed;with call bell/phone within reach;with bed alarm set;with family/visitor present Nurse Communication: Mobility status PT Visit Diagnosis: Difficulty in walking, not elsewhere classified (R26.2);Muscle weakness (generalized) (M62.81)     Time: 9060-8965 PT Time Calculation (min) (ACUTE ONLY): 55 min  Charges:    $Gait Training: 8-22 mins $Therapeutic Exercise: 8-22 mins $Therapeutic Activity: 23-37 mins PT General Charges $$ ACUTE PT VISIT: 1 Visit                    Darryle George, PTA Acute Rehabilitation Services Secure Chat Preferred  [phone]) [phone]    Darryle George 06/02/2024, 11:16 AM

## 2024-06-03 LAB — SURGICAL PATHOLOGY

## 2024-06-04 LAB — CULTURE, BLOOD (ROUTINE X 2)
Culture: NO GROWTH
Culture: NO GROWTH
Special Requests: ADEQUATE

## 2024-06-06 LAB — AEROBIC/ANAEROBIC CULTURE W GRAM STAIN (SURGICAL/DEEP WOUND)

## 2024-06-07 ENCOUNTER — Ambulatory Visit: Admitting: Podiatry

## 2024-06-07 ENCOUNTER — Encounter: Payer: Self-pay | Admitting: Podiatry

## 2024-06-07 DIAGNOSIS — M86272 Subacute osteomyelitis, left ankle and foot: Secondary | ICD-10-CM

## 2024-06-07 DIAGNOSIS — Z9889 Other specified postprocedural states: Secondary | ICD-10-CM

## 2024-06-07 NOTE — Progress Notes (Signed)
"  °  Subjective:  Patient ID: Katie Hawkins, female    DOB: 09/19/1950,  MRN: [id number]  Chief Complaint  Patient presents with   Routine Post Op    Transmetatarsal amputation of left foot.0 pain. Taking antibiotics. Wearing surgical shoe. A1C 7.8.     DOS: 06/01/2024 Procedure: 1.  Transmetatarsal amputation of left foot 2.  Application amniotic allograft, left foot  74 y.o. female seen for post op check.   Patient presents for follow-up approximately 1 week out from left foot transmetatarsal amputation.  She is denying pain taking antibiotics as previously prescribed.  Has been nonweightbearing with assist of a wheelchair on the left foot wearing postoperative shoe.  Has left dressing clean dry and intact as instructed.  Review of Systems: Negative except as noted in the HPI. Denies N/V/F/Ch.   Objective:   Constitutional Well developed. Well nourished.  Vascular Foot warm and well perfused. Capillary refill normal to all digits.   No calf pain with palpation  Neurologic Normal speech. Oriented to person, place, and time. Epicritic sensation diminished to left foot  Dermatologic Amputation site overall very well coapted no significant drainage or erythema.  There is an area of mild maceration at the medial aspect of the amputation site possibly from some mild venous oozing versus mild tissue necrosis though difficult to fully assess given there was some bloody drainage seen during dressing change.   Orthopedic: Status post left foot TMA   Radiographs: Status post transmetatarsal amputation as expected  Pathology: A. FOOT, LEFT FOREFOOT, AMPUTATION:  Ulcer with acute inflammation and necrosis.  Osteomyelitis.  Osseous margin negative for osteomyelitis   Micro:  MODERATE METHICILLIN RESISTANT STAPHYLOCOCCUS AUREUS  NO ANAEROBES ISOLATED   Assessment:   1. Subacute osteomyelitis of left foot (HCC)   2. Post-operative state    Osteomyelitis and necrotic changes at first  ray amputation site status post transmetatarsal amputation left foot  Plan:  Patient was evaluated and treated and all questions answered.  1 week s/p left foot TMA -Progressing as expected postoperatively.  Overall the left foot is improved and looking pretty good today.  No dehiscence or erythema there is a area of mild maceration medially that we will try to correct with Betadine wet-to-dry's as below -XR: Expected postoperative changes -WB Status: Nonweightbearing in postop shoe to left lower extremity -Sutures: Sutures and staples remain intact. -Medications/ABX: Status post course of p.o. antibiotics on discharge for 5 days no further antibiotics indicated -Dressing: Applied Betadine wet-to-dry dressing today recommend every other day Betadine wet-to-dry dressing to the left foot - F/u Plan: Patient to follow-up next week Thursday for dressing change wound check        [name], DPM [facility] / [facility]  "

## 2024-06-09 ENCOUNTER — Ambulatory Visit: Payer: Self-pay | Admitting: Infectious Diseases

## 2024-06-16 ENCOUNTER — Ambulatory Visit: Admitting: Podiatry

## 2024-06-16 ENCOUNTER — Encounter: Payer: Self-pay | Admitting: Podiatry

## 2024-06-16 DIAGNOSIS — M86272 Subacute osteomyelitis, left ankle and foot: Secondary | ICD-10-CM

## 2024-06-16 DIAGNOSIS — Z9889 Other specified postprocedural states: Secondary | ICD-10-CM

## 2024-06-16 NOTE — Progress Notes (Signed)
"  °  Subjective:  Patient ID: Katie Hawkins, female    DOB: [birth year],  MRN: [id number]  No chief complaint on file.   DOS: 06/01/2024 Procedure: 1.  Transmetatarsal amputation of left foot 2.  Application amniotic allograft, left foot  74 y.o. female seen for post op check.   She is now about 2 weeks out from above surgery.  Patient reports she is doing well she has occasional numbness tingling type pains more so at night.  Discussed this is neuropathy type pain.  Review of Systems: Negative except as noted in the HPI. Denies N/V/F/Ch.   Objective:   Constitutional Well developed. Well nourished.  Vascular Foot warm and well perfused. Capillary refill normal to all digits.   No calf pain with palpation  Neurologic Normal speech. Oriented to person, place, and time. Epicritic sensation diminished to left foot  Dermatologic Amputation site left foot TMA with improved healing from prior.  The small aspect on the medial side that had some drainage is now drying up and healing.  No evidence of significant necrosis maceration drainage or evidence of residual infection      Orthopedic: Status post left foot TMA   Radiographs: Status post transmetatarsal amputation as expected  Pathology: A. FOOT, LEFT FOREFOOT, AMPUTATION:  Ulcer with acute inflammation and necrosis.  Osteomyelitis.  Osseous margin negative for osteomyelitis   Micro:  MODERATE METHICILLIN RESISTANT STAPHYLOCOCCUS AUREUS  NO ANAEROBES ISOLATED   Assessment:   1. Subacute osteomyelitis of left foot (HCC)   2. Post-operative state     Osteomyelitis and necrotic changes at first ray amputation site status post transmetatarsal amputation left foot  Plan:  Patient was evaluated and treated and all questions answered.  2 week s/p left foot TMA -Progressing as expected postoperatively.  Overall the left foot is improved and looking pretty good today.  No dehiscence or erythema and a small area of maceration that  was draining is drying out.  Continue current dressing plans -XR: Expected postoperative changes -WB Status: Nonweightbearing in postop shoe to left lower extremity -Sutures: Sutures and staples remain intact for another week -Medications/ABX: Status post course of p.o. antibiotics on discharge for 5 days no further antibiotics indicated -Dressing: Applied Betadine wet-to-dry dressing today recommend every other day Betadine wet-to-dry dressing to the left foot - F/u Plan: Follow-up in 1 week        Katie Hawkins, DPM [facility] / [facility]  "

## 2024-06-21 NOTE — Progress Notes (Unsigned)
 " Chief complaint: followup for osteomyelitis Subjective:    Patient ID: Katie Hawkins, female    DOB: 12-30-1950, 74 y.o.   MRN: [id number]  HPI  Past Medical History:  Diagnosis Date   Diabetes mellitus    History of blood transfusion    HTN (hypertension), benign 02/13/2012   Hypercholesterolemia    Hyperlipidemia    Hypertension    nodular lymphocyte predominate hodgkins lymphoma dx'd 10/2010   xrt comp 02/20/11   Peripheral vascular disease     Past Surgical History:  Procedure Laterality Date   ABDOMINAL AORTOGRAM W/LOWER EXTREMITY N/A 10/18/2021   Procedure: ABDOMINAL AORTOGRAM W/LOWER EXTREMITY;  Surgeon: Magda Debby SAILOR, MD;  Location: [facility];  Service: Cardiovascular;  Laterality: N/A;   ABDOMINAL AORTOGRAM W/LOWER EXTREMITY N/A 09/30/2022   Procedure: ABDOMINAL AORTOGRAM W/LOWER EXTREMITY;  Surgeon: Serene Gaile ORN, MD;  Location: [facility];  Service: Cardiovascular;  Laterality: N/A;   ABDOMINAL AORTOGRAM W/LOWER EXTREMITY N/A 06/09/2023   Procedure: ABDOMINAL AORTOGRAM W/LOWER EXTREMITY;  Surgeon: Serene Gaile ORN, MD;  Location: [facility];  Service: Cardiovascular;  Laterality: N/A;   ABDOMINAL AORTOGRAM W/LOWER EXTREMITY N/A 03/11/2024   Procedure: ABDOMINAL AORTOGRAM W/LOWER EXTREMITY;  Surgeon: Magda Debby SAILOR, MD;  Location: [facility];  Service: Cardiovascular;  Laterality: N/A;   AMPUTATION Left 03/25/2024   Procedure: Partial ray amputation, left great toe;  Surgeon: Malvin Marsa FALCON, DPM;  Location: [facility];  Service: Orthopedics/Podiatry;  Laterality: Left;  Left great toe amputation   AMPUTATION TOE Left 11/01/2021   Procedure: AMPUTATION TOE;  Surgeon: Silva Juliene SAUNDERS, DPM;  Location: [facility];  Service: Podiatry;  Laterality: Left;  will do own block   APPLICATION OF WOUND VAC Left 10/30/2021   Procedure: APPLICATION OF WOUND VAC;  Surgeon: Serene Gaile ORN, MD;  Location: [facility];  Service: Vascular;  Laterality:  Left;   BYPASS GRAFT FEMORAL-PERONEAL Left 03/23/2024   Procedure: CREATION, BYPASS, ARTERIAL, COMMON FEMORAL TO PERONEAL, USING GORTEX GRAFT;  Surgeon: Magda Debby SAILOR, MD;  Location: [facility];  Service: Vascular;  Laterality: Left;   BYPASS GRAFT POPLITEAL TO TIBIAL Left 10/30/2021   Procedure: LEFT FEMORAL TO PERONEAL BYPASS GRAFT USING VEIN;  Surgeon: Serene Gaile ORN, MD;  Location: [facility];  Service: Vascular;  Laterality: Left;   CATARACT EXTRACTION Bilateral    CESAREAN SECTION     2   COLONOSCOPY     DILATION AND CURETTAGE OF UTERUS     INCISION AND DRAINAGE OF WOUND Left 06/01/2024   Procedure: IRRIGATION AND DEBRIDEMENT WOUND;  Surgeon: Malvin Marsa FALCON, DPM;  Location: [facility];  Service: Orthopedics/Podiatry;  Laterality: Left;  Partial 1st met resection, I&D, vac   LOWER EXTREMITY ANGIOGRAPHY N/A 03/11/2024   Procedure: Lower Extremity Angiography;  Surgeon: Magda Debby SAILOR, MD;  Location: [facility];  Service: Cardiovascular;  Laterality: N/A;   PERIPHERAL VASCULAR ATHERECTOMY  06/09/2023   Procedure: PERIPHERAL VASCULAR ATHERECTOMY;  Surgeon: Serene Gaile ORN, MD;  Location: [facility];  Service: Cardiovascular;;   PERIPHERAL VASCULAR BALLOON ANGIOPLASTY  09/30/2022   Procedure: PERIPHERAL VASCULAR BALLOON ANGIOPLASTY;  Surgeon: Serene Gaile ORN, MD;  Location: [facility];  Service: Cardiovascular;;   PERIPHERAL VASCULAR BALLOON ANGIOPLASTY Left 06/10/2023   Procedure: PERIPHERAL VASCULAR BALLOON ANGIOPLASTY;  Surgeon: Lanis Fonda BRAVO, MD;  Location: [facility];  Service: Cardiovascular;  Laterality: Left;   PERIPHERAL VASCULAR THROMBECTOMY  06/09/2023   Procedure: PERIPHERAL  VASCULAR THROMBECTOMY;  Surgeon: Serene Gaile ORN, MD;  Location: [facility];  Service: Cardiovascular;;   PERIPHERAL VASCULAR THROMBECTOMY Left 06/10/2023   Procedure: LYSIS RECHECK;  Surgeon: Lanis Fonda BRAVO, MD;  Location: [facility] IN[facility];  Service:  Cardiovascular;  Laterality: Left;   VEIN HARVEST Left 10/30/2021   Procedure: VEIN HARVEST;  Surgeon: Serene Gaile ORN, MD;  Location: [facility] OR;  Service: Vascular;  Laterality: Left;    Family History  Problem Relation Age of Onset   Diabetes Father    Cancer Maternal Grandfather        lung   Cancer Paternal Grandfather        leukemia      Social History   Socioeconomic History   Marital status: Married    Spouse name: Not on file   Number of children: Not on file   Years of education: Not on file   Highest education level: Not on file  Occupational History   Not on file  Tobacco Use   Smoking status: Never   Smokeless tobacco: Never  Vaping Use   Vaping status: Never Used  Substance and Sexual Activity   Alcohol use: Yes    Comment: very rarely   Drug use: No   Sexual activity: Not on file  Other Topics Concern   Not on file  Social History Narrative   Not on file   Social Drivers of Health   Tobacco Use: Low Risk (06/16/2024)   Patient History    Smoking Tobacco Use: Never    Smokeless Tobacco Use: Never    Passive Exposure: Not on file  Financial Resource Strain: Not on file  Food Insecurity: No Food Insecurity (05/31/2024)   Epic    Worried About Programme Researcher, Broadcasting/film/video in the Last Year: Never true    Ran Out of Food in the Last Year: Never true  Transportation Needs: No Transportation Needs (05/31/2024)   Epic    Lack of Transportation (Medical): No    Lack of Transportation (Non-Medical): No  Physical Activity: Not on file  Stress: Not on file  Social Connections: Socially Integrated (05/31/2024)   Social Connection and Isolation Panel    Frequency of Communication with Friends and Family: Three times a week    Frequency of Social Gatherings with Friends and Family: More than three times a week    Attends Religious Services: More than 4 times per year    Active Member of [employer] or Organizations: Yes    Attends Banker Meetings: More than 4 times  per year    Marital Status: Married  Depression (EYV7-0): Not on file  Alcohol Screen: Not on file  Housing: Low Risk (05/31/2024)   Epic    Unable to Pay for Housing in the Last Year: No    Number of Times Moved in the Last Year: 0    Homeless in the Last Year: No  Utilities: Not At Risk (05/31/2024)   Epic    Threatened with loss of utilities: No  Health Literacy: Not on file    Allergies[1]  Current Medications[2]   Review of Systems     Objective:   Physical Exam        Assessment & Plan:       [1] No Known Allergies [2]  Current Outpatient Medications:    aspirin  EC 81 MG tablet, Take 1 tablet (81 mg total) by mouth daily. Swallow whole., Disp: 30 tablet, Rfl: 12   atorvastatin  (  LIPITOR) 40 MG tablet, Take 1 tablet (40 mg total) by mouth at bedtime., Disp: 30 tablet, Rfl: 0   ezetimibe  (ZETIA ) 10 MG tablet, Take 10 mg by mouth daily., Disp: , Rfl:    glimepiride (AMARYL) 2 MG tablet, Take 2 mg by mouth at bedtime., Disp: , Rfl:    Glycerin-Hypromellose-PEG 400 (DRY EYE RELIEF DROPS OP), Place 1 drop into both eyes daily as needed (dry eye)., Disp: , Rfl:    metFORMIN  (GLUCOPHAGE -XR) 500 MG 24 hr tablet, Take 500 mg by mouth daily with breakfast., Disp: , Rfl:    metoprolol  tartrate (LOPRESSOR ) 25 MG tablet, Take 1 tablet (25 mg total) by mouth 2 (two) times daily., Disp: 60 tablet, Rfl: 0   Multiple Vitamins-Minerals (MULTIVITAMIN GUMMIES ADULT PO), Take 2 tablets by mouth daily., Disp: , Rfl:    mupirocin  ointment (BACTROBAN ) 2 %, Apply 1 Application topically daily., Disp: 22 g, Rfl: 0   oxyCODONE -acetaminophen  (PERCOCET/ROXICET) 5-325 MG tablet, Take 1 tablet by mouth every 6 (six) hours as needed for moderate pain (pain score 4-6)., Disp: 12 tablet, Rfl: 0   rivaroxaban  (XARELTO ) 2.5 MG TABS tablet, Take 1 tablet (2.5 mg total) by mouth 2 (two) times daily., Disp: 14 tablet, Rfl: 0  "

## 2024-06-23 ENCOUNTER — Other Ambulatory Visit: Payer: Self-pay

## 2024-06-23 ENCOUNTER — Ambulatory Visit: Admitting: Infectious Disease

## 2024-06-23 VITALS — BP 131/81 | HR 113 | Temp 97.7°F

## 2024-06-23 DIAGNOSIS — E1140 Type 2 diabetes mellitus with diabetic neuropathy, unspecified: Secondary | ICD-10-CM

## 2024-06-23 DIAGNOSIS — I739 Peripheral vascular disease, unspecified: Secondary | ICD-10-CM | POA: Diagnosis not present

## 2024-06-23 DIAGNOSIS — M869 Osteomyelitis, unspecified: Secondary | ICD-10-CM

## 2024-06-23 DIAGNOSIS — C8190 Hodgkin lymphoma, unspecified, unspecified site: Secondary | ICD-10-CM

## 2024-06-23 DIAGNOSIS — A4902 Methicillin resistant Staphylococcus aureus infection, unspecified site: Secondary | ICD-10-CM | POA: Diagnosis not present

## 2024-06-24 LAB — CBC WITH DIFFERENTIAL/PLATELET
Absolute Lymphocytes: 3744 {cells}/uL (ref 850–3900)
Absolute Monocytes: 1152 {cells}/uL — ABNORMAL HIGH (ref 200–950)
Basophils Absolute: 48 {cells}/uL (ref 0–200)
Basophils Relative: 0.4 %
Eosinophils Absolute: 60 {cells}/uL (ref 15–500)
Eosinophils Relative: 0.5 %
HCT: 43 % (ref 35.9–46.0)
Hemoglobin: 14.3 g/dL (ref 11.7–15.5)
MCH: 29.5 pg (ref 27.0–33.0)
MCHC: 33.3 g/dL (ref 31.6–35.4)
MCV: 88.8 fL (ref 81.4–101.7)
MPV: 11.2 fL (ref 7.5–12.5)
Monocytes Relative: 9.6 %
Neutro Abs: 6996 {cells}/uL (ref 1500–7800)
Neutrophils Relative %: 58.3 %
Platelets: 359 10*3/uL (ref 140–400)
RBC: 4.84 Million/uL (ref 3.80–5.10)
RDW: 13.9 % (ref 11.0–15.0)
Total Lymphocyte: 31.2 %
WBC: 12 10*3/uL — ABNORMAL HIGH (ref 3.8–10.8)

## 2024-06-24 LAB — BASIC METABOLIC PANEL WITHOUT GFR
BUN/Creatinine Ratio: 37 (calc) — ABNORMAL HIGH (ref 6–22)
BUN: 21 mg/dL (ref 7–25)
CO2: 27 mmol/L (ref 20–32)
Calcium: 10.2 mg/dL (ref 8.6–10.4)
Chloride: 100 mmol/L (ref 98–110)
Creat: 0.57 mg/dL — ABNORMAL LOW (ref 0.60–1.00)
Glucose, Bld: 197 mg/dL — ABNORMAL HIGH (ref 65–99)
Potassium: 4.3 mmol/L (ref 3.5–5.3)
Sodium: 137 mmol/L (ref 135–146)

## 2024-06-24 LAB — C-REACTIVE PROTEIN: CRP: <3 mg/L

## 2024-06-24 LAB — SEDIMENTATION RATE: Sed Rate: 22 mm/h (ref 0–30)

## 2024-06-28 ENCOUNTER — Ambulatory Visit (INDEPENDENT_AMBULATORY_CARE_PROVIDER_SITE_OTHER): Admitting: Podiatry

## 2024-06-28 DIAGNOSIS — Z9889 Other specified postprocedural states: Secondary | ICD-10-CM

## 2024-06-28 DIAGNOSIS — M86272 Subacute osteomyelitis, left ankle and foot: Secondary | ICD-10-CM

## 2024-06-28 NOTE — Progress Notes (Signed)
"  °  Subjective:  Patient ID: Katie Hawkins, female    DOB: 06-02-1950,  MRN: [id number]  Chief Complaint  Patient presents with   Routine Post Op    Left foot trans met amputation. 2 pain from staples. NIDDM A1C 7.8    DOS: 06/01/2024 Procedure: 1.  Transmetatarsal amputation of left foot 2.  Application amniotic allograft, left foot  74 y.o. female seen for post op check.   Patient is about 4 weeks out from surgery.  Doing well trying to stay off the left foot with the postop shoe.  Has some irritation from the staples which are still intact.  Review of Systems: Negative except as noted in the HPI. Denies N/V/F/Ch.   Objective:   Constitutional Well developed. Well nourished.  Vascular Foot warm and well perfused. Capillary refill normal to all digits.   No calf pain with palpation  Neurologic Normal speech. Oriented to person, place, and time. Epicritic sensation diminished to left foot  Dermatologic Amputation site left foot TMA with improved healing from prior.  Well coapted dry no maceration.  Improved healing from last visit   Orthopedic: Status post left foot TMA   Radiographs: Status post transmetatarsal amputation as expected  Pathology: A. FOOT, LEFT FOREFOOT, AMPUTATION:  Ulcer with acute inflammation and necrosis.  Osteomyelitis.  Osseous margin negative for osteomyelitis   Micro:  MODERATE METHICILLIN RESISTANT STAPHYLOCOCCUS AUREUS  NO ANAEROBES ISOLATED   Assessment:   1. Subacute osteomyelitis of left foot (HCC)   2. Post-operative state      Osteomyelitis and necrotic changes at first ray amputation site status post transmetatarsal amputation left foot  Plan:  Patient was evaluated and treated and all questions answered.  2 week s/p left foot TMA -Progressing as expected postoperatively.  Amputation site continues to improve no evidence of dehiscence or wound breakdown out.  Continue current dressing plans -XR: Expected postoperative changes -WB  Status: Nonweightbearing in postop shoe to left lower extremity -Sutures: Removed all staples at this visit sutures will be removed in the next visit -Medications/ABX: Status post course of p.o. antibiotics on discharge for 5 days no further antibiotics indicated -Dressing: Applied Betadine wet-to-dry dressing today recommend every other day Betadine wet-to-dry dressing to the left foot - F/u Plan: Follow-up in 2 week        Katie Hawkins, DPM [facility] / [facility]  "

## 2024-07-14 ENCOUNTER — Encounter: Admitting: Podiatry

## 2024-07-26 ENCOUNTER — Ambulatory Visit: Admitting: Vascular Surgery

## 2024-07-26 ENCOUNTER — Ambulatory Visit (HOSPITAL_COMMUNITY)
